# Patient Record
Sex: Male | Born: 1945 | Hispanic: Yes | Smoking: Former smoker
Health system: Southern US, Community
[De-identification: ages and names within clinical notes are randomized; demographics above are authoritative.]

## PROBLEM LIST (undated history)

## (undated) DIAGNOSIS — C801 Malignant (primary) neoplasm, unspecified: Secondary | ICD-10-CM

## (undated) DIAGNOSIS — E119 Type 2 diabetes mellitus without complications: Secondary | ICD-10-CM

---

## 2019-08-15 ENCOUNTER — Encounter: Payer: Self-pay | Admitting: Oncology

## 2019-08-15 ENCOUNTER — Other Ambulatory Visit: Payer: Self-pay | Admitting: Oncology

## 2019-08-25 ENCOUNTER — Emergency Department (HOSPITAL_COMMUNITY)
Admission: EM | Admit: 2019-08-25 | Discharge: 2019-08-25 | Disposition: A | Discharge status: Home / Self Care | Payer: Self-pay | Attending: Emergency Medicine | Admitting: Emergency Medicine

## 2019-08-25 ENCOUNTER — Other Ambulatory Visit: Payer: Self-pay | Admitting: Oncology

## 2019-08-25 ENCOUNTER — Encounter (HOSPITAL_COMMUNITY): Payer: Self-pay | Admitting: Emergency Medicine

## 2019-08-25 ENCOUNTER — Other Ambulatory Visit: Payer: Self-pay

## 2019-08-25 ENCOUNTER — Emergency Department (HOSPITAL_COMMUNITY): Payer: Self-pay

## 2019-08-25 DIAGNOSIS — C169 Malignant neoplasm of stomach, unspecified: Secondary | ICD-10-CM | POA: Insufficient documentation

## 2019-08-25 DIAGNOSIS — Z87891 Personal history of nicotine dependence: Secondary | ICD-10-CM | POA: Insufficient documentation

## 2019-08-25 DIAGNOSIS — Z794 Long term (current) use of insulin: Secondary | ICD-10-CM | POA: Insufficient documentation

## 2019-08-25 DIAGNOSIS — Z8507 Personal history of malignant neoplasm of pancreas: Secondary | ICD-10-CM | POA: Insufficient documentation

## 2019-08-25 DIAGNOSIS — E119 Type 2 diabetes mellitus without complications: Secondary | ICD-10-CM | POA: Insufficient documentation

## 2019-08-25 HISTORY — DX: Malignant (primary) neoplasm, unspecified: C80.1

## 2019-08-25 HISTORY — DX: Type 2 diabetes mellitus without complications: E11.9

## 2019-08-25 LAB — CBC WITH DIFFERENTIAL/PLATELET
Abs Immature Granulocytes: 0.05 10*3/uL (ref 0.00–0.07)
Basophils Absolute: 0 10*3/uL (ref 0.0–0.1)
Basophils Relative: 1 %
Eosinophils Absolute: 0.2 10*3/uL (ref 0.0–0.5)
Eosinophils Relative: 2 %
HCT: 38.8 % — ABNORMAL LOW (ref 39.0–52.0)
Hemoglobin: 12.5 g/dL — ABNORMAL LOW (ref 13.0–17.0)
Immature Granulocytes: 1 %
Lymphocytes Relative: 23 %
Lymphs Abs: 2 10*3/uL (ref 0.7–4.0)
MCH: 27.7 pg (ref 26.0–34.0)
MCHC: 32.2 g/dL (ref 30.0–36.0)
MCV: 85.8 fL (ref 80.0–100.0)
Monocytes Absolute: 0.8 10*3/uL (ref 0.1–1.0)
Monocytes Relative: 9 %
Neutro Abs: 5.6 10*3/uL (ref 1.7–7.7)
Neutrophils Relative %: 64 %
Platelets: 653 10*3/uL — ABNORMAL HIGH (ref 150–400)
RBC: 4.52 MIL/uL (ref 4.22–5.81)
RDW: 13.5 % (ref 11.5–15.5)
WBC: 8.7 10*3/uL (ref 4.0–10.5)
nRBC: 0 % (ref 0.0–0.2)

## 2019-08-25 LAB — COMPREHENSIVE METABOLIC PANEL
ALT: 15 U/L (ref 0–44)
AST: 35 U/L (ref 15–41)
Albumin: 3.1 g/dL — ABNORMAL LOW (ref 3.5–5.0)
Alkaline Phosphatase: 127 U/L — ABNORMAL HIGH (ref 38–126)
Anion gap: 8 (ref 5–15)
BUN: 12 mg/dL (ref 8–23)
CO2: 27 mmol/L (ref 22–32)
Calcium: 8.9 mg/dL (ref 8.9–10.3)
Chloride: 98 mmol/L (ref 98–111)
Creatinine, Ser: 0.7 mg/dL (ref 0.61–1.24)
GFR calc Af Amer: >60 mL/min (ref >60)
GFR calc non Af Amer: >60 mL/min (ref >60)
Glucose, Bld: 132 mg/dL — ABNORMAL HIGH (ref 70–99)
Potassium: 5.2 mmol/L — ABNORMAL HIGH (ref 3.5–5.1)
Sodium: 133 mmol/L — ABNORMAL LOW (ref 135–145)
Total Bilirubin: 0.7 mg/dL (ref 0.3–1.2)
Total Protein: 7.1 g/dL (ref 6.5–8.1)

## 2019-08-25 LAB — LIPASE, BLOOD: Lipase: 38 U/L (ref 11–51)

## 2019-08-25 MED ORDER — SODIUM CHLORIDE (PF) 0.9 % IJ SOLN
INTRAMUSCULAR | Status: AC
  Filled 2019-08-25: qty 50

## 2019-08-25 MED ORDER — SODIUM CHLORIDE 0.9 % IV BOLUS
1000.0000 mL | Freq: Once | INTRAVENOUS | Status: AC
  Administered 2019-08-25: 12:00:00 1000 mL via INTRAVENOUS

## 2019-08-25 MED ORDER — OXYCODONE HCL 5 MG PO TABS: 5.0000 mg | ORAL_TABLET | Freq: Four times a day (QID) | ORAL | 0 refills | Status: DC | PRN

## 2019-08-25 MED ORDER — MORPHINE SULFATE (PF) 4 MG/ML IV SOLN
4.0000 mg | Freq: Once | INTRAVENOUS | Status: AC
  Administered 2019-08-25: 4 mg via INTRAVENOUS
  Filled 2019-08-25: qty 1

## 2019-08-25 MED ORDER — ONDANSETRON HCL 4 MG PO TABS: 4.0000 mg | ORAL_TABLET | Freq: Four times a day (QID) | ORAL | 0 refills | Status: AC

## 2019-08-25 MED ORDER — IOHEXOL 300 MG/ML  SOLN
100.0000 mL | Freq: Once | INTRAMUSCULAR | Status: AC | PRN
  Administered 2019-08-25: 100 mL via INTRAVENOUS

## 2019-08-25 NOTE — Discharge Instructions (Addendum)
Oncology will call you for an appointment this week.  However please give me a phone call if you have not heard back from them.  Numbers attached below.  Please use Tylenol for pain.  Use Roxicodone for breakthrough pain.  Use Zofran for nausea.  These return to the ED for symptoms worsen.

## 2019-08-25 NOTE — ED Triage Notes (Signed)
Pt speaks Spanish used [place]. 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 Didn't receive chemo bc the doctors there said they couldn't.  Came to [place] this past weekend where family is and not getting any treatment in [place].  Pt c/o abd pains with palpation and movement.

## 2019-08-25 NOTE — ED Provider Notes (Signed)
[facility] DEPT Provider Note   CSN: [id number] Arrival date & time: 08/25/19  [id number]     History Chief Complaint  Patient presents with  . Abdominal Pain    Alexander Lawson is a 74 y.o. male.  The history is provided by the patient.  Abdominal Pain Pain location:  Epigastric Pain quality: aching   Pain radiates to:  Does not radiate Pain severity:  Mild Onset quality:  Gradual Timing:  Intermittent Progression:  Waxing and waning Chronicity:  New Context comment:  Diagnosed with gastric cancer per paperwork but patient says pancreatic cancer. Diagnosed last week.  Relieved by:  Nothing Worsened by:  Nothing Associated symptoms: no chest pain, no chills, no constipation, no cough, no diarrhea, no dysuria, no fever, no hematuria, no nausea, no shortness of breath, no sore throat and no vomiting        Past Medical History:  Diagnosis Date  . Cancer ([place])    pancreas  . Diabetes mellitus without complication ([place])     There are no problems to display for this patient.   History reviewed. No pertinent surgical history.     No family history on file.  Social History   Tobacco Use  . Smoking status: Former Research scientist (life sciences)  . Smokeless tobacco: Never Used  Substance Use Topics  . Alcohol use: Not Currently  . Drug use: Not on file    Home Medications Prior to Admission medications   Medication Sig Start Date End Date Taking? Authorizing Provider  insulin NPH Human (NOVOLIN N) 100 UNIT/ML injection Inject 5-10 Units into the skin daily as needed (5 units if BS over 150/10 units if BS over 200).   Yes [provider]  oxymetazoline (AFRIN) 0.05 % nasal spray Place 1 spray into both nostrils 2 (two) times daily as needed for congestion.   Yes [provider]  ondansetron (ZOFRAN) 4 MG tablet Take 1 tablet (4 mg total) by mouth every 6 (six) hours for 20 doses. 08/25/19 08/30/19  Elea Holtzclaw, DO  oxyCODONE (ROXICODONE) 5  MG immediate release tablet Take 1 tablet (5 mg total) by mouth every 6 (six) hours as needed for up to 20 doses for severe pain. 08/25/19   Lennice Sites, DO    Allergies    Patient has no known allergies.  Review of Systems   Review of Systems  Constitutional: Negative for chills and fever.  HENT: Negative for ear pain and sore throat.   Eyes: Negative for pain and visual disturbance.  Respiratory: Negative for cough and shortness of breath.   Cardiovascular: Negative for chest pain and palpitations.  Gastrointestinal: Positive for abdominal pain. Negative for abdominal distention, anal bleeding, blood in stool, constipation, diarrhea, nausea, rectal pain and vomiting.  Genitourinary: Negative for dysuria and hematuria.  Musculoskeletal: Negative for arthralgias and back pain.  Skin: Negative for color change and rash.  Neurological: Negative for seizures and syncope.  All other systems reviewed and are negative.   Physical Exam Updated Vital Signs  ED Triage Vitals [08/25/19 0928]  Enc Vitals Group     BP 114/73     Pulse Rate 97     Resp 14     Temp 98.1 F (36.7 C)     Temp Source Oral     SpO2 98 %     Weight 140 lb (63.5 kg)     Height      Head Circumference      Peak Flow  Pain Score      Pain Loc      Pain Edu?      Excl. in [place]?     Physical Exam Vitals and nursing note reviewed.  Constitutional:      General: He is not in acute distress.    Appearance: He is well-developed. He is not ill-appearing.  HENT:     Head: Normocephalic and atraumatic.  Eyes:     Extraocular Movements: Extraocular movements intact.     Conjunctiva/sclera: Conjunctivae normal.     Pupils: Pupils are equal, round, and reactive to light.  Cardiovascular:     Rate and Rhythm: Normal rate and regular rhythm.     Heart sounds: Normal heart sounds. No murmur.  Pulmonary:     Effort: Pulmonary effort is normal. No respiratory distress.     Breath sounds: Normal breath sounds.   Abdominal:     Palpations: Abdomen is soft.     Tenderness: There is abdominal tenderness in the epigastric area.  Musculoskeletal:     Cervical back: Neck supple.  Skin:    General: Skin is warm and dry.     Capillary Refill: Capillary refill takes less than 2 seconds.  Neurological:     General: No focal deficit present.     Mental Status: He is alert.     ED Results / Procedures / Treatments   Labs (all labs ordered are listed, but only abnormal results are displayed) Labs Reviewed  CBC WITH DIFFERENTIAL/PLATELET - Abnormal; Notable for the following components:      Result Value   Hemoglobin 12.5 (*)    HCT 38.8 (*)    Platelets 653 (*)    All other components within normal limits  COMPREHENSIVE METABOLIC PANEL - Abnormal; Notable for the following components:   Sodium 133 (*)    Potassium 5.2 (*)    Glucose, Bld 132 (*)    Albumin 3.1 (*)    Alkaline Phosphatase 127 (*)    All other components within normal limits  LIPASE, BLOOD    EKG EKG Interpretation  Date/Time:  Monday August 25 2019 11:36:03 EDT Ventricular Rate:  79 PR Interval:    QRS Duration: 97 QT Interval:  401 QTC Calculation: 460 R Axis:   -24 Text Interpretation: Sinus rhythm Borderline left axis deviation Confirmed by Lennice Sites [phone]) on 08/25/2019 11:39:38 AM   Radiology CT ABDOMEN PELVIS W CONTRAST  Result Date: 08/25/2019 CLINICAL DATA:  Abdominal distension and pain. Recently diagnosed with pancreatic cancer in [place]. No reported therapy. History of diabetes. EXAM: CT ABDOMEN AND PELVIS WITH CONTRAST TECHNIQUE: Multidetector CT imaging of the abdomen and pelvis was performed using the standard protocol following bolus administration of intravenous contrast. CONTRAST:  126mL OMNIPAQUE IOHEXOL 300 MG/ML  SOLN COMPARISON:  None. FINDINGS: Lower chest: Mild linear atelectasis or scarring at both lung bases. There is a calcified granuloma in the lingula. No significant pleural or  pericardial effusion. Hepatobiliary: There are multiple metastases throughout all segments of the liver which demonstrate early peripheral enhancement. Representative lesions on series 7 include a 3.2 cm lesion in the anterior dome of the right lobe on image 14, a 3.4 cm lesion in the far lateral segment of the left lobe on image 19, a 4.3 cm lesion in the left lobe on image 21 and a 2.1 cm lesion more inferiorly in the right lobe on image 40. no evidence of gallstones, gallbladder wall thickening or biliary dilatation. Pancreas: There is a very large,  centrally necrotic mass in the left upper quadrant of the abdomen. This measures 12.2 x 9.0 x 8.6 cm and has thick irregular enhancing walls, central low-density and multiple central air bubbles. This mass is located between the liver, stomach and pancreas, and appears inseparable from the lesser curvature of the stomach. This mass also abuts and inferiorly displaces the pancreatic body and tail, but is not favored to represent a primary pancreatic process. There is no pancreatic ductal dilatation. Spleen: Normal in size without focal abnormality. Adrenals/Urinary Tract: Both adrenal glands appear normal. The kidneys appear normal without evidence of urinary tract calculus, suspicious lesion or hydronephrosis. No bladder abnormalities are seen. Stomach/Bowel: As above, large centrally necrotic left upper quadrant mass along the lesser curvature of the stomach, favored to reflect a primary gastric malignancy (likely gastrointestinal stromal tumor). The air within this lesion may be secondary to central necrosis and/or communication with the gastric lumen. The small bowel, appendix and colon appear normal. There is moderate stool throughout the colon. No evidence of bowel obstruction or perforation. Vascular/Lymphatic: There are no enlarged abdominal or pelvic lymph nodes. There are multiple small lymph nodes in the gastrohepatic ligament and retroperitoneum which are  not pathologically enlarged. Mild aortic and branch vessel atherosclerosis. No vascular encasement. A metastasis within the caudate lobe of the liver exerts mild mass effect on the IVC which remains patent. The portal, superior mesenteric, splenic and hepatic veins are patent. Reproductive: Mild enlargement of the prostate gland. Other: Intact anterior abdominal wall. No ascites or peritoneal nodularity. Musculoskeletal: No acute or significant osseous findings. Mild lumbar spondylosis. IMPRESSION: 1. 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 This mass is located between the liver, stomach and pancreas but is not favored to arise from the pancreas. Gas within the lesion may be secondary necrosis or communication with the gastric lumen. 2. Multiple hepatic metastases. Numerous prominent lymph nodes in the upper abdomen, not pathologically enlarged. 3. No evidence of bowel obstruction or perforation. 4. Aortic Atherosclerosis (ICD10-I70.0). Electronically Signed   By: Richardean Sale M.D.   On: 08/25/2019 13:29    Procedures Procedures (including critical care time)  Medications Ordered in ED Medications  sodium chloride (PF) 0.9 % injection (has no administration in time range)  sodium chloride 0.9 % bolus 1,000 mL (0 mLs Intravenous Stopped 08/25/19 1406)  morphine 4 MG/ML injection 4 mg (4 mg Intravenous Given 08/25/19 1150)  iohexol (OMNIPAQUE) 300 MG/ML solution 100 mL (100 mLs Intravenous Contrast Given 08/25/19 1248)    ED Course  I have reviewed the triage vital signs and the nursing notes.  Pertinent labs & imaging results that were available during my care of the patient were reviewed by me and considered in my medical decision making (see chart for details).    MDM Rules/Calculators/A&P                      Alexander Lawson is a 74 year old male with history of diabetes who presents to  the ED with abdominal pain.  Patient with normal vitals.  No fever.  Now relocating from [place].  Has family here in [place].  He was diagnosed last week with according to paperwork that he has with him gastric cancer.  He does not have a treatment plan.  He sounds like he might have been seen by an oncologist who thought about radiation treatment but it sounds like patient has  moved here and prefers treatment locally.  Is able to tolerate liquids but continues to have difficulty eating solids.  Overall he appears well.  Has some mild abdominal tenderness on exam.  We will get basic labs, CT of abdomen and pelvis.  We will look for any electrolyte abnormalities or dehydration.  Will touch base with oncology.  Patient with confirmed intra-abdominal mass.  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  There appears to be hepatic metastasis.  Otherwise lab work is overall unremarkable.  Talked with Dr. Jana Hakim with oncology who will arrange for close follow-up this week.  Patient given prescription for oxycodone and Zofran.  Understands return precautions and discharged in the ED in good condition.  This chart was dictated using voice recognition software.  Despite best efforts to proofread,  errors can occur which can change the documentation meaning.    Final Clinical Impression(s) / ED Diagnoses Final diagnoses:  Malignant neoplasm of stomach, unspecified location [facility])    Rx / DC Orders ED Discharge Orders         Ordered    oxyCODONE (ROXICODONE) 5 MG immediate release tablet  Every 6 hours PRN     08/25/19 1359    ondansetron (ZOFRAN) 4 MG tablet  Every 6 hours     08/25/19 [place], [place], DO 08/25/19 1443

## 2019-08-25 NOTE — ED Notes (Signed)
Pt transported to CT ?

## 2019-08-29 ENCOUNTER — Encounter: Payer: Self-pay | Admitting: Nurse Practitioner

## 2019-08-29 ENCOUNTER — Inpatient Hospital Stay: Payer: Self-pay | Attending: Nurse Practitioner | Admitting: Nurse Practitioner

## 2019-08-29 ENCOUNTER — Other Ambulatory Visit: Payer: Self-pay

## 2019-08-29 ENCOUNTER — Encounter: Payer: Self-pay | Admitting: General Practice

## 2019-08-29 VITALS — BP 97/69 | HR 106 | Temp 99.2°F | Resp 20 | Ht 64.0 in | Wt 122.4 lb

## 2019-08-29 DIAGNOSIS — K769 Liver disease, unspecified: Secondary | ICD-10-CM | POA: Insufficient documentation

## 2019-08-29 DIAGNOSIS — E119 Type 2 diabetes mellitus without complications: Secondary | ICD-10-CM | POA: Insufficient documentation

## 2019-08-29 DIAGNOSIS — K1231 Oral mucositis (ulcerative) due to antineoplastic therapy: Secondary | ICD-10-CM | POA: Insufficient documentation

## 2019-08-29 DIAGNOSIS — G893 Neoplasm related pain (acute) (chronic): Secondary | ICD-10-CM | POA: Insufficient documentation

## 2019-08-29 DIAGNOSIS — C169 Malignant neoplasm of stomach, unspecified: Secondary | ICD-10-CM | POA: Insufficient documentation

## 2019-08-29 DIAGNOSIS — R1310 Dysphagia, unspecified: Secondary | ICD-10-CM | POA: Insufficient documentation

## 2019-08-29 DIAGNOSIS — C160 Malignant neoplasm of cardia: Secondary | ICD-10-CM | POA: Insufficient documentation

## 2019-08-29 DIAGNOSIS — R531 Weakness: Secondary | ICD-10-CM | POA: Insufficient documentation

## 2019-08-29 DIAGNOSIS — R634 Abnormal weight loss: Secondary | ICD-10-CM | POA: Insufficient documentation

## 2019-08-29 DIAGNOSIS — Z7189 Other specified counseling: Secondary | ICD-10-CM | POA: Insufficient documentation

## 2019-08-29 DIAGNOSIS — R160 Hepatomegaly, not elsewhere classified: Secondary | ICD-10-CM | POA: Insufficient documentation

## 2019-08-29 DIAGNOSIS — R1902 Left upper quadrant abdominal swelling, mass and lump: Secondary | ICD-10-CM | POA: Insufficient documentation

## 2019-08-29 DIAGNOSIS — Z5111 Encounter for antineoplastic chemotherapy: Secondary | ICD-10-CM | POA: Insufficient documentation

## 2019-08-29 DIAGNOSIS — R630 Anorexia: Secondary | ICD-10-CM | POA: Insufficient documentation

## 2019-08-29 MED ORDER — HYDROCODONE-ACETAMINOPHEN 7.5-325 MG/15ML PO SOLN: 10.0000 mL | Freq: Four times a day (QID) | ORAL | 0 refills | Status: DC | PRN

## 2019-08-29 MED FILL — HYDROCOD-APAP 7.5-325/15ML: 7.5-325 | 3 days supply | Qty: 120 | Fill #0

## 2019-08-29 NOTE — Progress Notes (Signed)
Met with patient and his daughter in [employer] at initial medical oncology appointment with Ned Card NP and Dr. Benay Spice.  Almyra Free in house interpreter present for visit. They were given my direct phone number and I explained my role as GI nurse navigator.  I had the patient sign of release of information and faxed it to [facility] requesting all of his records from 4/1 through 4/7 be faxed to [place] urgently.  The patient was given some samples of Ensure and coupons to obtain.

## 2019-08-29 NOTE — Progress Notes (Addendum)
New Hematology/Oncology Consult   Requesting MD: Dr. Ronnald Nian  N/A  Reason for Consult: Abdominal mass, liver lesions  HPI: [name] is a 74 year old man who recently located to this area from [place].  He does not speak [place].  He is accompanied by his daughter and a daughter-in-law.  An interpreter is also present for today's visit.  He reports going to [place] a few weeks ago with fever, sweats and abdominal pain.  His daughter describes what sounds like an upper endoscopy with a mass identified.  She thinks a biopsy was performed.  They were told he had stomach cancer.  The records they brought to today's visit include discharge instructions with a discharge diagnosis listed as gastric adenocarcinoma.  There is no biopsy report.  Date of discharge is listed as 08/20/2019.  He states he was unable receive treatment in [place] due to a lack of insurance.   He presented to the emergency department at [facility] on 08/25/2019 with abdominal pain.  CT abdomen/pelvis showed a large centrally necrotic mass in the left upper quadrant of the abdomen located between the liver, stomach and pancreas.  Mass not favored to arise from the pancreas.  There was gas within the lesion possibly secondary to necrosis or communication with the gastric lumen.  Multiple liver lesions were noted as well as numerous prominent lymph nodes in the upper abdomen though not pathologically enlarged.     Past Medical History:  Diagnosis Date  . Cancer ([place])    pancreas  . Diabetes mellitus without complication ([place])   :  History reviewed. No pertinent surgical history.:   Current Outpatient Medications:  .  insulin NPH Human (NOVOLIN N) 100 UNIT/ML injection, Inject 5-10 Units into the skin daily as needed (5 units if BS over 150/10 units if BS over 200)., Disp: , Rfl:  .  ondansetron (ZOFRAN) 4 MG tablet, Take 1 tablet (4 mg total) by mouth every 6 (six) hours for 20 doses., Disp: 20 tablet,  Rfl: 0 .  oxyCODONE (ROXICODONE) 5 MG immediate release tablet, Take 1 tablet (5 mg total) by mouth every 6 (six) hours as needed for up to 20 doses for severe pain., Disp: 20 tablet, Rfl: 0 .  oxymetazoline (AFRIN) 0.05 % nasal spray, Place 1 spray into both nostrils 2 (two) times daily as needed for congestion., Disp: , Rfl: :   No Known Allergies:  FH: No family history of cancer.  SOCIAL HISTORY: He is a widow.  He is currently living in [place] with his daughter.  He has 10 children.  He previously worked driving a truck.  Remote history of tobacco and EtOH use ages [age].  None since then.  Review of Systems: He reports progressive weakness.  Loss of appetite.  Estimates weight loss of 20 pounds.  A year ago he thinks he weighed between 160 and 174 pounds.  No recent fever.  He is having upper abdominal pain.  He has had pain with swallowing as well as difficulty swallowing solids for several weeks.  He is tolerating liquids.  He was prescribed pain medication but does not tolerate pills.  He would like a liquid pain medication.  He notes an alteration in taste.  No unusual headaches.  He notes visual disturbance due to diabetes.  No cough.  He has occasional shortness of breath with deep breathing.  Bowels moving normally.  Mild numbness in the feet.  No numbness in the hands.  Physical Exam:  Blood  pressure 97/69, pulse (!) 106, temperature 99.2 F (37.3 C), temperature source Temporal, resp. rate 20, weight 122 lb 6.4 oz (55.5 kg), SpO2 100 %.  HEENT: Neck without mass.  Sclera anicteric. Lungs: Lungs clear bilaterally. Cardiac: Regular rate and rhythm. Abdomen: Masslike fullness left upper abdomen.  No hepatomegaly.  Vascular: Leg edema. Lymph nodes: No palpable cervical, supraclavicular, inguinal lymph nodes.  Shotty bilateral axillary nodes. Skin: Warm and dry.  LABS:  No results for input(s): WBC, HGB, HCT, PLT in the last 72 hours.  No results for input(s): NA, K, CL,  CO2, GLUCOSE, BUN, CREATININE, CALCIUM in the last 72 hours.    RADIOLOGY:  CT ABDOMEN PELVIS W CONTRAST  Result Date: 08/25/2019 CLINICAL DATA:  Abdominal distension and pain. Recently diagnosed with pancreatic cancer in [place]. No reported therapy. History of diabetes. EXAM: CT ABDOMEN AND PELVIS WITH CONTRAST TECHNIQUE: Multidetector CT imaging of the abdomen and pelvis was performed using the standard protocol following bolus administration of intravenous contrast. CONTRAST:  185m OMNIPAQUE IOHEXOL 300 MG/ML  SOLN COMPARISON:  None. FINDINGS: Lower chest: Mild linear atelectasis or scarring at both lung bases. There is a calcified granuloma in the lingula. No significant pleural or pericardial effusion. Hepatobiliary: There are multiple metastases throughout all segments of the liver which demonstrate early peripheral enhancement. Representative lesions on series 7 include a 3.2 cm lesion in the anterior dome of the right lobe on image 14, a 3.4 cm lesion in the far lateral segment of the left lobe on image 19, a 4.3 cm lesion in the left lobe on image 21 and a 2.1 cm lesion more inferiorly in the right lobe on image 40. no evidence of gallstones, gallbladder wall thickening or biliary dilatation. Pancreas: There is a very large, centrally necrotic mass in the left upper quadrant of the abdomen. This measures 12.2 x 9.0 x 8.6 cm and has thick irregular enhancing walls, central low-density and multiple central air bubbles. This mass is located between the liver, stomach and pancreas, and appears inseparable from the lesser curvature of the stomach. This mass also abuts and inferiorly displaces the pancreatic body and tail, but is not favored to represent a primary pancreatic process. There is no pancreatic ductal dilatation. Spleen: Normal in size without focal abnormality. Adrenals/Urinary Tract: Both adrenal glands appear normal. The kidneys appear normal without evidence of urinary tract calculus,  suspicious lesion or hydronephrosis. No bladder abnormalities are seen. Stomach/Bowel: As above, large centrally necrotic left upper quadrant mass along the lesser curvature of the stomach, favored to reflect a primary gastric malignancy (likely gastrointestinal stromal tumor). The air within this lesion may be secondary to central necrosis and/or communication with the gastric lumen. The small bowel, appendix and colon appear normal. There is moderate stool throughout the colon. No evidence of bowel obstruction or perforation. Vascular/Lymphatic: There are no enlarged abdominal or pelvic lymph nodes. There are multiple small lymph nodes in the gastrohepatic ligament and retroperitoneum which are not pathologically enlarged. Mild aortic and branch vessel atherosclerosis. No vascular encasement. A metastasis within the caudate lobe of the liver exerts mild mass effect on the IVC which remains patent. The portal, superior mesenteric, splenic and hepatic veins are patent. Reproductive: Mild enlargement of the prostate gland. Other: Intact anterior abdominal wall. No ascites or peritoneal nodularity. Musculoskeletal: No acute or significant osseous findings. Mild lumbar spondylosis. IMPRESSION: 1. 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 This  mass is located between the liver, stomach and pancreas but is not favored to arise from the pancreas. Gas within the lesion may be secondary necrosis or communication with the gastric lumen. 2. Multiple hepatic metastases. Numerous prominent lymph nodes in the upper abdomen, not pathologically enlarged. 3. No evidence of bowel obstruction or perforation. 4. Aortic Atherosclerosis (ICD10-I70.0). Electronically Signed   By: Richardean Sale M.D.   On: 08/25/2019 13:29    Assessment and Plan:   1. 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  Multiple liver lesions throughout all segments.  Metastasis within the caudate lobe of the liver exerts mild mass-effect on the IVC which remains patent.  Multiple small lymph nodes in the gastrohepatic ligament and retroperitoneum, not pathologically enlarged. 2. Abdominal pain secondary to #1  3. Dysphagia/odynophagia secondary to #1  4. Anorexia/weight loss secondary to #1  5. Diabetes  Mr. Bushong is a 74 year old man recently relocated to [facility] from [place] where he was found to have a large abdominal mass and liver lesions.  We are trying to obtain the records from [facility] [place].  We are referring him for urgent biopsy of a liver lesion and also placement of a PICC line in anticipation of chemotherapy.  His performance status is poor.  He has lost a significant amount of weight.  He is symptomatic with dysphagia/odynophagia and abdominal pain.  A prescription was sent to his pharmacy for hydrocodone liquid.  We made a referral to the [place] dietitian.  He will return for a follow-up appointment on 09/02/2019 to establish a treatment plan.   Addendum -after leaving the office we received the pathology report from [place].  Biopsy of a gastric cardia mass showed poorly differentiated adenocarcinoma, HER-2 negative, PD1 combined positive score 3.  We canceled the liver biopsy and PICC placement.  He is scheduled for placement of a Port-A-Cath 09/03/2019.  Patient seen with Dr. Benay Spice.  Ned Card, NP 08/29/2019, 11:41 AM   This was a  shared visit with Ned Card.  Mr. Meikle was interviewed and examined.  He presents with dysphagia, weight loss, and abdominal pain.  He underwent an upper endoscopy in [place] with biopsy of the gastric cardia mass confirming adenocarcinoma, HER-2 negative.  We are attempting to obtain full records from the hospital admission to [facility].  Mr. Wintle is symptomatic with pain, odynophagia, dysphagia, and weight loss.  The plan is to begin FOLFOX chemotherapy next week.  Julieanne Manson, MD

## 2019-08-29 NOTE — Progress Notes (Signed)
[facility] CSW Progress Notes  First $50 [employer] provided to patient.  Edwyna Shell, LCSW Clinical Social Worker Phone:  [phone]

## 2019-08-29 NOTE — Progress Notes (Signed)
Received referral from nurse on behalf of provider regarding financial assistance.  Once treatment plan has been established, I will reach out to patient regarding all available resources.  Discovered rx for oral meds placed today.  Sent message to social work to provide visa cards through [employer] to pay for meds. Webb Silversmith responded back and was able to provide. Made Julie(interpreter) aware and she was able to get for patient. Advised her to inform patient I would be meeting with him in person once his plan has been established/chemo ed. They verbalized understanding.

## 2019-08-29 NOTE — Progress Notes (Signed)
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

## 2019-09-01 ENCOUNTER — Other Ambulatory Visit (HOSPITAL_COMMUNITY): Payer: Self-pay

## 2019-09-01 ENCOUNTER — Ambulatory Visit (HOSPITAL_COMMUNITY): Payer: Self-pay

## 2019-09-01 ENCOUNTER — Other Ambulatory Visit: Payer: Self-pay | Admitting: Oncology

## 2019-09-01 DIAGNOSIS — C160 Malignant neoplasm of cardia: Secondary | ICD-10-CM

## 2019-09-01 DIAGNOSIS — R1902 Left upper quadrant abdominal swelling, mass and lump: Secondary | ICD-10-CM

## 2019-09-01 NOTE — Progress Notes (Signed)
ON PATHWAY REGIMEN - Gastroesophageal  No Change  Continue With Treatment as Ordered.     A cycle is every 14 days:     Oxaliplatin      Leucovorin      Fluorouracil      Fluorouracil   **Always confirm dose/schedule in your pharmacy ordering system**  Patient Characteristics: Distant Metastases (cM1/pM1) / Locally Recurrent Disease, Adenocarcinoma - Esophageal, GE Junction, and Gastric, First Line, HER2 Negative/Unknown, PD?L1 Expression  CPS < 5/Negative/Unknown Histology: Adenocarcinoma Disease Classification: Gastric Therapeutic Status: Distant Metastases (No Additional Staging) Line of Therapy: First Line HER2 Status: Negative PD-L1 Expression Status: PD-L1 Expression CPS < 5 Intent of Therapy: Non-Curative / Palliative Intent, Discussed with Patient

## 2019-09-02 ENCOUNTER — Inpatient Hospital Stay: Payer: Self-pay

## 2019-09-02 ENCOUNTER — Other Ambulatory Visit: Payer: Self-pay

## 2019-09-02 ENCOUNTER — Inpatient Hospital Stay (HOSPITAL_BASED_OUTPATIENT_CLINIC_OR_DEPARTMENT_OTHER): Payer: Self-pay | Admitting: Oncology

## 2019-09-02 ENCOUNTER — Other Ambulatory Visit: Payer: Self-pay | Admitting: Radiology

## 2019-09-02 ENCOUNTER — Encounter: Payer: Self-pay | Admitting: Oncology

## 2019-09-02 VITALS — BP 98/71 | HR 111 | Temp 98.9°F | Resp 16 | Ht 68.5 in | Wt 121.0 lb

## 2019-09-02 DIAGNOSIS — C160 Malignant neoplasm of cardia: Secondary | ICD-10-CM

## 2019-09-02 MED ORDER — PROCHLORPERAZINE MALEATE 10 MG PO TABS: 10.0000 mg | ORAL_TABLET | Freq: Four times a day (QID) | ORAL | 1 refills | Status: DC | PRN

## 2019-09-02 MED ORDER — LIDOCAINE-PRILOCAINE 2.5-2.5 % EX CREA: 1.0000 "application " | TOPICAL_CREAM | CUTANEOUS | 0 refills | Status: AC

## 2019-09-02 MED FILL — LIDOCAINE-PRILOCAINE CREAM: 2.5-2.5 | 5 days supply | Qty: 30 | Fill #0

## 2019-09-02 MED FILL — PROCHLORPERAZINE 10 MG TAB: 10 | 11 days supply | Qty: 45 | Fill #0

## 2019-09-02 NOTE — Progress Notes (Signed)
  [facility] OFFICE PROGRESS NOTE   Diagnosis: Gastric cancer  INTERVAL HISTORY:   Alexander Lawson returns as scheduled.  He is here today with his daughter and a Spanish interpreter.  He continues to have dysphagia, upper abdominal discomfort, and he reports "a hot feeling "when he has a bowel movement.  He is tolerating liquids.  He attended a chemotherapy class today.  Objective:  Vital signs in last 24 hours:  Blood pressure 98/71, pulse (!) 111, temperature 98.9 F (37.2 C), temperature source Temporal, resp. rate 16, height 5' 8.5" (1.74 m), weight 121 lb (54.9 kg), SpO2 100 %.   Physical examination-not performed today  Lab Results:  Lab Results  Component Value Date   WBC 8.7 08/25/2019   HGB 12.5 (L) 08/25/2019   HCT 38.8 (L) 08/25/2019   MCV 85.8 08/25/2019   PLT 653 (H) 08/25/2019   NEUTROABS 5.6 08/25/2019    CMP  Lab Results  Component Value Date   NA 133 (L) 08/25/2019   K 5.2 (H) 08/25/2019   CL 98 08/25/2019   CO2 27 08/25/2019   GLUCOSE 132 (H) 08/25/2019   BUN 12 08/25/2019   CREATININE 0.70 08/25/2019   CALCIUM 8.9 08/25/2019   PROT 7.1 08/25/2019   ALBUMIN 3.1 (L) 08/25/2019   AST 35 08/25/2019   ALT 15 08/25/2019   ALKPHOS 127 (H) 08/25/2019   BILITOT 0.7 08/25/2019   GFRNONAA >60 08/25/2019   GFRAA >60 08/25/2019     Medications: I have reviewed the patient's current medications.   Assessment/Plan:  1. Metastatic gastric cancer  Biopsy gastric cardiac mass 08/15/2019-adenocarcinoma, poorly differentiated; HER-2 negative, PD1 combined positive score 3  CT abdomen/pelvis August 25, 2019-large centrally necrotic mass left upper quadrant of the abdomen measuring 12.2 x 9.0 x 8.6 cm, thick irregular enhancing walls, central low density and multiple central air bubbles; the mass is located between the liver, stomach and pancreas and appears inseparable from the lesser curvature of the stomach.  Multiple liver lesions throughout  all segments.  Metastasis within the caudate lobe of the liver exerts mild mass-effect on the IVC which remains patent.  Multiple small lymph nodes in the gastrohepatic ligament and retroperitoneum, not pathologically enlarged. 2. Abdominal pain secondary to #1  3. Dysphagia/odynophagia secondary to #1  4. Anorexia/weight loss secondary to #1  5. Diabetes  Disposition: Alexander Lawson appears unchanged.  He attended a chemotherapy teaching class today.  He has been diagnosed with metastatic gastric cancer.  We are waiting on records from the hospital in [place] to include the upper endoscopy report.  Alexander Lawson will undergo Port-A-Cath placement tomorrow.  The plan is to begin systemic therapy on 09/04/2019.  I recommend treatment with FOLFOX/nivolumab.  We reviewed potential toxicities associated with this regimen with the aid of a Spanish interpreter.  He understands the potential for nausea/vomiting, mucositis, diarrhea, alopecia, and hematologic toxicity.  We discussed the allergic reaction and various types of neuropathy associated with oxaliplatin.  We discussed the rash, diarrhea, and autoimmune toxicities of nivolumab.  He agrees to proceed.  Nivolumab will be held with the first cycle of treatment while he waits on enrollment in a drug assistance program.  Alexander Lawson will return for an office visit next week.  He will be scheduled for an office visit and cycle 2 FOLFOX with nivolumab on 09/18/2019.  Alexander Coder, MD  09/02/2019  2:56 PM

## 2019-09-02 NOTE — Progress Notes (Signed)
Met with patient and family members along with interpreter to introduce myself as Arboriculturist and to offer available resources.  Discussed one-time $1000 Radio broadcast assistant to assist with personal expenses while going through treatment.  Gave him my card if interested in appllying and for any additional financial questions or concerns.   Went back to class to provide a folder with detailed information regarding expenses that can be covered by grant and gave a financial assistance application to apply for any more than the automatic 56% discount for being uninsured.

## 2019-09-03 ENCOUNTER — Other Ambulatory Visit: Payer: Self-pay

## 2019-09-03 ENCOUNTER — Encounter (HOSPITAL_COMMUNITY): Payer: Self-pay

## 2019-09-03 ENCOUNTER — Other Ambulatory Visit: Payer: Self-pay | Admitting: Nurse Practitioner

## 2019-09-03 ENCOUNTER — Ambulatory Visit (HOSPITAL_COMMUNITY)
Admission: RE | Admit: 2019-09-03 | Discharge: 2019-09-03 | Disposition: A | Discharge status: Home / Self Care | Payer: Self-pay | Source: Ambulatory Visit | Attending: Oncology | Admitting: Oncology

## 2019-09-03 ENCOUNTER — Ambulatory Visit (HOSPITAL_COMMUNITY)
Admission: RE | Admit: 2019-09-03 | Discharge: 2019-09-03 | Disposition: A | Discharge status: Home / Self Care | Payer: Self-pay | Source: Ambulatory Visit | Attending: Nurse Practitioner | Admitting: Nurse Practitioner

## 2019-09-03 DIAGNOSIS — R160 Hepatomegaly, not elsewhere classified: Secondary | ICD-10-CM | POA: Insufficient documentation

## 2019-09-03 DIAGNOSIS — Z87891 Personal history of nicotine dependence: Secondary | ICD-10-CM | POA: Insufficient documentation

## 2019-09-03 DIAGNOSIS — C259 Malignant neoplasm of pancreas, unspecified: Secondary | ICD-10-CM | POA: Insufficient documentation

## 2019-09-03 DIAGNOSIS — R1902 Left upper quadrant abdominal swelling, mass and lump: Secondary | ICD-10-CM

## 2019-09-03 DIAGNOSIS — Z794 Long term (current) use of insulin: Secondary | ICD-10-CM | POA: Insufficient documentation

## 2019-09-03 DIAGNOSIS — E119 Type 2 diabetes mellitus without complications: Secondary | ICD-10-CM | POA: Insufficient documentation

## 2019-09-03 HISTORY — PX: IR IMAGING GUIDED PORT INSERTION: IMG5740

## 2019-09-03 LAB — BASIC METABOLIC PANEL
Anion gap: 12 (ref 5–15)
BUN: 18 mg/dL (ref 8–23)
CO2: 24 mmol/L (ref 22–32)
Calcium: 8.9 mg/dL (ref 8.9–10.3)
Chloride: 94 mmol/L — ABNORMAL LOW (ref 98–111)
Creatinine, Ser: 0.75 mg/dL (ref 0.61–1.24)
GFR calc Af Amer: >60 mL/min (ref >60)
GFR calc non Af Amer: >60 mL/min (ref >60)
Glucose, Bld: 117 mg/dL — ABNORMAL HIGH (ref 70–99)
Potassium: 4.4 mmol/L (ref 3.5–5.1)
Sodium: 130 mmol/L — ABNORMAL LOW (ref 135–145)

## 2019-09-03 LAB — CBC WITH DIFFERENTIAL/PLATELET
Abs Immature Granulocytes: 0.06 10*3/uL (ref 0.00–0.07)
Basophils Absolute: 0.1 10*3/uL (ref 0.0–0.1)
Basophils Relative: 1 %
Eosinophils Absolute: 0.2 10*3/uL (ref 0.0–0.5)
Eosinophils Relative: 1 %
HCT: 40.9 % (ref 39.0–52.0)
Hemoglobin: 13.3 g/dL (ref 13.0–17.0)
Immature Granulocytes: 0 %
Lymphocytes Relative: 12 %
Lymphs Abs: 1.7 10*3/uL (ref 0.7–4.0)
MCH: 27.7 pg (ref 26.0–34.0)
MCHC: 32.5 g/dL (ref 30.0–36.0)
MCV: 85 fL (ref 80.0–100.0)
Monocytes Absolute: 1.1 10*3/uL — ABNORMAL HIGH (ref 0.1–1.0)
Monocytes Relative: 8 %
Neutro Abs: 10.9 10*3/uL — ABNORMAL HIGH (ref 1.7–7.7)
Neutrophils Relative %: 78 %
Platelets: 423 10*3/uL — ABNORMAL HIGH (ref 150–400)
RBC: 4.81 MIL/uL (ref 4.22–5.81)
RDW: 13.6 % (ref 11.5–15.5)
WBC: 14 10*3/uL — ABNORMAL HIGH (ref 4.0–10.5)
nRBC: 0 % (ref 0.0–0.2)

## 2019-09-03 LAB — GLUCOSE, CAPILLARY: Glucose-Capillary: 107 mg/dL — ABNORMAL HIGH (ref 70–99)

## 2019-09-03 LAB — PROTIME-INR
INR: 1.1 (ref 0.8–1.2)
Prothrombin Time: 14 seconds (ref 11.4–15.2)

## 2019-09-03 MED ORDER — HEPARIN SOD (PORK) LOCK FLUSH 100 UNIT/ML IV SOLN
INTRAVENOUS | Status: AC | PRN
  Administered 2019-09-03: 500 [IU] via INTRAVENOUS

## 2019-09-03 MED ORDER — HEPARIN SOD (PORK) LOCK FLUSH 100 UNIT/ML IV SOLN
INTRAVENOUS | Status: AC
  Filled 2019-09-03: qty 5

## 2019-09-03 MED ORDER — CEFAZOLIN SODIUM-DEXTROSE 2-4 GM/100ML-% IV SOLN
INTRAVENOUS | Status: AC
  Administered 2019-09-03: 11:00:00 2 g via INTRAVENOUS
  Filled 2019-09-03: qty 100

## 2019-09-03 MED ORDER — FENTANYL CITRATE (PF) 100 MCG/2ML IJ SOLN
INTRAMUSCULAR | Status: AC | PRN
  Administered 2019-09-03: 50 ug via INTRAVENOUS

## 2019-09-03 MED ORDER — SODIUM CHLORIDE 0.9 % IV SOLN: INTRAVENOUS | Status: DC

## 2019-09-03 MED ORDER — CEFAZOLIN SODIUM-DEXTROSE 2-4 GM/100ML-% IV SOLN: 2.0000 g | INTRAVENOUS | Status: AC

## 2019-09-03 MED ORDER — FENTANYL CITRATE (PF) 100 MCG/2ML IJ SOLN
INTRAMUSCULAR | Status: AC
  Filled 2019-09-03: qty 2

## 2019-09-03 MED ORDER — LIDOCAINE-EPINEPHRINE (PF) 1 %-1:200000 IJ SOLN
INTRAMUSCULAR | Status: AC | PRN
  Administered 2019-09-03: 10 mL

## 2019-09-03 MED ORDER — MIDAZOLAM HCL 2 MG/2ML IJ SOLN
INTRAMUSCULAR | Status: AC | PRN
  Administered 2019-09-03: 1 mg via INTRAVENOUS

## 2019-09-03 MED ORDER — MIDAZOLAM HCL 2 MG/2ML IJ SOLN
INTRAMUSCULAR | Status: AC
  Filled 2019-09-03: qty 4

## 2019-09-03 MED ORDER — LIDOCAINE-EPINEPHRINE 1 %-1:100000 IJ SOLN
INTRAMUSCULAR | Status: AC
  Filled 2019-09-03: qty 1

## 2019-09-03 NOTE — Consult Note (Signed)
Chief Complaint: Patient was seen in consultation today for Port-A-Cath placement  Referring Physician(s): Moyock B  Supervising Physician: Jacqulynn Cadet  Patient Status: [facility] - Out-pt  History of Present Illness: Alexander Lawson is a 74 y.o. male with history of newly diagnosed gastroesophageal carcinoma with liver lesions who presents today for Port-A-Cath placement for chemotherapy.  Past Medical History:  Diagnosis Date  . Cancer ([place])    pancreas  . Diabetes mellitus without complication ([place])     No past surgical history on file.  Allergies: Patient has no known allergies.  Medications: Prior to Admission medications   Medication Sig Start Date End Date Taking? Authorizing Provider  Acetaminophen (TYLENOL) 325 MG CAPS Take 650 mg by mouth as needed.    [provider]  HYDROcodone-acetaminophen (HYCET) 7.5-325 mg/15 ml solution Take 10 mLs by mouth every 6 (six) hours as needed for moderate pain. Patient not taking: Reported on 09/02/2019 08/29/19 08/28/20  Owens Shark, NP  insulin NPH Human (NOVOLIN N) 100 UNIT/ML injection Inject 5-10 Units into the skin daily as needed (5 units if BS over 150/10 units if BS over 200).    [provider]  lidocaine-prilocaine (EMLA) cream Apply 1 application topically as directed. Apply to port site 1 hour prior to stick and cover with plastic wrap. Start with 2nd chemo treatment 09/02/19   Ladell Pier, MD  oxyCODONE (ROXICODONE) 5 MG immediate release tablet Take 1 tablet (5 mg total) by mouth every 6 (six) hours as needed for up to 20 doses for severe pain. Patient not taking: Reported on 09/02/2019 08/25/19   Lennice Sites, DO  oxymetazoline (AFRIN) 0.05 % nasal spray Place 1 spray into both nostrils 2 (two) times daily as needed for congestion.    [provider]  prochlorperazine (COMPAZINE) 10 MG tablet Take 1 tablet (10 mg total) by mouth every 6 (six) hours as needed for nausea.  09/02/19   Ladell Pier, MD     No family history on file.  Social History   Socioeconomic History  . Marital status: Widowed    Spouse name: Not on file  . Number of children: Not on file  . Years of education: Not on file  . Highest education level: Not on file  Occupational History  . Not on file  Tobacco Use  . Smoking status: Former Research scientist (life sciences)  . Smokeless tobacco: Never Used  Substance and Sexual Activity  . Alcohol use: Not Currently  . Drug use: Not on file  . Sexual activity: Not on file  Other Topics Concern  . Not on file  Social History Narrative  . Not on file   Social Determinants of Health   Financial Resource Strain:   . Difficulty of Paying Living Expenses:   Food Insecurity:   . Worried About Charity fundraiser in the Last Year:   . Arboriculturist in the Last Year:   Transportation Needs:   . Film/video editor (Medical):   Marland Kitchen Lack of Transportation (Non-Medical):   Physical Activity:   . Days of Exercise per Week:   . Minutes of Exercise per Session:   Stress:   . Feeling of Stress :   Social Connections:   . Frequency of Communication with Friends and Family:   . Frequency of Social Gatherings with Friends and Family:   . Attends Religious Services:   . Active Member of Clubs or Organizations:   . Attends Archivist Meetings:   .  Marital Status:       Review of Systems: currently denies fever, headache, chest pain, dyspnea, cough, or bleeding.  He does have some intermittent abdominal/back pain, dysphagia,  occasional nausea and vomiting and weight loss.  Vital Signs: BP 120/76   Pulse 93   Temp 98.5 F (36.9 C) (Oral)   Resp 18   SpO2 100%   Physical Exam awake, alert.  Chest clear to auscultation bilaterally.  Heart with regular rate and rhythm.  Abdomen soft, positive bowel sounds, some mild epigastric tenderness to palpation.  No lower extremity edema.  Imaging: CT ABDOMEN PELVIS W CONTRAST  Result Date:  08/25/2019 CLINICAL DATA:  Abdominal distension and pain. Recently diagnosed with pancreatic cancer in [place]. No reported therapy. History of diabetes. EXAM: CT ABDOMEN AND PELVIS WITH CONTRAST TECHNIQUE: Multidetector CT imaging of the abdomen and pelvis was performed using the standard protocol following bolus administration of intravenous contrast. CONTRAST:  156mL OMNIPAQUE IOHEXOL 300 MG/ML  SOLN COMPARISON:  None. FINDINGS: Lower chest: Mild linear atelectasis or scarring at both lung bases. There is a calcified granuloma in the lingula. No significant pleural or pericardial effusion. Hepatobiliary: There are multiple metastases throughout all segments of the liver which demonstrate early peripheral enhancement. Representative lesions on series 7 include a 3.2 cm lesion in the anterior dome of the right lobe on image 14, a 3.4 cm lesion in the far lateral segment of the left lobe on image 19, a 4.3 cm lesion in the left lobe on image 21 and a 2.1 cm lesion more inferiorly in the right lobe on image 40. no evidence of gallstones, gallbladder wall thickening or biliary dilatation. Pancreas: There is a very large, centrally necrotic mass in the left upper quadrant of the abdomen. This measures 12.2 x 9.0 x 8.6 cm and has thick irregular enhancing walls, central low-density and multiple central air bubbles. This mass is located between the liver, stomach and pancreas, and appears inseparable from the lesser curvature of the stomach. This mass also abuts and inferiorly displaces the pancreatic body and tail, but is not favored to represent a primary pancreatic process. There is no pancreatic ductal dilatation. Spleen: Normal in size without focal abnormality. Adrenals/Urinary Tract: Both adrenal glands appear normal. The kidneys appear normal without evidence of urinary tract calculus, suspicious lesion or hydronephrosis. No bladder abnormalities are seen. Stomach/Bowel: As above, large centrally necrotic left  upper quadrant mass along the lesser curvature of the stomach, favored to reflect a primary gastric malignancy (likely gastrointestinal stromal tumor). The air within this lesion may be secondary to central necrosis and/or communication with the gastric lumen. The small bowel, appendix and colon appear normal. There is moderate stool throughout the colon. No evidence of bowel obstruction or perforation. Vascular/Lymphatic: There are no enlarged abdominal or pelvic lymph nodes. There are multiple small lymph nodes in the gastrohepatic ligament and retroperitoneum which are not pathologically enlarged. Mild aortic and branch vessel atherosclerosis. No vascular encasement. A metastasis within the caudate lobe of the liver exerts mild mass effect on the IVC which remains patent. The portal, superior mesenteric, splenic and hepatic veins are patent. Reproductive: Mild enlargement of the prostate gland. Other: Intact anterior abdominal wall. No ascites or peritoneal nodularity. Musculoskeletal: No acute or significant osseous findings. Mild lumbar spondylosis. IMPRESSION: 1. 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 This mass is located between the liver, stomach and pancreas but is not  favored to arise from the pancreas. Gas within the lesion may be secondary necrosis or communication with the gastric lumen. 2. Multiple hepatic metastases. Numerous prominent lymph nodes in the upper abdomen, not pathologically enlarged. 3. No evidence of bowel obstruction or perforation. 4. Aortic Atherosclerosis (ICD10-I70.0). Electronically Signed   By: Richardean Sale M.D.   On: 08/25/2019 13:29    Labs:  CBC: Recent Labs    08/25/19 1115 09/03/19 0940  WBC 8.7 14.0*  HGB 12.5* 13.3  HCT 38.8* 40.9  PLT 653* 423*    COAGS: No results for input(s): INR, APTT in the last 8760 hours.  BMP: Recent Labs     08/25/19 1115  NA 133*  K 5.2*  CL 98  CO2 27  GLUCOSE 132*  BUN 12  CALCIUM 8.9  CREATININE 0.70  GFRNONAA >60  GFRAA >60    LIVER FUNCTION TESTS: Recent Labs    08/25/19 1115  BILITOT 0.7  AST 35  ALT 15  ALKPHOS 127*  PROT 7.1  ALBUMIN 3.1*    TUMOR MARKERS: No results for input(s): AFPTM, CEA, CA199, CHROMGRNA in the last 8760 hours.  Assessment and Plan: 74 y.o. male with history of newly diagnosed gastroesophageal carcinoma with liver lesions who presents today for Port-A-Cath placement for chemotherapy.Risks and benefits of image guided port-a-catheter placement was discussed with the patient via interpreter including, but not limited to bleeding, infection, pneumothorax, or fibrin sheath development and need for additional procedures.  All of the patient's questions were answered, patient is agreeable to proceed. Consent signed and in chart.  Oncology aware of patient's current WBC of 14; okay to proceed with port placement.   Thank you for this interesting consult.  I greatly enjoyed meeting Alexander Lawson and look forward to participating in their care.  A copy of this report was sent to the requesting provider on this date.  Electronically Signed: D. Rowe Robert, PA-C 09/03/2019, 9:47 AM   I spent a total of  25 minutes   in face to face in clinical consultation, greater than 50% of which was counseling/coordinating care for Port-A-Cath placement

## 2019-09-03 NOTE — Progress Notes (Signed)
Pharmacist Chemotherapy Monitoring - Initial Assessment    Anticipated start date: 09/04/19   Regimen:  . Are orders appropriate based on the patient's diagnosis, regimen, and cycle? Yes . Does the plan date match the patient's scheduled date? Yes . Is the sequencing of drugs appropriate? Yes . Are the premedications appropriate for the patient's regimen? Yes . Prior Authorization for treatment is: Uninsured - patient assistance for nivolumab to be initiated at first treatment  o If applicable, is the correct biosimilar selected based on the patient's insurance? not applicable  Organ Function and Labs: Marland Kitchen Are dose adjustments needed based on the patient's renal function, hepatic function, or hematologic function? No . Are appropriate labs ordered prior to the start of patient's treatment? Yes . Other organ system assessment, if indicated: N/A . The following baseline labs, if indicated, have been ordered: nivolumab: baseline TSH +/- T4  Dose Assessment: . Are the drug doses appropriate? Yes . Are the following correct: o Drug concentrations Yes o IV fluid compatible with drug Yes o Administration routes Yes o Timing of therapy Yes . If applicable, does the patient have documented access for treatment and/or plans for port-a-cath placement? yes . If applicable, have lifetime cumulative doses been properly documented and assessed? not applicable Lifetime Dose Tracking  No doses have been documented on this patient for the following tracked chemicals: Doxorubicin, Epirubicin, Idarubicin, Daunorubicin, Mitoxantrone, Bleomycin, Oxaliplatin, Carboplatin, Liposomal Doxorubicin  o   Toxicity Monitoring/Prevention: . The patient has the following take home antiemetics prescribed: Prochlorperazine . The patient has the following take home medications prescribed: N/A . Medication allergies and previous infusion related reactions, if applicable, have been reviewed and addressed. Yes . The  patient's current medication list has been assessed for drug-drug interactions with their chemotherapy regimen. no significant drug-drug interactions were identified on review.  Order Review: . Are the treatment plan orders signed? Yes . Is the patient scheduled to see a provider prior to their treatment? Yes  I verify that I have reviewed each item in the above checklist and answered each question accordingly.  [place]wood Levo [facility] 09/03/2019 9:03 AM

## 2019-09-03 NOTE — Discharge Instructions (Signed)
Please call Interventional Radiology clinic [phone] with any questions or concerns about your port.  You may remove your dressing and shower tomorrow.  DO NOT use EMLA cream for 2 weeks after port placement as this cream will remove surgical glue on your incision.  Moderate Conscious Sedation, Adult, Care After These instructions provide you with information about caring for yourself after your procedure. Your health care provider may also give you more specific instructions. Your treatment has been planned according to current medical practices, but problems sometimes occur. Call your health care provider if you have any problems or questions after your procedure. What can I expect after the procedure? After your procedure, it is common:  To feel sleepy for several hours.  To feel clumsy and have poor balance for several hours.  To have poor judgment for several hours.  To vomit if you eat too soon. Follow these instructions at home: For at least 24 hours after the procedure:   Do not: ? Participate in activities where you could fall or become injured. ? Drive. ? Use heavy machinery. ? Drink alcohol. ? Take sleeping pills or medicines that cause drowsiness. ? Make important decisions or sign legal documents. ? Take care of children on your own.  Rest. Eating and drinking  Follow the diet recommended by your health care provider.  If you vomit: ? Drink water, juice, or soup when you can drink without vomiting. ? Make sure you have little or no nausea before eating solid foods. General instructions  Have a responsible adult stay with you until you are awake and alert.  Take over-the-counter and prescription medicines only as told by your health care provider.  If you smoke, do not smoke without supervision.  Keep all follow-up visits as told by your health care provider. This is important. Contact a health care provider if:  You keep feeling nauseous or you keep  vomiting.  You feel light-headed.  You develop a rash.  You have a fever. Get help right away if:  You have trouble breathing. This information is not intended to replace advice given to you by your health care provider. Make sure you discuss any questions you have with your health care provider. Document Revised: 04/13/2017 Document Reviewed: 08/21/2015 Elsevier Patient Education  [place] Insertion, Care After This sheet gives you information about how to care for yourself after your procedure. Your health care provider may also give you more specific instructions. If you have problems or questions, contact your health care provider. What can I expect after the procedure? After the procedure, it is common to have:  Discomfort at the port insertion site.  Bruising on the skin over the port. This should improve over 3-4 days. Follow these instructions at home: [facility] care  After your port is placed, you will get a manufacturer's information card. The card has information about your port. Keep this card with you at all times.  Take care of the port as told by your health care provider. Ask your health care provider if you or a family member can get training for taking care of the port at home. A home health care nurse may also take care of the port.  Make sure to remember what type of port you have. Incision care      Follow instructions from your health care provider about how to take care of your port insertion site. Make sure you: ? Wash your hands with soap and  water before and after you change your bandage (dressing). If soap and water are not available, use hand sanitizer. ? Change your dressing as told by your health care provider. ? Leave stitches (sutures), skin glue, or adhesive strips in place. These skin closures may need to stay in place for 2 weeks or longer. If adhesive strip edges start to loosen and curl up, you may trim the loose  edges. Do not remove adhesive strips completely unless your health care provider tells you to do that.  Check your port insertion site every day for signs of infection. Check for: ? Redness, swelling, or pain. ? Fluid or blood. ? Warmth. ? Pus or a bad smell. Activity  Return to your normal activities as told by your health care provider. Ask your health care provider what activities are safe for you.  Do not lift anything that is heavier than 10 lb (4.5 kg), or the limit that you are told, until your health care provider says that it is safe. General instructions  Take over-the-counter and prescription medicines only as told by your health care provider.  Do not take baths, swim, or use a hot tub until your health care provider approves. Ask your health care provider if you may take showers. You may only be allowed to take sponge baths.  Do not drive for 24 hours if you were given a sedative during your procedure.  Wear a medical alert bracelet in case of an emergency. This will tell any health care providers that you have a port.  Keep all follow-up visits as told by your health care provider. This is important. Contact a health care provider if:  You cannot flush your port with saline as directed, or you cannot draw blood from the port.  You have a fever or chills.  You have redness, swelling, or pain around your port insertion site.  You have fluid or blood coming from your port insertion site.  Your port insertion site feels warm to the touch.  You have pus or a bad smell coming from the port insertion site. Get help right away if:  You have chest pain or shortness of breath.  You have bleeding from your port that you cannot control. Summary  Take care of the port as told by your health care provider. Keep the manufacturer's information card with you at all times.  Change your dressing as told by your health care provider.  Contact a health care provider if you  have a fever or chills or if you have redness, swelling, or pain around your port insertion site.  Keep all follow-up visits as told by your health care provider. This information is not intended to replace advice given to you by your health care provider. Make sure you discuss any questions you have with your health care provider. Document Revised: 11/27/2017 Document Reviewed: 11/27/2017 Elsevier Patient Education  [place].

## 2019-09-03 NOTE — Progress Notes (Signed)
Faxed request to pathologist at [facility] requesting slides and blocks for this patient be sent to [facility]ology for review.  Received confirmation fax went through.

## 2019-09-04 ENCOUNTER — Inpatient Hospital Stay: Payer: Self-pay

## 2019-09-04 ENCOUNTER — Inpatient Hospital Stay: Payer: Self-pay | Admitting: Nutrition

## 2019-09-04 ENCOUNTER — Other Ambulatory Visit: Payer: Self-pay

## 2019-09-04 VITALS — BP 102/73 | HR 94 | Temp 98.7°F | Resp 18 | Ht 68.5 in | Wt 121.0 lb

## 2019-09-04 DIAGNOSIS — C160 Malignant neoplasm of cardia: Secondary | ICD-10-CM

## 2019-09-04 DIAGNOSIS — Z95828 Presence of other vascular implants and grafts: Secondary | ICD-10-CM

## 2019-09-04 DIAGNOSIS — R160 Hepatomegaly, not elsewhere classified: Secondary | ICD-10-CM

## 2019-09-04 LAB — TSH: TSH: 8.534 u[IU]/mL — ABNORMAL HIGH (ref 0.320–4.118)

## 2019-09-04 LAB — CEA (IN HOUSE-CHCC): CEA (CHCC-In House): 7.8 ng/mL — ABNORMAL HIGH (ref 0.00–5.00)

## 2019-09-04 LAB — CMP (CANCER CENTER ONLY)
ALT: 17 U/L (ref 0–44)
AST: 49 U/L — ABNORMAL HIGH (ref 15–41)
Albumin: 2.7 g/dL — ABNORMAL LOW (ref 3.5–5.0)
Alkaline Phosphatase: 175 U/L — ABNORMAL HIGH (ref 38–126)
Anion gap: 9 (ref 5–15)
BUN: 14 mg/dL (ref 8–23)
CO2: 24 mmol/L (ref 22–32)
Calcium: 8.9 mg/dL (ref 8.9–10.3)
Chloride: 97 mmol/L — ABNORMAL LOW (ref 98–111)
Creatinine: 0.68 mg/dL (ref 0.61–1.24)
GFR, Est AFR Am: >60 mL/min (ref >60)
GFR, Estimated: >60 mL/min (ref >60)
Glucose, Bld: 167 mg/dL — ABNORMAL HIGH (ref 70–99)
Potassium: 4.5 mmol/L (ref 3.5–5.1)
Sodium: 130 mmol/L — ABNORMAL LOW (ref 135–145)
Total Bilirubin: 0.4 mg/dL (ref 0.3–1.2)
Total Protein: 6.8 g/dL (ref 6.5–8.1)

## 2019-09-04 MED ORDER — FLUOROURACIL CHEMO INJECTION 2.5 GM/50ML
400.0000 mg/m2 | Freq: Once | INTRAVENOUS | Status: AC
  Administered 2019-09-04: 13:00:00 650 mg via INTRAVENOUS
  Filled 2019-09-04: qty 13

## 2019-09-04 MED ORDER — PALONOSETRON HCL INJECTION 0.25 MG/5ML
0.2500 mg | Freq: Once | INTRAVENOUS | Status: AC
  Administered 2019-09-04: 0.25 mg via INTRAVENOUS

## 2019-09-04 MED ORDER — OXALIPLATIN CHEMO INJECTION 100 MG/20ML
85.0000 mg/m2 | Freq: Once | INTRAVENOUS | Status: AC
  Administered 2019-09-04: 140 mg via INTRAVENOUS
  Filled 2019-09-04: qty 20

## 2019-09-04 MED ORDER — LEUCOVORIN CALCIUM INJECTION 350 MG
400.0000 mg/m2 | Freq: Once | INTRAVENOUS | Status: AC
  Administered 2019-09-04: 11:00:00 652 mg via INTRAVENOUS
  Filled 2019-09-04: qty 32.6

## 2019-09-04 MED ORDER — SODIUM CHLORIDE 0.9% FLUSH
10.0000 mL | INTRAVENOUS | Status: DC | PRN
  Administered 2019-09-04: 10 mL via INTRAVENOUS
  Filled 2019-09-04: qty 10

## 2019-09-04 MED ORDER — SODIUM CHLORIDE 0.9 % IV SOLN
10.0000 mg | Freq: Once | INTRAVENOUS | Status: AC
  Administered 2019-09-04: 10 mg via INTRAVENOUS
  Filled 2019-09-04: qty 10

## 2019-09-04 MED ORDER — PALONOSETRON HCL INJECTION 0.25 MG/5ML
INTRAVENOUS | Status: AC
  Filled 2019-09-04: qty 5

## 2019-09-04 MED ORDER — DEXTROSE 5 % IV SOLN
Freq: Once | INTRAVENOUS | Status: AC
  Filled 2019-09-04: qty 250

## 2019-09-04 MED ORDER — SODIUM CHLORIDE 0.9 % IV SOLN
2400.0000 mg/m2 | INTRAVENOUS | Status: DC
  Administered 2019-09-04: 13:00:00 3900 mg via INTRAVENOUS
  Filled 2019-09-04: qty 78

## 2019-09-04 NOTE — Patient Instructions (Signed)

## 2019-09-04 NOTE — Progress Notes (Signed)
74 year old male diagnosed with gastric cancer receiving FOLFOX/nivolumab and is a patient of Dr. Benay Spice.  Past medical history includes diabetes and pancreas cancer.  Medications include insulin, Zofran, and Compazine.  Labs include sodium 130 and glucose 117 on April 21.  Height: 68.5 inches. Weight: 121 pounds. Usual body weight: 160 pounds per patient. BMI: 18.13.  I met with patient and Spanish interpreter. Patient reports dysphagia especially with solid foods.  He denies difficulty with liquids.  He also can tolerate soft mashed foods. He admits to approximate 40 pound weight loss. Reports taste alterations. Reports smell of food causes him to not want to eat meals. He lives with daughter who cooks for him. Reportedly likes milk and has been drinking Ensure Enlive.  Nutrition diagnosis: Unintended weight loss related to gastric cancer as evidenced by 24% weight loss from usual body weight.  This is significant.  Intervention: Provided education on the importance of small, frequent meals and snacks with high-calorie, high-protein foods. Educated patient on strategies for eating with dysphagia. Encouraged strategies for improving taste. Recommended patient take nausea medication as prescribed. Recommended Ensure Enlive 2-3 bottles daily and provided samples plus coupons. Questions were answered.  Teach back method used.  Contact information provided.  Monitoring, evaluation, goals: Patient will tolerate increased calories and protein to minimize further weight loss.  Next visit: To be scheduled as needed.  **Disclaimer: This note was dictated with voice recognition software. Similar sounding words can inadvertently be transcribed and this note may contain transcription errors which may not have been corrected upon publication of note.**

## 2019-09-04 NOTE — Patient Instructions (Signed)
[facility] Discharge Instructions for Patients Receiving Chemotherapy  Today you received the following chemotherapy agents Oxaliplatin (ELOXATIN), Leucovorin & Flourouracil (ADRUCIL).  To help prevent nausea and vomiting after your treatment, we encourage you to take your nausea medication as prescribed.   If you develop nausea and vomiting that is not controlled by your nausea medication, call the clinic.   BELOW ARE SYMPTOMS THAT SHOULD BE REPORTED IMMEDIATELY:  *FEVER GREATER THAN 100.5 F  *CHILLS WITH OR WITHOUT FEVER  NAUSEA AND VOMITING THAT IS NOT CONTROLLED WITH YOUR NAUSEA MEDICATION  *UNUSUAL SHORTNESS OF BREATH  *UNUSUAL BRUISING OR BLEEDING  TENDERNESS IN MOUTH AND THROAT WITH OR WITHOUT PRESENCE OF ULCERS  *URINARY PROBLEMS  *BOWEL PROBLEMS  UNUSUAL RASH Items with * indicate a potential emergency and should be followed up as soon as possible.  Feel free to call the clinic should you have any questions or concerns. The clinic phone number is [phone].  Please show the [place] at check-in to the Emergency Department and triage nurse.   Oxaliplatin Injection Qu es este medicamento? El OXALIPLATINO es un agente quimioteraputico. Este medicamento acta sobre las clulas que se dividen rpidamente, como las clulas cancerosas, y finalmente provoca la muerte de estas clulas. Se utiliza en el tratamiento del cncer de colon y recto y 59 tipos de cncer. Este medicamento puede ser utilizado para otros usos; si tiene alguna pregunta consulte con su proveedor de atencin mdica o con su farmacutico. MARCAS COMUNES: Eloxatin Qu le debo informar a mi profesional de la salud antes de tomar este medicamento? Necesita saber si usted presenta alguno de los siguientes problemas o situaciones:  enfermedad renal  una reaccin alrgica o inusual al oxaliplatino, a otros agentes quimioteraputicos, a otros medicamentos, alimentos,  colorantes o conservantes  si est embarazada o buscando quedar embarazada  si est amamantando a un beb Cmo debo utilizar este medicamento? Este medicamento se administra mediante infusin por va intravenosa. Lo administra un profesional de la salud calificado en un hospital o en un entorno clnico. Hable con su pediatra para informarse acerca del uso de este medicamento en nios. Puede requerir atencin especial. Sobredosis: Pngase en contacto inmediatamente con un centro toxicolgico o una sala de urgencia si usted cree que haya tomado demasiado medicamento. ATENCIN: [employer] es solo para usted. No comparta este medicamento con nadie. Qu sucede si me olvido de una dosis? Es importante no olvidar ninguna dosis. Informe a su mdico o a su profesional de la salud si no puede asistir a Photographer. Qu puede interactuar con este medicamento?  medicamentos para incrementar los conteos sanguneos, tales como filgrastim, pegfilgrastim, sargramostim  probenecid  ciertos antibiticos, tales como amicacina, gentamicina, neomicina, polimixina B, estreptomicina, tobramicina  zalcitabina Consulte a su mdico o a su profesional de la salud antes de tomar cualquiera de los siguientes medicamentos:  acetaminofeno  aspirina  ibuprofeno  quetoprofeno  naproxeno Puede ser que esta lista no menciona todas las posibles interacciones. Informe a su profesional de KB Home	[place] de [employer] productos a base de hierbas, medicamentos de [place] o suplementos nutritivos que est tomando. Si usted fuma, consume bebidas alcohlicas o si utiliza drogas ilegales, indqueselo tambin a su profesional de KB Home	[place]. Algunas sustancias pueden interactuar con su medicamento. A qu debo estar atento al usar [employer]? Se supervisar su condicin atentamente mientras reciba este medicamento. Tendr que hacerse anlisis de sangre peridicos mientras reciba este medicamento. Este medicamento puede  aumentar su sensibilidad  al fro. No tomar bebidas fras o usar hielo. Cubra la piel expuesta antes de estar en contacto con temperaturas fras u objetos fros. Mientras se encuentra afuera cuando haga fro use ropa [place] su boca y su nariz para calentar el aire que entra en sus pulmones. Informe a su mdico si experimenta sensibilidad al fro. Este medicamento puede hacerle sentir un Nurse, mental health. Esto es normal ya que la quimioterapia afecta tanto a las clulas sanas como a las clulas cancerosas. Si presenta alguno de los [employer], infrmelos. Sin embargo, contine con el tratamiento aun si se siente enfermo, a menos que su mdico le indique que lo suspenda. En algunos casos, podr recibir [employer] para ayudarle con los efectos secundarios. Siga las instrucciones para usarlos. Consulte a su mdico o a su profesional de la salud por asesoramiento si tiene fiebre, escalofros, dolor de garganta o cualquier otro sntoma de resfro o gripe. No se trate usted mismo. Este medicamento puede reducir la capacidad del cuerpo para combatir infecciones. Trate de no acercarse a personas que estn enfermas. [employer] puede aumentar el riesgo de magulladuras o sangrado. Consulte a su mdico o a su profesional de la salud si observa sangrados inusuales. Proceda con cuidado al cepillar sus dientes, usar hilo dental o Risk manager palillos para los dientes, ya que puede contraer una infeccin o Therapist, art con mayor facilidad. Si se somete a algn tratamiento dental, informe a su dentista que est [employer]. Evite tomar productos que contienen aspirina, acetaminofeno, ibuprofeno, naproxeno o quetoprofeno a menos que as lo indique su mdico. Estos productos pueden disimular la fiebre. No se debe quedar embarazada mientras reciba este medicamento. Las mujeres deben informar a su mdico si estn buscando quedar embarazadas o si creen que estn embarazadas. Existe la  posibilidad de efectos secundarios graves a un beb sin nacer. Para ms informacin hable con su profesional de la salud o su farmacutico. No debe Economist a un beb mientras reciba este medicamento. Si tiene diarrea, llame a su mdico o a su profesional de KB Home	[place]. No se trate usted mismo. Qu efectos secundarios puedo tener al [employer] este medicamento? Efectos secundarios que debe informar a su mdico o a Barrister's clerk de la salud tan pronto como sea posible:  Chief of Staff como erupcin cutnea, picazn o urticarias, hinchazn de la cara, labios o lengua  conteos sanguneos bajos - este medicamento puede reducir la cantidad de glbulos blancos, glbulos rojos y plaquetas. Su riesgo de infeccin y [place].  signos de infeccin - fiebre o escalofros, tos, dolor de garganta, Social research officer, government o dificultad para orinar  signos de reduccin de plaquetas o sangrado - magulladuras, puntos rojos en la piel, heces de color oscuro o con aspecto alquitranado, sangrado por la nariz  signos de reduccin de glbulos rojos - cansancio o debilidad inusual, desmayos, sensacin de Lobbyist u opresin en el pecho  tos  diarrea  tensin en la mandbula  llagas en la boca  nuseas, vmito  dolor, hinchazn, enrojecimiento o irritacin en el lugar de la inyeccin  dolor, hormigueo, entumecimiento de manos o pies  problemas de coordinacin, del habla, al caminar  enrojecimiento, formacin de ampollas, descamacin o distensin de la piel, inclusive dentro de la boca  dificultad para orinar o cambios en el volumen de orina Efectos secundarios que, por lo general, no requieren atencin mdica (debe informarlos a su mdico o a su profesional de la salud si persisten o si  son molestos):  cambios en la visin  estreimiento  cada del cabello  prdida del apetito  sabor metlico en la boca o cambios en el sentido del gusto  dolor estomacal Puede  ser que esta lista no menciona todos los posibles efectos secundarios. Comunquese a su mdico por asesoramiento mdico [employer]. Usted puede informar los efectos secundarios a la FDA por telfono al 1-800-FDA-1088. Dnde debo guardar mi medicina? Este medicamento se administra en hospitales o clnicas y no necesitar guardarlo en su domicilio. ATENCIN: Este folleto es un resumen. Puede ser que no cubra toda la posible informacin. Si usted tiene preguntas acerca de esta medicina, consulte con su mdico, su farmacutico o su profesional de Technical sales engineer.  2020 Elsevier/Gold Standard (2014-06-23 00:00:00)   Leucovorin injection Qu es este medicamento? La LEUCOVORINA se [place] para prevenir o tratar [employer] nocivos de ciertos medicamentos. Este medicamento tambin sirve para tratar la anemia provocada por una nivel bajo de cido flico en el cuerpo. Tambin se puede administrar con 5-fluorouracilo (5-FU), para tratar el cncer de colon. Este medicamento puede ser utilizado para otros usos; si tiene alguna pregunta consulte con su proveedor de atencin mdica o con su farmacutico. Qu le debo informar a mi profesional de la salud antes de tomar este medicamento? Necesita saber si usted presenta alguno de los siguientes problemas o situaciones:  anemia debido a bajos niveles de vitamina B-12 en la sangre  una reaccin alrgica o inusual a la leucovorina, cido flico, a otros medicamentos, alimentos, colorantes o conservantes  si est embarazada o buscando quedar embarazada  si est amamantando a un beb Cmo debo utilizar este medicamento? Este medicamento se administra mediante inyeccin por va intramuscular o intravenosa. Lo administra un profesional de Technical sales engineer en un hospital o en un entorno clnico. Hable con su pediatra para informarse acerca del uso de este medicamento en nios. Puede requerir atencin especial. Sobredosis: Pngase en contacto inmediatamente  con un centro toxicolgico o una sala de urgencia si usted cree que haya tomado demasiado medicamento. ATENCIN: [employer] es solo para usted. No comparta este medicamento con nadie. Qu sucede si me olvido de una dosis? No se aplica en este caso. Qu puede interactuar con este medicamento?  capecitabina  fluorouracilo  fenobarbital  fenitona  primidona  trimetoprima- sulfametoxasol Puede ser que esta lista no menciona todas las posibles interacciones. Informe a su profesional de KB Home	[place] de [employer] productos a base de hierbas, medicamentos de [place] o suplementos nutritivos que est tomando. Si usted fuma, consume bebidas alcohlicas o si utiliza drogas ilegales, indqueselo tambin a su profesional de KB Home	[place]. Algunas sustancias pueden interactuar con su medicamento. A qu debo estar atento al usar [employer]? Se supervisar su estado de salud atentamente mientras reciba este medicamento. Este medicamento puede aumentar los efectos secundarios de 5-fluorouracilo, 5-FU. Si tiene diarrea o [employer] boca que no mejoran o que [place], consulte a su mdico o a su profesional de KB Home	[place]. Qu efectos secundarios puedo tener al [employer] este medicamento? Efectos secundarios que debe informar a su mdico o a Barrister's clerk de la salud tan pronto como sea posible:  Chief of Staff como erupcin cutnea, picazn o urticarias, hinchazn de la cara, labios o lengua  problemas respiratorios  fiebre, infeccin  llagas en la boca  sangrado, magulladuras inusuales  cansancio o debilidad inusual Efectos secundarios que, por lo general, no requieren atencin mdica (debe informarlos a su mdico o a Barrister's clerk de  la salud si persisten o si son molestos):  estreimiento o diarrea  prdida del apetito  nuseas, vmito Puede ser que esta lista no menciona todos los posibles efectos secundarios. Comunquese a su mdico por asesoramiento mdico [employer]. Usted puede informar los efectos secundarios a la FDA por telfono al 1-800-FDA-1088. Dnde debo guardar mi medicina? Este medicamento se administra en hospitales o clnicas y no necesitar guardarlo en su domicilio. ATENCIN: Este folleto es un resumen. Puede ser que no cubra toda la posible informacin. Si usted tiene preguntas acerca de esta medicina, consulte con su mdico, su farmacutico o su profesional de Technical sales engineer.  2020 Elsevier/Gold Standard (2014-06-23 00:00:00)  Fluorouracil, 5-FU injection Qu es este medicamento? El [place], 5-FU es un agente quimioteraputico. Este medicamento reduce el crecimiento de las clulas cancerosas. Se utiliza en el tratamiento de muchos tipos de cncer, incluyendo el cncer de mama, de colon y recto, pancretico y de Paramedic. Este medicamento puede ser utilizado para otros usos; si tiene alguna pregunta consulte con su proveedor de atencin mdica o con su farmacutico. MARCAS COMUNES: Adrucil Qu le debo informar a mi profesional de la salud antes de tomar este medicamento? Necesita saber si usted presenta alguno de los siguientes problemas o situaciones:  trastornos sanguneos  deficiencia de la dihidropirimidina deshidrogenasa (DPD)  infeccin (especialmente infecciones virales, como varicela o herpes)  enfermedad renal  enfermedad heptica  desnutrido, malnutricin  radioterapia reciente o continuada  una reaccin alrgica o inusual al fluorouracilo, a otros agentes quimioteraputicos, otros medicamentos, alimentos, colorantes o conservantes  si est embarazada o buscando quedar embarazada  si est amamantando a un beb Cmo debo utilizar este medicamento? Este medicamento se administra mediante inyeccin o infusin por va intravenosa. Lo administra un profesional de la salud calificado en un hospital o en un entorno clnico. Hable con su pediatra para informarse acerca del uso de este medicamento en  nios. Puede requerir atencin especial. Sobredosis: Pngase en contacto inmediatamente con un centro toxicolgico o una sala de urgencia si usted cree que haya tomado demasiado medicamento. ATENCIN: [employer] es solo para usted. No comparta este medicamento con nadie. Qu sucede si me olvido de una dosis? Es importante no olvidar ninguna dosis. Informe a su mdico o a su profesional de la salud si no puede asistir a Photographer. Qu puede interactuar con este medicamento?  alopurinol  cimetidina  dapsona  digoxina  hidroxiurea  leucovorina  levamisol  medicamentos para convulsiones, tales como etotona, fosfenitona, fenitona  medicamentos para incrementar los conteos sanguneos, tales como filgrastim, pegfilgrastim, sargramostim  medicamentos que tratan o previenen cogulos sanguneos, tales como warfarina, enoxaparina y dalteparina  metotrexato  metronidazol  pirimetamina  otros agentes quimioteraputicos, tales como busulfn, cisplatino, estramustina, vinblastina  trimetoprima  trimetrexato  vacunas Consulte a su mdico o a su profesional de la salud antes de tomar cualquiera de los siguientes medicamentos:  acetaminofeno  aspirina  ibuprofeno  quetoprofeno  naproxeno Puede ser que esta lista no menciona todas las posibles interacciones. Informe a su profesional de KB Home	[place] de [employer] productos a base de hierbas, medicamentos de [place] o suplementos nutritivos que est tomando. Si usted fuma, consume bebidas alcohlicas o si utiliza drogas ilegales, indqueselo tambin a su profesional de KB Home	[place]. Algunas sustancias pueden interactuar con su medicamento. A qu debo estar atento al usar Coca[employer]Cola? Visite a su mdico para chequear su evolucin peridicamente. Este medicamento puede hacerle sentir un Nurse, mental health. Esto es normal ya que  la quimioterapia afecta tanto a las clulas sanas como a las clulas cancerosas. Si presenta alguno  de los [employer], infrmelos. Sin embargo, contine con el tratamiento aun si se siente enfermo, a menos que su mdico le indique que lo suspenda. En algunos casos, podr recibir [employer] para ayudarle con los efectos secundarios. Siga las instrucciones para usarlos. Consulte a su mdico o a su profesional de la salud por asesoramiento si tiene fiebre, escalofros, dolor de garganta o cualquier otro sntoma de resfro o gripe. No se trate usted mismo. Este medicamento puede reducir la capacidad del cuerpo para combatir infecciones. Trate de no acercarse a personas que estn enfermas. [employer] puede aumentar el riesgo de magulladuras o sangrado. Consulte a su mdico o a su profesional de la salud si observa sangrados inusuales. Proceda con cuidado al cepillar sus dientes, usar hilo dental o Risk manager palillos para los dientes, ya que puede contraer una infeccin o Therapist, art con mayor facilidad. Si se somete a algn tratamiento dental, informe a su dentista que est [employer]. Evite tomar productos que contienen aspirina, acetaminofeno, ibuprofeno, naproxeno o quetoprofeno a menos que as lo indique su mdico. Estos productos pueden disimular la fiebre. No se debe quedar embarazada mientras recibe este medicamento. Las mujeres deben informar a su mdico si estn buscando quedar embarazadas o si creen que estn embarazadas. Existe la posibilidad de efectos secundarios graves a un beb sin nacer. Para ms informacin hable con su profesional de la salud o su farmacutico. No debe Economist a un beb mientras est usando este medicamento. Los hombres deben informar a su mdico si quieren tener nios. Este medicamento puede reducir el conteo de esperma. No trate la diarrea con productos de [employer]. Comunquese con su mdico si tiene diarrea que dura ms de 2 das o si es severa y [place]. Este medicamento puede aumentar la sensibilidad al sol. Mantngase fuera de Arboriculturist. Si no lo puede evitar, utilice ropa protectora y crema de Photographer. No utilice lmparas solares, camas solares ni cabinas solares. Qu efectos secundarios puedo tener al [employer] este medicamento? Efectos secundarios que debe informar a su mdico o a Barrister's clerk de la salud tan pronto como sea posible:  Chief of Staff como erupcin cutnea, picazn o urticarias, hinchazn de la cara, labios o lengua  conteos sanguneos bajos - este medicamento puede reducir la cantidad de glbulos blancos, glbulos rojos y plaquetas. Su riesgo de infeccin y [place].  signos de infeccin - fiebre o escalofros, tos, dolor de garganta, dolor o dificultad para orinar  signos de reduccin de plaquetas o sangrado - magulladuras, puntos rojos en la piel, heces de color oscuro o con aspecto alquitranado, sangre en la orina  signos de reduccin de glbulos rojos - cansancio o debilidad inusual, desmayos, sensacin de mareo  problemas respiratorios  cambios en la visin  dolor en el pecho  llagas en la boca  nuseas, vmito  dolor, hinchazn, enrojecimiento en el lugar de la inyeccin  hormigueo, dolor, entumecimiento de manos o pies  enrojecimiento, hinchazn o llagas en las manos o pies  dolor estomacal  sangrado inusuales Efectos secundarios que, por lo general, no requieren atencin mdica (debe informarlos a su mdico o a su profesional de la salud si persisten o si son molestos):  cambios en las uas de las manos o pies  diarrea  picazn o sequedad de la piel  cada del cabello  dolor de cabeza  prdida del apetito  sensibilidad de los ojos a la luz  Higher education careers adviser  ojos inusualmente llorosos Puede ser que esta lista no menciona todos los posibles efectos secundarios. Comunquese a su mdico por asesoramiento mdico [employer]. Usted puede informar los efectos secundarios a la FDA por telfono al  1-800-FDA-1088. Dnde debo guardar mi medicina? Este medicamento se administra en hospitales o clnicas y no necesitar guardarlo en su domicilio. ATENCIN: Este folleto es un resumen. Puede ser que no cubra toda la posible informacin. Si usted tiene preguntas acerca de esta medicina, consulte con su mdico, su farmacutico o su profesional de Technical sales engineer.  2020 Elsevier/Gold Standard (2014-06-23 00:00:00)

## 2019-09-05 ENCOUNTER — Telehealth: Payer: Self-pay

## 2019-09-05 NOTE — Telephone Encounter (Signed)
TCT patient to f/u on first chemo treatment on 09/04/19; spoke with grandson [place] who verbalized patient is "doing great and he woke up very happy this morning" advised Darrick Meigs to remind pt. to be in clinic at 1145 on Saturday 09/06/19 for pump d/c stop and to walk into treatment area. Christian verbalized understand. Knows to call clinic with any complain or concerns.

## 2019-09-06 ENCOUNTER — Inpatient Hospital Stay: Payer: Self-pay

## 2019-09-06 ENCOUNTER — Other Ambulatory Visit: Payer: Self-pay

## 2019-09-06 VITALS — BP 98/58 | HR 111 | Temp 99.2°F | Resp 20

## 2019-09-06 DIAGNOSIS — C160 Malignant neoplasm of cardia: Secondary | ICD-10-CM

## 2019-09-06 MED ORDER — SODIUM CHLORIDE 0.9% FLUSH
10.0000 mL | INTRAVENOUS | Status: DC | PRN
  Administered 2019-09-06: 10 mL
  Filled 2019-09-06: qty 10

## 2019-09-06 MED ORDER — HEPARIN SOD (PORK) LOCK FLUSH 100 UNIT/ML IV SOLN
500.0000 [IU] | Freq: Once | INTRAVENOUS | Status: AC | PRN
  Administered 2019-09-06: 500 [IU]
  Filled 2019-09-06: qty 5

## 2019-09-10 ENCOUNTER — Telehealth: Payer: Self-pay | Admitting: *Deleted

## 2019-09-10 ENCOUNTER — Encounter: Payer: Self-pay | Admitting: Pharmacy Technician

## 2019-09-10 MED ORDER — MAGIC MOUTHWASH: 5.0000 mL | Freq: Four times a day (QID) | ORAL | 0 refills | Status: DC | PRN

## 2019-09-10 MED ORDER — MAGIC MOUTHWASH: 5.0000 mL | Freq: Four times a day (QID) | ORAL | 0 refills | Status: AC | PRN

## 2019-09-10 MED FILL — CMPD-MMW-NYST/GERI/DIPHEN: 12 days supply | Qty: 240 | Fill #0

## 2019-09-10 NOTE — Progress Notes (Signed)
Patient has been approved for drug assistance by [employer] for Opdivo. The enrollment period is from 09/10/19-09/09/20 based on self pay. First DOS covered is 09/18/19.

## 2019-09-10 NOTE — Telephone Encounter (Signed)
Spoke with daughter, Maryjean Morn and informed them he has been approved for medication assistance. Patient reporting sores in mouth on tongue. Daughter [phone]) requesting prescription be called in for him to "the low income pharmacy beside the hospital". Called daughter to clarify pharmacy, but she speaks no [place]. The [employer] for assistance and it went straight to voice mail. Will send MMW to [place].

## 2019-09-12 ENCOUNTER — Other Ambulatory Visit: Payer: Self-pay

## 2019-09-12 ENCOUNTER — Inpatient Hospital Stay: Payer: Self-pay

## 2019-09-12 ENCOUNTER — Inpatient Hospital Stay (HOSPITAL_BASED_OUTPATIENT_CLINIC_OR_DEPARTMENT_OTHER): Payer: Self-pay | Admitting: Nurse Practitioner

## 2019-09-12 ENCOUNTER — Encounter: Payer: Self-pay | Admitting: Nurse Practitioner

## 2019-09-12 VITALS — BP 108/72 | HR 108 | Temp 98.7°F | Resp 20 | Ht 68.5 in | Wt 114.6 lb

## 2019-09-12 VITALS — BP 101/66 | HR 84 | Resp 18

## 2019-09-12 DIAGNOSIS — Z95828 Presence of other vascular implants and grafts: Secondary | ICD-10-CM

## 2019-09-12 DIAGNOSIS — C160 Malignant neoplasm of cardia: Secondary | ICD-10-CM

## 2019-09-12 LAB — CBC WITH DIFFERENTIAL (CANCER CENTER ONLY)
Abs Immature Granulocytes: 0.04 10*3/uL (ref 0.00–0.07)
Basophils Absolute: 0 10*3/uL (ref 0.0–0.1)
Basophils Relative: 0 %
Eosinophils Absolute: 0.3 10*3/uL (ref 0.0–0.5)
Eosinophils Relative: 3 %
HCT: 34 % — ABNORMAL LOW (ref 39.0–52.0)
Hemoglobin: 11.5 g/dL — ABNORMAL LOW (ref 13.0–17.0)
Immature Granulocytes: 1 %
Lymphocytes Relative: 19 %
Lymphs Abs: 1.4 10*3/uL (ref 0.7–4.0)
MCH: 27.4 pg (ref 26.0–34.0)
MCHC: 33.8 g/dL (ref 30.0–36.0)
MCV: 81.1 fL (ref 80.0–100.0)
Monocytes Absolute: 0.3 10*3/uL (ref 0.1–1.0)
Monocytes Relative: 4 %
Neutro Abs: 5.4 10*3/uL (ref 1.7–7.7)
Neutrophils Relative %: 73 %
Platelet Count: 346 10*3/uL (ref 150–400)
RBC: 4.19 MIL/uL — ABNORMAL LOW (ref 4.22–5.81)
RDW: 13.5 % (ref 11.5–15.5)
WBC Count: 7.3 10*3/uL (ref 4.0–10.5)
nRBC: 0 % (ref 0.0–0.2)

## 2019-09-12 LAB — CMP (CANCER CENTER ONLY)
ALT: 11 U/L (ref 0–44)
AST: 36 U/L (ref 15–41)
Albumin: 2.8 g/dL — ABNORMAL LOW (ref 3.5–5.0)
Alkaline Phosphatase: 142 U/L — ABNORMAL HIGH (ref 38–126)
Anion gap: 12 (ref 5–15)
BUN: 11 mg/dL (ref 8–23)
CO2: 23 mmol/L (ref 22–32)
Calcium: 8.8 mg/dL — ABNORMAL LOW (ref 8.9–10.3)
Chloride: 96 mmol/L — ABNORMAL LOW (ref 98–111)
Creatinine: 0.66 mg/dL (ref 0.61–1.24)
GFR, Est AFR Am: >60 mL/min (ref >60)
GFR, Estimated: >60 mL/min (ref >60)
Glucose, Bld: 143 mg/dL — ABNORMAL HIGH (ref 70–99)
Potassium: 4.4 mmol/L (ref 3.5–5.1)
Sodium: 131 mmol/L — ABNORMAL LOW (ref 135–145)
Total Bilirubin: 0.7 mg/dL (ref 0.3–1.2)
Total Protein: 6.9 g/dL (ref 6.5–8.1)

## 2019-09-12 MED ORDER — SODIUM CHLORIDE 0.9% FLUSH
10.0000 mL | INTRAVENOUS | Status: DC | PRN
  Administered 2019-09-12: 15:00:00 10 mL via INTRAVENOUS
  Filled 2019-09-12: qty 10

## 2019-09-12 MED ORDER — SODIUM CHLORIDE 0.9% FLUSH
10.0000 mL | Freq: Once | INTRAVENOUS | Status: DC
  Filled 2019-09-12: qty 10

## 2019-09-12 MED ORDER — SODIUM CHLORIDE 0.9 % IV SOLN
INTRAVENOUS | Status: DC
  Filled 2019-09-12 (×2): qty 250

## 2019-09-12 MED ORDER — HEPARIN SOD (PORK) LOCK FLUSH 100 UNIT/ML IV SOLN
500.0000 [IU] | Freq: Once | INTRAVENOUS | Status: AC
  Administered 2019-09-12: 500 [IU]
  Filled 2019-09-12: qty 5

## 2019-09-12 MED ORDER — SODIUM CHLORIDE 0.9% FLUSH
10.0000 mL | Freq: Once | INTRAVENOUS | Status: AC
  Administered 2019-09-12: 18:00:00 10 mL
  Filled 2019-09-12: qty 10

## 2019-09-12 MED ORDER — HEPARIN SOD (PORK) LOCK FLUSH 100 UNIT/ML IV SOLN
500.0000 [IU] | Freq: Once | INTRAVENOUS | Status: AC
  Administered 2019-09-12: 500 [IU] via INTRAVENOUS
  Filled 2019-09-12: qty 5

## 2019-09-12 NOTE — Progress Notes (Signed)
Pharmacist Chemotherapy Monitoring - Follow Up Assessment    I verify that I have reviewed each item in the below checklist:  . Regimen for the patient is scheduled for the appropriate day and plan matches scheduled date. Marland Kitchen Appropriate non-routine labs are ordered dependent on drug ordered. . If applicable, additional medications reviewed and ordered per protocol based on lifetime cumulative doses and/or treatment regimen.   Plan for follow-up and/or issues identified: No . I-vent associated with next due treatment: No . MD and/or nursing notified: No  Alexander Lawson 09/12/2019 9:58 AM

## 2019-09-12 NOTE — Patient Instructions (Signed)
Deshidratacin en los adultos Dehydration, Adult La deshidratacin es una afeccin que se caracteriza por una cantidad insuficiente de agua u otros lquidos en el organismo. Esto sucede cuando una persona pierde ms lquidos de los que consume. Las partes importantes del cuerpo no pueden funcionar correctamente sin una cantidad [place] de lquidos. Cualquier prdida de lquidos del organismo puede causar deshidratacin. La deshidratacin puede ser leve, grave o muy grave. Debe tratarse de inmediato para evitar que sea muy grave. Cules son las causas? Esta afeccin puede ser causada por lo siguiente:  Afecciones que hacen que pierda agua u otros lquidos, por ejemplo: ? Materia fecal lquida (diarrea). ? Vmitos. ? Sudoracin abundante. ? Hacer mucho pis (orinar).  No beber suficientes lquidos, especialmente cuando: ? Usted est enfermo. ? Hace cosas que requieren Automatic Data.  Otras enfermedades y afecciones, como fiebre o infeccin.  Determinados medicamentos, como aquellos que eliminan el exceso de lquido del organismo (diurticos).  Falta de agua potable segura.  No poder obtener suficiente agua y alimentos. Qu incrementa el riesgo? Los siguientes factores pueden hacer que sea ms propenso a Armed forces training and education officer afeccin:  Tener una enfermedad prolongada (crnica) que no se ha tratado de Estate agent, como: ? Diabetes. ? Enfermedad cardaca. ? Enfermedad renal.  Ser mayor de 65aos de edad.  Tener una discapacidad.  Vivir en un lugar alto con respecto al suelo o al nivel del mar (de gran altitud). El aire menos denso y ms seco causa ms prdida de lquidos.  Hacer ejercicios que sobrecargan el cuerpo [employer]. Cules son los signos o sntomas? Los sntomas de deshidratacin dependen de su gravedad. Deshidratacin leve o grave  Sed.  Sequedad en los labios o la boca.  Sensacin de [employer] o desvanecimiento, especialmente al ponerse de pie  despus de estar sentado.  Calambres musculares.  Su cuerpo produce: ? Pis (orina) de color oscuro. Orina que puede tener el color del t. ? Bennie Hind menos que lo normal. ? Tiene menos lgrimas que lo normal.  Dolor de [place]. Deshidratacin muy grave  Cambios en la piel. La piel puede: ? Estar fra al tacto (pegajosa). ? Tener manchas o estar plida. ? No volver a la normalidad de inmediato despus de pellizcarla ligeramente y soltarla.  Escasa produccin o ausencia de lgrimas, orina o sudor.  Cambios en los signos vitales, por ejemplo: ? Respiracin acelerada. ? Presin arterial baja. ? Pulso dbil. ? Pulso que supera los 100latidos por minuto cuando est sentado y [place].  Otros cambios, por ejemplo: ? Catering manager sed. ? Ojos que se ven huecos (hundidos). ? Manos y pies fros. ? Estar desorientado (confundido). ? Estar muy cansado (aletargado) o tener problemas para despertarse. ? Prdida de peso a Control and instrumentation engineer. ? Prdida de la conciencia. Cmo se trata? El tratamiento de esta afeccin depende de su gravedad. El tratamiento debe comenzar de inmediato. No espere hasta que su afeccin sea muy grave. La deshidratacin muy grave es Engineer, maintenance (IT). Tendr que ir al hospital.  La deshidratacin leve o grave puede tratarse en la casa. Le pedirn que: ? Beba ms lquidos. ? Beba una solucin de rehidratacin oral (SRO). Esta bebida ayuda a reponer las cantidades correctas de lquidos y de sales y Optometrist en la sangre (electrolitos).  La deshidratacin muy grave puede tratarse: ? Con lquidos a travs de un tubo (catter) intravenoso. ? Restableciendo los [employer] de sales y Public librarian. A menudo, para esto, se administran sales y [employer] a travs de un  tubo. El tubo se introduce a travs de la nariz hasta el estmago. ? Tratando el origen del problema. Siga estas instrucciones en su casa: Solucin de rehidratacin oral Si se lo indica el mdico, beba  una SRO:  Prepare una SRO. Use las instrucciones del envase.  Comience por beber pequeas cantidades, aproximadamente taza (120ml) cada 5 a 10minutos.  Beba de a poco hasta llegar a la cantidad indicada por el mdico. Comida y bebida         Beba suficiente lquido transparente como para mantener la orina de color amarillo plido. Si le indicaron que tome una SRO, termine primero la solucin. Luego, comience a beber lentamente otros lquidos transparentes. Beba lquidos, por ejemplo: ? Agua. No beba solamente agua. Esto puede hacer que los niveles de sal (sodio) en el organismo bajen demasiado. ? Agua de trocitos de hielo que usted succiona. ? Jugo de frutas con agregado de agua (diluido). ? Bebidas deportivas de bajas caloras.  Consuma alimentos que contengan la cantidad adecuada de sales y minerales, por ejemplo: ? Bananas. ? [place]aranjas. ? Papas. ? [place]. ? [place].  No beba alcohol.  Evite lo siguiente: ? Bebidas que contengan gran cantidad de azcar. Esto incluye lo siguiente:  Bebidas deportivas ricas en caloras.  Jugo de frutas sin agregado de agua.  Gaseosas.  Cafena. ? Alimentos grasos o que contienen mucha grasa o azcar. Instrucciones generales  Use los medicamentos de venta libre y los recetados solamente como se lo haya indicado el mdico.  No tome comprimidos de sal. Esto puede hacer que los niveles de sal en el cuerpo suban demasiado.  Retome sus actividades normales segn lo indicado por el mdico. Pregntele al mdico qu actividades son seguras para usted.  Concurra a todas las visitas de seguimiento como se lo haya indicado el mdico. Esto es importante. Comunquese con un mdico si:  Tiene dolor de vientre (abdomen) y el dolor: ? Empeora. ? Permanece en un solo lugar.  Tiene una erupcin cutnea.  Presenta rigidez en el cuello.  Se siente enojado o molesto (irritable) con ms facilidad de lo normal.  Est ms cansado o le cuesta  despertarse ms de lo normal.  Presenta los siguientes sntomas: ? Debilidad o mareos. ? Mucha sed. Solicite ayuda inmediatamente si tiene:  Algn sntoma de deshidratacin muy grave.  Sntomas de vmitos, por ejemplo: ? No puede comer ni beber sin vomitar. ? Los vmitos empeoran o no desaparecen. ? Su vmito tiene sangre o una sustancia verde.  Sntomas que empeoran con el tratamiento.  Fiebre.  Dolor de cabeza muy intenso.  Problemas para orinar o para defecar (tener deposiciones), como: ? Heces acuosas que empeoran o que no desaparecen. ? Sangre en las heces (deposiciones). Esto puede hacer que la materia fecal sea negra y tenga aspecto alquitranado. ? No orinar durante 6 a 8 horas. ? Hacer una cantidad pequea de orina muy oscura en el trmino de 6 a 8horas.  Dificultad para respirar. Estos sntomas pueden indicar una emergencia. No espere a ver si los sntomas desaparecen. Solicite atencin mdica de inmediato. Comunquese con el servicio de emergencias de su localidad (911 en los [place]). No conduzca por sus propios medios hasta el hospital. Resumen  La deshidratacin es una afeccin que se caracteriza por una cantidad insuficiente de agua u otros lquidos en el organismo. Esto sucede cuando una persona pierde ms lquidos de los que consume.  El tratamiento de esta afeccin depende de su gravedad. El tratamiento se debe comenzar   de inmediato. No espere hasta que su afeccin sea muy grave.  Beba suficiente lquido transparente como para mantener la orina de color amarillo plido. Si le indicaron que tome una solucin de rehidratacin oral (SRO), termine primero la solucin. Luego, comience a beber lentamente otros lquidos transparentes.  Use los medicamentos de venta libre y los recetados solamente como se lo haya indicado el mdico.  Obtenga ayuda de inmediato si tiene algn sntoma de deshidratacin muy grave. Esta informacin no tiene como fin reemplazar el  consejo del mdico. Asegrese de hacerle al mdico cualquier pregunta que tenga. Document Revised: 01/16/2019 Document Reviewed: 01/16/2019 Elsevier Patient Education  2020 Elsevier Inc.  

## 2019-09-12 NOTE — Progress Notes (Addendum)
  [facility] OFFICE PROGRESS NOTE   Diagnosis: Gastric cancer  INTERVAL HISTORY:   Mr. Bills returns as scheduled.  He completed cycle 1 FOLFOX 09/04/2019.  He had an episode of nausea/vomiting last weekend.  He has mouth sores/throat pain.  He is having difficulty swallowing.  Daughter reports he is doing okay with liquids.  He has not picked up Magic mouthwash from the pharmacy.  Burning discomfort with bowel movements.  No diarrhea.  No hand or foot pain or redness.  He feels weak.  Objective:  Vital signs in last 24 hours:  Blood pressure 108/72, pulse (!) 108, temperature 98.7 F (37.1 C), temperature source Oral, resp. rate 20, height 5' 8.5" (1.74 m), weight 114 lb 9.6 oz (52 kg), SpO2 100 %.    HEENT: Ulcers scattered over the lower inner lip.  A few small ulcers lateral edges of the tongue.  Question early thrush. GI: No hepatomegaly.  Palpable mass left abdomen, associated tenderness. Vascular: No leg edema.  Skin: Decreased skin turgor. Port-A-Cath without erythema.   Lab Results:  Lab Results  Component Value Date   WBC 7.3 09/12/2019   HGB 11.5 (L) 09/12/2019   HCT 34.0 (L) 09/12/2019   MCV 81.1 09/12/2019   PLT 346 09/12/2019   NEUTROABS 5.4 09/12/2019    Imaging:  No results found.  Medications: I have reviewed the patient's current medications.  Assessment/Plan: 1. Metastatic gastric cancer  EGD 08/15/2019-large ulcerated lesion in the gastric cardia immediately below the GE junction, malignant appearing.  Biopsy-adenocarcinoma, poorly differentiated; HER-2 negative, PD1 combined positive score 3  CT abdomen/pelvis August 25, 2019-large centrally necrotic mass left upper quadrant of the abdomen measuring 12.2 x 9.0 x 8.6 cm, thick irregular enhancing walls, central low density and multiple central air bubbles; the mass is located between the liver, stomach and pancreas and appears inseparable from the lesser curvature of the stomach.   Multiple liver lesions throughout all segments.  Metastasis within the caudate lobe of the liver exerts mild mass-effect on the IVC which remains patent.  Multiple small lymph nodes in the gastrohepatic ligament and retroperitoneum, not pathologically enlarged.  Cycle 1 FOLFOX 09/04/2019 2. Abdominal pain secondary to #1  3. Dysphagia/odynophagia secondary to #1  4. Anorexia/weight loss secondary to #1  5. Diabetes 6. Mucositis secondary to chemotherapy  Disposition: Mr. Kulish completed cycle 1 FOLFOX 09/04/2019.  He has mucositis related to the chemotherapy.  He appears dehydrated.  He will receive IV fluids in the office today and tomorrow.  He will begin Magic mouthwash for the mouth/throat discomfort.  He has hydrocodone liquid if needed.  We will see him in follow-up on 09/15/2019.  He will contact the office in the interim with any problems.  Patient seen with Dr. Benay Spice.   Ned Card ANP/GNP-BC   09/12/2019  3:22 PM This was a shared visit with Ned Card.  Mr. Bubolz was interviewed and examined.  He is now at day 9 following cycle 1 FOLFOX.  He has developed mucositis secondary to chemotherapy.  He appears dehydrated.  He will receive intravenous hydration.  He was prescribed Magic mouthwash and will use hydrocodone as needed for pain.  He will seek medical attention if he is unable to tolerate liquids.  Mr. Kenner will be seen for a follow-up visit on 09/08/2019.  Nivolumab will be added with cycle 2 systemic therapy.  5-fluorouracil will be dose reduced.  Julieanne Manson, MD

## 2019-09-15 ENCOUNTER — Encounter: Payer: Self-pay | Admitting: Nurse Practitioner

## 2019-09-15 ENCOUNTER — Other Ambulatory Visit: Payer: Self-pay

## 2019-09-15 ENCOUNTER — Inpatient Hospital Stay: Payer: Self-pay | Attending: Nurse Practitioner | Admitting: Nurse Practitioner

## 2019-09-15 ENCOUNTER — Inpatient Hospital Stay: Payer: Self-pay

## 2019-09-15 VITALS — BP 100/69 | HR 99 | Temp 98.5°F | Resp 18

## 2019-09-15 DIAGNOSIS — R630 Anorexia: Secondary | ICD-10-CM | POA: Insufficient documentation

## 2019-09-15 DIAGNOSIS — C160 Malignant neoplasm of cardia: Secondary | ICD-10-CM | POA: Insufficient documentation

## 2019-09-15 DIAGNOSIS — R5081 Fever presenting with conditions classified elsewhere: Secondary | ICD-10-CM | POA: Insufficient documentation

## 2019-09-15 DIAGNOSIS — R531 Weakness: Secondary | ICD-10-CM | POA: Insufficient documentation

## 2019-09-15 DIAGNOSIS — R519 Headache, unspecified: Secondary | ICD-10-CM | POA: Insufficient documentation

## 2019-09-15 DIAGNOSIS — D709 Neutropenia, unspecified: Secondary | ICD-10-CM | POA: Insufficient documentation

## 2019-09-15 DIAGNOSIS — T451X5A Adverse effect of antineoplastic and immunosuppressive drugs, initial encounter: Secondary | ICD-10-CM | POA: Insufficient documentation

## 2019-09-15 DIAGNOSIS — R634 Abnormal weight loss: Secondary | ICD-10-CM | POA: Insufficient documentation

## 2019-09-15 DIAGNOSIS — K1231 Oral mucositis (ulcerative) due to antineoplastic therapy: Secondary | ICD-10-CM | POA: Insufficient documentation

## 2019-09-15 DIAGNOSIS — E860 Dehydration: Secondary | ICD-10-CM | POA: Insufficient documentation

## 2019-09-15 DIAGNOSIS — Z5111 Encounter for antineoplastic chemotherapy: Secondary | ICD-10-CM | POA: Insufficient documentation

## 2019-09-15 DIAGNOSIS — Z7689 Persons encountering health services in other specified circumstances: Secondary | ICD-10-CM | POA: Insufficient documentation

## 2019-09-15 DIAGNOSIS — R05 Cough: Secondary | ICD-10-CM | POA: Insufficient documentation

## 2019-09-15 DIAGNOSIS — G893 Neoplasm related pain (acute) (chronic): Secondary | ICD-10-CM | POA: Insufficient documentation

## 2019-09-15 DIAGNOSIS — C787 Secondary malignant neoplasm of liver and intrahepatic bile duct: Secondary | ICD-10-CM | POA: Insufficient documentation

## 2019-09-15 DIAGNOSIS — E119 Type 2 diabetes mellitus without complications: Secondary | ICD-10-CM | POA: Insufficient documentation

## 2019-09-15 MED ORDER — SODIUM CHLORIDE 0.9 % IV SOLN
INTRAVENOUS | Status: DC
  Filled 2019-09-15 (×2): qty 250

## 2019-09-15 MED ORDER — HEPARIN SOD (PORK) LOCK FLUSH 100 UNIT/ML IV SOLN
500.0000 [IU] | Freq: Once | INTRAVENOUS | Status: AC
  Administered 2019-09-15: 500 [IU]
  Filled 2019-09-15: qty 5

## 2019-09-15 MED ORDER — SODIUM CHLORIDE 0.9% FLUSH
10.0000 mL | Freq: Once | INTRAVENOUS | Status: AC
  Administered 2019-09-15: 10 mL
  Filled 2019-09-15: qty 10

## 2019-09-15 NOTE — Patient Instructions (Signed)
Deshidratacin en los adultos Dehydration, Adult La deshidratacin es una afeccin que se caracteriza por una cantidad insuficiente de agua u otros lquidos en el organismo. Esto sucede cuando una persona pierde ms lquidos de los que consume. Las partes importantes del cuerpo no pueden funcionar correctamente sin una cantidad [place] de lquidos. Cualquier prdida de lquidos del organismo puede causar deshidratacin. La deshidratacin puede ser leve, grave o muy grave. Debe tratarse de inmediato para evitar que sea muy grave. Cules son las causas? Esta afeccin puede ser causada por lo siguiente:  Afecciones que hacen que pierda agua u otros lquidos, por ejemplo: ? Materia fecal lquida (diarrea). ? Vmitos. ? Sudoracin abundante. ? Hacer mucho pis (orinar).  No beber suficientes lquidos, especialmente cuando: ? Usted est enfermo. ? Hace cosas que requieren [employer].  Otras enfermedades y afecciones, como fiebre o infeccin.  Determinados medicamentos, como aquellos que eliminan el exceso de lquido del organismo (diurticos).  Falta de agua potable segura.  No poder obtener suficiente agua y alimentos. Qu incrementa el riesgo? Los siguientes factores pueden hacer que sea ms propenso a Armed forces training and education officer afeccin:  Tener una enfermedad prolongada (crnica) que no se ha tratado de Estate agent, como: ? Diabetes. ? Enfermedad cardaca. ? Enfermedad renal.  Ser mayor de 65aos de edad.  Tener una discapacidad.  Vivir en un lugar alto con respecto al suelo o al nivel del mar (de gran altitud). El aire menos denso y ms seco causa ms prdida de lquidos.  Hacer ejercicios que sobrecargan el cuerpo [employer]. Cules son los signos o sntomas? Los sntomas de deshidratacin dependen de su gravedad. Deshidratacin leve o grave  Sed.  Sequedad en los labios o la boca.  Sensacin de [employer] o desvanecimiento, especialmente al ponerse de pie  despus de estar sentado.  Calambres musculares.  Su cuerpo produce: ? Pis (orina) de color oscuro. Orina que puede tener el color del t. ? Bennie Hind menos que lo normal. ? Tiene menos lgrimas que lo normal.  Dolor de [place]. Deshidratacin muy grave  Cambios en la piel. La piel puede: ? Estar fra al tacto (pegajosa). ? Tener manchas o estar plida. ? No volver a la normalidad de inmediato despus de pellizcarla ligeramente y soltarla.  Escasa produccin o ausencia de lgrimas, orina o sudor.  Cambios en los signos vitales, por ejemplo: ? Respiracin acelerada. ? Presin arterial baja. ? Pulso dbil. ? Pulso que supera los 100latidos por minuto cuando est sentado y [place].  Otros cambios, por ejemplo: ? Catering manager sed. ? Ojos que se ven huecos (hundidos). ? Manos y pies fros. ? Estar desorientado (confundido). ? Estar muy cansado (aletargado) o tener problemas para despertarse. ? Prdida de peso a Control and instrumentation engineer. ? Prdida de la conciencia. Cmo se trata? El tratamiento de esta afeccin depende de su gravedad. El tratamiento debe comenzar de inmediato. No espere hasta que su afeccin sea muy grave. La deshidratacin muy grave es Engineer, maintenance (IT). Tendr que ir al hospital.  La deshidratacin leve o grave puede tratarse en la casa. Le pedirn que: ? Beba ms lquidos. ? Beba una solucin de rehidratacin oral (SRO). Esta bebida ayuda a reponer las cantidades correctas de lquidos y de sales y Optometrist en la sangre (electrolitos).  La deshidratacin muy grave puede tratarse: ? Con lquidos a travs de un tubo (catter) intravenoso. ? Restableciendo los [employer] de sales y Public librarian. A menudo, para esto, se administran sales y [employer] a travs de un  tubo. El tubo se introduce a travs de la nariz hasta el estmago. ? Tratando el origen del problema. Siga estas instrucciones en su casa: Solucin de rehidratacin oral Si se lo indica el mdico, beba  una SRO:  Prepare una SRO. Use las instrucciones del envase.  Comience por beber pequeas cantidades, aproximadamente taza (120ml) cada 5 a 10minutos.  Beba de a poco hasta llegar a la cantidad indicada por el mdico. Comida y bebida         Beba suficiente lquido transparente como para mantener la orina de color amarillo plido. Si le indicaron que tome una SRO, termine primero la solucin. Luego, comience a beber lentamente otros lquidos transparentes. Beba lquidos, por ejemplo: ? Agua. No beba solamente agua. Esto puede hacer que los niveles de sal (sodio) en el organismo bajen demasiado. ? Agua de trocitos de hielo que usted succiona. ? Jugo de frutas con agregado de agua (diluido). ? Bebidas deportivas de bajas caloras.  Consuma alimentos que contengan la cantidad adecuada de sales y minerales, por ejemplo: ? Bananas. ? [place]. ? Papas. ? [place]. ? [place].  No beba alcohol.  Evite lo siguiente: ? Bebidas que contengan gran cantidad de azcar. Esto incluye lo siguiente:  Bebidas deportivas ricas en caloras.  Jugo de frutas sin agregado de agua.  Gaseosas.  Cafena. ? Alimentos grasos o que contienen mucha grasa o azcar. Instrucciones generales  Use los medicamentos de venta libre y los recetados solamente como se lo haya indicado el mdico.  No tome comprimidos de sal. Esto puede hacer que los niveles de sal en el cuerpo suban demasiado.  Retome sus actividades normales segn lo indicado por el mdico. Pregntele al mdico qu actividades son seguras para usted.  Concurra a todas las visitas de seguimiento como se lo haya indicado el mdico. Esto es importante. Comunquese con un mdico si:  Tiene dolor de vientre (abdomen) y el dolor: ? Empeora. ? Permanece en un solo lugar.  Tiene una erupcin cutnea.  Presenta rigidez en el cuello.  Se siente enojado o molesto (irritable) con ms facilidad de lo normal.  Est ms cansado o le cuesta  despertarse ms de lo normal.  Presenta los siguientes sntomas: ? Debilidad o mareos. ? Mucha sed. Solicite ayuda inmediatamente si tiene:  Algn sntoma de deshidratacin muy grave.  Sntomas de vmitos, por ejemplo: ? No puede comer ni beber sin vomitar. ? Los vmitos empeoran o no desaparecen. ? Su vmito tiene sangre o una sustancia verde.  Sntomas que empeoran con el tratamiento.  Fiebre.  Dolor de cabeza muy intenso.  Problemas para orinar o para defecar (tener deposiciones), como: ? Heces acuosas que empeoran o que no desaparecen. ? Sangre en las heces (deposiciones). Esto puede hacer que la materia fecal sea negra y tenga aspecto alquitranado. ? No orinar durante 6 a 8 horas. ? Hacer una cantidad pequea de orina muy oscura en el trmino de 6 a 8horas.  Dificultad para respirar. Estos sntomas pueden indicar una emergencia. No espere a ver si los sntomas desaparecen. Solicite atencin mdica de inmediato. Comunquese con el servicio de emergencias de su localidad (911 en [place]). No conduzca por sus propios medios hasta el hospital. Resumen  La deshidratacin es una afeccin que se caracteriza por una cantidad insuficiente de agua u otros lquidos en el organismo. Esto sucede cuando una persona pierde ms lquidos de los que consume.  El tratamiento de esta afeccin depende de su gravedad. El tratamiento se debe comenzar   de inmediato. No espere hasta que su afeccin sea muy grave.  Beba suficiente lquido transparente como para mantener la orina de color amarillo plido. Si le indicaron que tome una solucin de rehidratacin oral (SRO), termine primero la solucin. Luego, comience a beber lentamente otros lquidos transparentes.  Use los medicamentos de venta libre y los recetados solamente como se lo haya indicado el mdico.  Obtenga ayuda de inmediato si tiene algn sntoma de deshidratacin muy grave. Esta informacin no tiene Marine scientist el  consejo del mdico. Asegrese de hacerle al mdico cualquier pregunta que tenga. Document Revised: 01/16/2019 Document Reviewed: 01/16/2019 Elsevier Patient Education  [place]. ? ars than normal.

## 2019-09-15 NOTE — Progress Notes (Signed)
  [facility] OFF[facility] PROGRESS NOTE   Diagnosis: Gastric cancer  INTERVAL HISTORY:   [facility] returns as scheduled.  An interpreter is present for today's visit.  He completed cycle 1 FOLFOX 09/04/2019.  He was seen in the office on 09/12/2019 for routine follow-up.  He was noted to have mucositis.  He appeared dehydrated.  He received IV fluids.  He reports he is feeling better.  He is no longer having mouth or throat pain.  He is tolerating liquids and some soft solids.  He vomits occasionally.  No diarrhea.  No hand or foot pain or redness.  Objective:  Vital signs in last 24 hours:  Temperature 98.5, heart rate 99, respirations 18, blood pressure 100/69  HEENT: No thrush or ulcers.  Previously noted lip ulcers have healed. GI: Abdomen soft and nontender.  No hepatomegaly. Vascular: No leg edema. Skin: Palms without erythema. Port-A-Cath without erythema.   Lab Results:  Lab Results  Component Value Date   WBC 7.3 09/12/2019   HGB 11.5 (L) 09/12/2019   HCT 34.0 (L) 09/12/2019   MCV 81.1 09/12/2019   PLT 346 09/12/2019   NEUTROABS 5.4 09/12/2019    Imaging:  No results found.  Medications: I have reviewed the patient's current medications.  Assessment/Plan: 1. Metastatic gastric cancer  EGD 08/15/2019-large ulcerated lesion in the gastric cardia immediately below the GE junction, malignant appearing.  Biopsy-adenocarcinoma, poorly differentiated; HER-2 negative, PD1 combined positive score 3  CT abdomen/pelvis August 25, 2019-large centrally necrotic mass left upper quadrant of the abdomen measuring 12.2 x 9.0 x 8.6 cm, thick irregular enhancing walls, central low density and multiple central air bubbles; the mass is located between the liver, stomach and pancreas and appears inseparable from the lesser curvature of the stomach. Multiple liver lesions throughout all segments. Metastasis within the caudate lobe of the liver exerts mild mass-effect on  the IVC which remains patent. Multiple small lymph nodes in the gastrohepatic ligament and retroperitoneum, not pathologically enlarged.  Cycle 1 FOLFOX 09/04/2019 2. Abdominal pain secondary to #1  3. Dysphagia/odynophagia secondary to #1  4. Anorexia/weight loss secondary to #1  5. Diabetes 6. Mucositis secondary to chemotherapy.  Improved 09/15/2019.  Disposition: [facility] has completed 1 cycle of FOLFOX.  He developed mucositis, became dehydrated.  He received IV fluids on 09/12/2019 and again today.  He appears improved.  The mouth ulcers have healed.  He is tolerating liquids and soft solids without difficulty.  He will continue the same.  He will return as scheduled on 09/18/2019 for cycle 2 FOLFOX.  5-fluorouracil will be dose reduced.  Nivolumab to be added with cycle 2.    Ned Card ANP/GNP-BC   09/15/2019  9:51 AM

## 2019-09-16 ENCOUNTER — Inpatient Hospital Stay (HOSPITAL_COMMUNITY)
Admission: EM | Admit: 2019-09-16 | Discharge: 2019-09-19 | DRG: 871 | Disposition: A | Discharge status: Home / Self Care | Payer: Self-pay | Attending: Internal Medicine | Admitting: Internal Medicine

## 2019-09-16 ENCOUNTER — Other Ambulatory Visit: Payer: Self-pay

## 2019-09-16 ENCOUNTER — Telehealth: Payer: Self-pay | Admitting: Nurse Practitioner

## 2019-09-16 ENCOUNTER — Encounter (HOSPITAL_COMMUNITY): Payer: Self-pay | Admitting: Emergency Medicine

## 2019-09-16 ENCOUNTER — Emergency Department (HOSPITAL_COMMUNITY): Payer: Self-pay

## 2019-09-16 DIAGNOSIS — Z794 Long term (current) use of insulin: Secondary | ICD-10-CM

## 2019-09-16 DIAGNOSIS — D6481 Anemia due to antineoplastic chemotherapy: Secondary | ICD-10-CM | POA: Diagnosis present

## 2019-09-16 DIAGNOSIS — K5900 Constipation, unspecified: Secondary | ICD-10-CM | POA: Diagnosis present

## 2019-09-16 DIAGNOSIS — R64 Cachexia: Secondary | ICD-10-CM | POA: Diagnosis present

## 2019-09-16 DIAGNOSIS — E43 Unspecified severe protein-calorie malnutrition: Secondary | ICD-10-CM | POA: Diagnosis present

## 2019-09-16 DIAGNOSIS — J690 Pneumonitis due to inhalation of food and vomit: Secondary | ICD-10-CM | POA: Diagnosis present

## 2019-09-16 DIAGNOSIS — C161 Malignant neoplasm of fundus of stomach: Secondary | ICD-10-CM | POA: Diagnosis present

## 2019-09-16 DIAGNOSIS — C787 Secondary malignant neoplasm of liver and intrahepatic bile duct: Secondary | ICD-10-CM | POA: Diagnosis present

## 2019-09-16 DIAGNOSIS — E871 Hypo-osmolality and hyponatremia: Secondary | ICD-10-CM | POA: Diagnosis present

## 2019-09-16 DIAGNOSIS — D709 Neutropenia, unspecified: Secondary | ICD-10-CM | POA: Diagnosis present

## 2019-09-16 DIAGNOSIS — G4700 Insomnia, unspecified: Secondary | ICD-10-CM

## 2019-09-16 DIAGNOSIS — T451X5A Adverse effect of antineoplastic and immunosuppressive drugs, initial encounter: Secondary | ICD-10-CM | POA: Diagnosis present

## 2019-09-16 DIAGNOSIS — C169 Malignant neoplasm of stomach, unspecified: Secondary | ICD-10-CM | POA: Diagnosis present

## 2019-09-16 DIAGNOSIS — Z681 Body mass index (BMI) 19 or less, adult: Secondary | ICD-10-CM

## 2019-09-16 DIAGNOSIS — Z825 Family history of asthma and other chronic lower respiratory diseases: Secondary | ICD-10-CM

## 2019-09-16 DIAGNOSIS — Z87891 Personal history of nicotine dependence: Secondary | ICD-10-CM

## 2019-09-16 DIAGNOSIS — R5081 Fever presenting with conditions classified elsewhere: Secondary | ICD-10-CM | POA: Diagnosis present

## 2019-09-16 DIAGNOSIS — E119 Type 2 diabetes mellitus without complications: Secondary | ICD-10-CM | POA: Diagnosis present

## 2019-09-16 DIAGNOSIS — Z20822 Contact with and (suspected) exposure to covid-19: Secondary | ICD-10-CM | POA: Diagnosis present

## 2019-09-16 DIAGNOSIS — K1231 Oral mucositis (ulcerative) due to antineoplastic therapy: Secondary | ICD-10-CM | POA: Diagnosis present

## 2019-09-16 DIAGNOSIS — A419 Sepsis, unspecified organism: Principal | ICD-10-CM | POA: Diagnosis present

## 2019-09-16 LAB — CBC WITH DIFFERENTIAL/PLATELET
Abs Immature Granulocytes: 0.01 10*3/uL (ref 0.00–0.07)
Basophils Absolute: 0 10*3/uL (ref 0.0–0.1)
Basophils Relative: 0 %
Eosinophils Absolute: 0 10*3/uL (ref 0.0–0.5)
Eosinophils Relative: 2 %
HCT: 29.7 % — ABNORMAL LOW (ref 39.0–52.0)
Hemoglobin: 10.1 g/dL — ABNORMAL LOW (ref 13.0–17.0)
Immature Granulocytes: 1 %
Lymphocytes Relative: 40 %
Lymphs Abs: 0.5 10*3/uL — ABNORMAL LOW (ref 0.7–4.0)
MCH: 27.6 pg (ref 26.0–34.0)
MCHC: 34 g/dL (ref 30.0–36.0)
MCV: 81.1 fL (ref 80.0–100.0)
Monocytes Absolute: 0.3 10*3/uL (ref 0.1–1.0)
Monocytes Relative: 19 %
Neutro Abs: 0.5 10*3/uL — ABNORMAL LOW (ref 1.7–7.7)
Neutrophils Relative %: 38 %
Platelets: 302 10*3/uL (ref 150–400)
RBC: 3.66 MIL/uL — ABNORMAL LOW (ref 4.22–5.81)
RDW: 13.9 % (ref 11.5–15.5)
WBC: 1.4 10*3/uL — CL (ref 4.0–10.5)
nRBC: 0 % (ref 0.0–0.2)

## 2019-09-16 LAB — COMPREHENSIVE METABOLIC PANEL
ALT: 19 U/L (ref 0–44)
AST: 45 U/L — ABNORMAL HIGH (ref 15–41)
Albumin: 2.8 g/dL — ABNORMAL LOW (ref 3.5–5.0)
Alkaline Phosphatase: 112 U/L (ref 38–126)
Anion gap: 9 (ref 5–15)
BUN: 5 mg/dL — ABNORMAL LOW (ref 8–23)
CO2: 23 mmol/L (ref 22–32)
Calcium: 8.3 mg/dL — ABNORMAL LOW (ref 8.9–10.3)
Chloride: 100 mmol/L (ref 98–111)
Creatinine, Ser: 0.56 mg/dL — ABNORMAL LOW (ref 0.61–1.24)
GFR calc Af Amer: >60 mL/min (ref >60)
GFR calc non Af Amer: >60 mL/min (ref >60)
Glucose, Bld: 139 mg/dL — ABNORMAL HIGH (ref 70–99)
Potassium: 3.6 mmol/L (ref 3.5–5.1)
Sodium: 132 mmol/L — ABNORMAL LOW (ref 135–145)
Total Bilirubin: 0.6 mg/dL (ref 0.3–1.2)
Total Protein: 6.2 g/dL — ABNORMAL LOW (ref 6.5–8.1)

## 2019-09-16 LAB — APTT: aPTT: 46 seconds — ABNORMAL HIGH (ref 24–36)

## 2019-09-16 LAB — PROTIME-INR
INR: 1.3 — ABNORMAL HIGH (ref 0.8–1.2)
Prothrombin Time: 15.2 seconds (ref 11.4–15.2)

## 2019-09-16 LAB — LACTIC ACID, PLASMA: Lactic Acid, Venous: 1.1 mmol/L (ref 0.5–1.9)

## 2019-09-16 MED ORDER — METRONIDAZOLE IN NACL 5-0.79 MG/ML-% IV SOLN
500.0000 mg | Freq: Once | INTRAVENOUS | Status: AC
  Administered 2019-09-16: 500 mg via INTRAVENOUS
  Filled 2019-09-16: qty 100

## 2019-09-16 MED ORDER — SODIUM CHLORIDE 0.9 % IV SOLN
2.0000 g | Freq: Once | INTRAVENOUS | Status: AC
  Administered 2019-09-16: 2 g via INTRAVENOUS
  Filled 2019-09-16: qty 2

## 2019-09-16 MED ORDER — VANCOMYCIN HCL IN DEXTROSE 1-5 GM/200ML-% IV SOLN
1000.0000 mg | Freq: Once | INTRAVENOUS | Status: AC
  Administered 2019-09-16: 1000 mg via INTRAVENOUS
  Filled 2019-09-16: qty 200

## 2019-09-16 MED ORDER — SODIUM CHLORIDE 0.9 % IV BOLUS (SEPSIS)
1000.0000 mL | Freq: Once | INTRAVENOUS | Status: AC
  Administered 2019-09-16: 1000 mL via INTRAVENOUS

## 2019-09-16 NOTE — ED Triage Notes (Signed)
Patient arrives via EMS from home with complaints of weakness. Patient seen for same yesterday. Patient now has fever and weakness has gotten progressively worse. Patient is spanish speaking only.

## 2019-09-16 NOTE — ED Provider Notes (Signed)
Emergency Department Provider Note   I have reviewed the triage vital signs and the nursing notes.   HISTORY  Chief Complaint Weakness and Fever  Video Spanish interpreter used for HPI, ROS, and exam.   HPI Alexander Lawson is a 74 y.o. male with PMH of DM and gastric cancer s/p cycle 1 FLOFOX on 09/04/19 presents to the emergency department with cough, fever, generalized weakness.  Symptoms began this morning and have progressively worsened.  He denies chest or abdominal pain.  He does have some associated nausea but no vomiting.  He denies any diarrhea.  He is experiencing some mild to moderate constipation.  Denies any headache or neck pain.  Cough is nonproductive.  No radiation of symptoms or other modifying factors.  He has been into the oncology office to receive IV fluids since his chemotherapy ended and was reportedly feeling better.  He developed some mucositis but that reportedly improved and he was again tolerating liquids PO. He has NOT received the COVID 19 vaccination. No sick contacts.   Per EMS the patient has an initial BP in the 80s with improved with 500 mL of IVF given en route to SBP 110.    Past Medical History:  Diagnosis Date  . Cancer ([place])    pancreas  . Diabetes mellitus without complication [facility])     Patient Active Problem List   Diagnosis Date Noted  . Aspiration pneumonia ([place]) 09/17/2019  . Sepsis ([place]) 09/17/2019  . Neutropenia with fever ([place]) 09/17/2019  . Anemia associated with chemotherapy 09/17/2019  . 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 Gastric cancer ([place]) 08/29/2019  . Goals of care, counseling/discussion 08/29/2019    Past Surgical History:  Procedure Laterality Date  . IR IMAGING GUIDED PORT INSERTION  09/03/2019    Allergies Patient has no known allergies.  Family History  Problem Relation Age of Onset  . Asthma Mother     Social History Social History   Tobacco Use  . Smoking status: Former  Research scientist (life sciences)  . Smokeless tobacco: Never Used  Substance Use Topics  . Alcohol use: Not Currently  . Drug use: Not Currently    Review of Systems  Constitutional: Positive fever/chills and generalized weakness.  Eyes: No visual changes. ENT: No sore throat. Cardiovascular: Denies chest pain. Respiratory: Denies shortness of breath. Positive cough.  Gastrointestinal: No abdominal pain. Positive nausea, no vomiting.  No diarrhea.  No constipation. Genitourinary: Negative for dysuria. Musculoskeletal: Negative for back pain. Skin: Negative for rash. Neurological: Negative for headaches, focal weakness or numbness.  10-point ROS otherwise negative.  ____________________________________________   PHYSICAL EXAM:  VITAL SIGNS: Vitals:   09/17/19 0538 09/17/19 0850  BP: 107/67 123/70  Pulse: 68 80  Resp: 16 13  Temp: 97.6 F (36.4 C) 98.6 F (37 C)  SpO2: 100% 100%    Constitutional: Alert and oriented. Thin and chronically ill-appearing.  Eyes: Conjunctivae are normal.  Head: Atraumatic. Nose: No congestion/rhinnorhea. Mouth/Throat: Mucous membranes are moist. Neck: No stridor. Cardiovascular: Normal rate, regular rhythm. Good peripheral circulation. Grossly normal heart sounds.   Respiratory: Normal respiratory effort.  No retractions. Lungs CTAB. Gastrointestinal: Soft and nontender. No distention.  Musculoskeletal: No lower extremity tenderness nor edema. No gross deformities of extremities. Neurologic:  Normal speech and language. No gross focal neurologic deficits are appreciated.  Skin:  Skin is warm, dry and intact. No rash noted.   ____________________________________________   LABS (all labs ordered are listed, but only abnormal results are  displayed)  Labs Reviewed  COMPREHENSIVE METABOLIC PANEL - Abnormal; Notable for the following components:      Result Value   Sodium 132 (*)    Glucose, Bld 139 (*)    BUN 5 (*)    Creatinine, Ser 0.56 (*)    Calcium  8.3 (*)    Total Protein 6.2 (*)    Albumin 2.8 (*)    AST 45 (*)    All other components within normal limits  CBC WITH DIFFERENTIAL/PLATELET - Abnormal; Notable for the following components:   WBC 1.4 (*)    RBC 3.66 (*)    Hemoglobin 10.1 (*)    HCT 29.7 (*)    Neutro Abs 0.5 (*)    Lymphs Abs 0.5 (*)    All other components within normal limits  APTT - Abnormal; Notable for the following components:   aPTT 46 (*)    All other components within normal limits  PROTIME-INR - Abnormal; Notable for the following components:   INR 1.3 (*)    All other components within normal limits  HEMOGLOBIN A1C - Abnormal; Notable for the following components:   Hgb A1c MFr Bld 7.7 (*)    All other components within normal limits  CBC WITH DIFFERENTIAL/PLATELET - Abnormal; Notable for the following components:   WBC 2.6 (*)    RBC 3.42 (*)    Hemoglobin 9.3 (*)    HCT 28.4 (*)    Neutro Abs 0.6 (*)    All other components within normal limits  VITAMIN B12 - Abnormal; Notable for the following components:   Vitamin B-12 3,770 (*)    All other components within normal limits  IRON AND TIBC - Abnormal; Notable for the following components:   Iron 10 (*)    TIBC 144 (*)    Saturation Ratios 7 (*)    All other components within normal limits  FERRITIN - Abnormal; Notable for the following components:   Ferritin 350 (*)    All other components within normal limits  RETICULOCYTES - Abnormal; Notable for the following components:   RBC. 3.41 (*)    All other components within normal limits  OCCULT BLOOD X 1 CARD TO LAB, STOOL - Abnormal; Notable for the following components:   Fecal Occult Bld POSITIVE (*)    All other components within normal limits  GLUCOSE, CAPILLARY - Abnormal; Notable for the following components:   Glucose-Capillary 125 (*)    All other components within normal limits  GLUCOSE, CAPILLARY - Abnormal; Notable for the following components:   Glucose-Capillary 61 (*)     All other components within normal limits  GLUCOSE, CAPILLARY - Abnormal; Notable for the following components:   Glucose-Capillary 123 (*)    All other components within normal limits  RESPIRATORY PANEL BY RT PCR (FLU A&B, COVID)  CULTURE, BLOOD (ROUTINE X 2)  CULTURE, BLOOD (ROUTINE X 2)  URINE CULTURE  LACTIC ACID, PLASMA  URINALYSIS, ROUTINE W REFLEX MICROSCOPIC  FOLATE  GLUCOSE, CAPILLARY  GLUCOSE, CAPILLARY   ____________________________________________  EKG   EKG Interpretation  Date/Time:  Tuesday Sep 16 2019 21:14:20 EDT Ventricular Rate:  98 PR Interval:    QRS Duration: 102 QT Interval:  340 QTC Calculation: 435 R Axis:   -22 Text Interpretation: Sinus rhythm Borderline left axis deviation Borderline low voltage, extremity leads No STEMI Confirmed by Nanda Quinton [phone]) on 09/16/2019 9:30:15 PM       ____________________________________________  RADIOLOGY  CT Chest Wo Contrast  Result  Date: 09/16/2019 CLINICAL DATA:  Persistent artifact versus pneumothorax on plain film with cough and fever EXAM: CT CHEST WITHOUT CONTRAST TECHNIQUE: Multidetector CT imaging of the chest was performed following the standard protocol without IV contrast. COMPARISON:  CT abdomen pelvis 08/25/2019, radiograph 09/16/2019. FINDINGS: Cardiovascular: Normal heart size. No pericardial effusion. Accessed right IJ Port-A-Cath tip positioned at the right atrium. Few calcifications at the aortic leaflets. Coronary calcifications are present as well. Atherosclerotic plaque within the normal caliber aorta. Normal 3 vessel branching of the aortic arch. Central pulmonary arteries are normal caliber. Mediastinum/Nodes: No mediastinal fluid or gas. Normal thyroid gland and thoracic inlet. No acute abnormality of the trachea or esophagus. Few calcified left hilar nodes, likely sequela of granulomatous disease. No worrisome mediastinal or axillary adenopathy. Hilar nodal evaluation is limited in the absence  of intravenous contrast media. Lungs/Pleura: Few scattered solid calcified nodules throughout the lungs include: 4 mm subpleural nodule in the left lung apex (7/31, solid irregular 4 mm calcified nodule more medially left apex (7/30), calcified 7 mm ureter subpleural node in the lingula (7/108), 3 mm subpleural nodule along the right heart border in the right middle lobe (7/108). Some bandlike areas of scarring present in the lung apices. Mild airways thickening more pronounced towards the lower lungs. Dependent atelectasis posteriorly with more dense opacity in the periphery of the right lung base which could reflect further subsegmental atelectasis though some underlying consolidation or aspiration is a possibility as well. Upper Abdomen: Multiple hypoattenuating lesions seen throughout the liver. Redemonstration of the large air and fluid containing mass arising from the pancreatic body measuring up to the 11.4 x 8.8 cm on this exam and involving the lesser curvature of the stomach and gastric fundus. Musculoskeletal: Multilevel degenerative changes are present in the imaged portions of the spine. No acute osseous abnormality or suspicious osseous lesion. No worrisome chest wall lesions. Paucity of subcutaneous fat. Right chest wall port catheter. IMPRESSION: 1. No pneumothorax. Previously seen artifact likely reflected a skin fold. 2. Dependent atelectasis posteriorly with more dense opacity in the periphery of the right lung base which could reflect further subsegmental atelectasis though some underlying consolidation or aspiration is a possibility particularly given the involvement of the gastric fundus and lesser curvature by the large air and fluid containing pancreatic mass better detailed on comparison CT abdomen pelvis. 3. Multiple hypoattenuating lesions throughout the liver, compatible with metastatic disease. 4. Calcified left hilar nodes with several scattered sub 5 mm calcified and noncalcified  nodules throughout the lungs. May reflect sequela of granulomatous disease though continued attention on follow-up imaging is recommended in the setting of known malignancy. 5. Accessed right IJ Port-A-Cath tip positioned at the right atrium. 6. Aortic Atherosclerosis (ICD10-I70.0). Electronically Signed   By: Lovena Le M.D.   On: 09/16/2019 23:04   DG Chest Port 1 View  Result Date: 09/16/2019 CLINICAL DATA:  74 year old male with fever. EXAM: PORTABLE CHEST 1 VIEW COMPARISON:  None. FINDINGS: The lungs are clear. There is no pleural effusion. Linear lucency along the peripheral right lung, likely artifactual and related to skin fold. A pneumothorax is less likely. A 4 mm left lung base calcified granuloma. The cardiac silhouette is within limits. Left hilar calcified gland normal noted. Right-sided Port-A-Cath with tip at the cavoatrial junction. No acute osseous pathology. IMPRESSION: 1. No focal consolidation. 2. Artifact versus less likely a right-sided pneumothorax. Electronically Signed   By: Anner Crete M.D.   On: 09/16/2019 21:27    ____________________________________________  PROCEDURES  Procedure(s) performed:   .Critical Care Performed by: Margette Fast, MD Authorized by: Margette Fast, MD   Critical care provider statement:    Critical care time (minutes):  35   Critical care time was exclusive of:  Separately billable procedures and treating other patients and teaching time   Critical care was necessary to treat or prevent imminent or life-threatening deterioration of the following conditions:  Sepsis   Critical care was time spent personally by me on the following activities:  Discussions with consultants, evaluation of patient's response to treatment, examination of patient, ordering and performing treatments and interventions, ordering and review of laboratory studies, ordering and review of radiographic studies, pulse oximetry, re-evaluation of patient's condition,  obtaining history from patient or surrogate, review of old charts, blood draw for specimens and development of treatment plan with patient or surrogate   I assumed direction of critical care for this patient from another provider in my specialty: no       ____________________________________________   INITIAL IMPRESSION / ASSESSMENT AND PLAN / ED COURSE  Pertinent labs & imaging results that were available during my care of the patient were reviewed by me and considered in my medical decision making (see chart for details).   Patient presents to the emergency department evaluation of fever, weakness, cough in the setting of recent chemotherapy.  He finished his FLOFOX cycle on 4/22.  He has not been vaccinated for COVID-19.  His abdomen is soft and nontender.  No distention.  Lungs are clear.  Initiating sepsis protocol with IV fluids and cefepime along with vancomycin with fever with recent chemo. COVID test ordered as well. Some hypotension on scene with EMS but improved with IVF. Will continue resuscitation and reassess after labs and chest x-ray.   Labs with neutropenia and normal lactate. Broad spectrum abx and IVF given. No hypotension. CXR with likely artifact vs PNX. CT chest w/o contrast sent with question aspiration. Added Flagyl.   Discussed patient's case with [facility] to request admission. Patient and family (if present) updated with plan. Care transferred to [facility] service.  I reviewed all nursing notes, vitals, pertinent old records, EKGs, labs, imaging (as available).  ____________________________________________  FINAL CLINICAL IMPRESSION(S) / ED DIAGNOSES  Final diagnoses:  Neutropenic fever ([place])     MEDICATIONS GIVEN DURING THIS VISIT:  Medications  oxyCODONE (Oxy IR/ROXICODONE) immediate release tablet 5 mg (has no administration in time range)  prochlorperazine (COMPAZINE) tablet 10 mg (has no administration in time range)  magic mouthwash (has no administration in  time range)  insulin aspart (novoLOG) injection 0-9 Units (0 Units Subcutaneous Not Given 09/17/19 1159)  enoxaparin (LOVENOX) injection 40 mg (40 mg Subcutaneous Given 09/17/19 0146)  acetaminophen (TYLENOL) tablet 650 mg (has no administration in time range)    Or  acetaminophen (TYLENOL) suppository 650 mg (has no administration in time range)  0.9 %  sodium chloride infusion ( Intravenous Rate/Dose Verify 09/17/19 0616)  piperacillin-tazobactam (ZOSYN) IVPB 3.375 g ( Intravenous Rate/Dose Verify 09/17/19 0616)  vancomycin (VANCOREADY) IVPB 500 mg/100 mL (500 mg Intravenous New Bag/Given 09/17/19 1036)  polyethylene glycol (MIRALAX / GLYCOLAX) packet 17 g (17 g Oral Not Given 09/17/19 1036)  Chlorhexidine Gluconate Cloth 2 % PADS 6 each (6 each Topical Given 09/17/19 1037)  sodium chloride flush (NS) 0.9 % injection 10-40 mL (10 mLs Intracatheter Not Given 09/17/19 1037)  sodium chloride flush (NS) 0.9 % injection 10-40 mL (has no administration in time range)  sodium chloride  0.9 % bolus 1,000 mL (0 mLs Intravenous Stopped 09/16/19 2232)  ceFEPIme (MAXIPIME) 2 g in sodium chloride 0.9 % 100 mL IVPB (0 g Intravenous Stopped 09/16/19 2232)  vancomycin (VANCOCIN) IVPB 1000 mg/200 mL premix ( Intravenous Stopped 09/16/19 2329)  metroNIDAZOLE (FLAGYL) IVPB 500 mg ( Intravenous Stopped 09/17/19 0052)  dextrose 50 % solution 12.5 g (12.5 g Intravenous Given 09/17/19 0804)     Note:  This document was prepared using Dragon voice recognition software and may include unintentional dictation errors.  Nanda Quinton, MD, [facility] Emergency Medicine    Harvest Stanco, [place], MD 09/17/19 1247

## 2019-09-16 NOTE — Progress Notes (Signed)
A consult was received from an ED physician for Vancomycin and Cefepime per pharmacy dosing.  The patient's profile has been reviewed for ht/wt/allergies/indication/available labs.    A one time order has already been placed by MD for Vancomycin 1g IV and Cefepime 2g IV (both doses appropriate).  Further antibiotics/pharmacy consults should be ordered by admitting physician if indicated.                       Thank you, Luiz Ochoa 09/16/2019  9:03 PM

## 2019-09-16 NOTE — Telephone Encounter (Signed)
Scheduled per los. Mailed printout  °

## 2019-09-17 ENCOUNTER — Encounter (HOSPITAL_COMMUNITY): Payer: Self-pay | Admitting: Internal Medicine

## 2019-09-17 DIAGNOSIS — R5081 Fever presenting with conditions classified elsewhere: Secondary | ICD-10-CM

## 2019-09-17 DIAGNOSIS — T451X5A Adverse effect of antineoplastic and immunosuppressive drugs, initial encounter: Secondary | ICD-10-CM

## 2019-09-17 DIAGNOSIS — D6481 Anemia due to antineoplastic chemotherapy: Secondary | ICD-10-CM

## 2019-09-17 DIAGNOSIS — A419 Sepsis, unspecified organism: Secondary | ICD-10-CM

## 2019-09-17 DIAGNOSIS — D709 Neutropenia, unspecified: Secondary | ICD-10-CM

## 2019-09-17 DIAGNOSIS — J690 Pneumonitis due to inhalation of food and vomit: Secondary | ICD-10-CM | POA: Diagnosis present

## 2019-09-17 LAB — FERRITIN: Ferritin: 350 ng/mL — ABNORMAL HIGH (ref 24–336)

## 2019-09-17 LAB — GLUCOSE, CAPILLARY
Glucose-Capillary: 125 mg/dL — ABNORMAL HIGH (ref 70–99)
Glucose-Capillary: 77 mg/dL (ref 70–99)
Glucose-Capillary: 61 mg/dL — ABNORMAL LOW (ref 70–99)
Glucose-Capillary: 123 mg/dL — ABNORMAL HIGH (ref 70–99)
Glucose-Capillary: 89 mg/dL (ref 70–99)
Glucose-Capillary: 165 mg/dL — ABNORMAL HIGH (ref 70–99)
Glucose-Capillary: 73 mg/dL (ref 70–99)

## 2019-09-17 LAB — CBC WITH DIFFERENTIAL/PLATELET
Abs Immature Granulocytes: 0.01 10*3/uL (ref 0.00–0.07)
Basophils Absolute: 0 10*3/uL (ref 0.0–0.1)
Basophils Relative: 0 %
Eosinophils Absolute: 0.1 10*3/uL (ref 0.0–0.5)
Eosinophils Relative: 5 %
HCT: 28.4 % — ABNORMAL LOW (ref 39.0–52.0)
Hemoglobin: 9.3 g/dL — ABNORMAL LOW (ref 13.0–17.0)
Immature Granulocytes: 0 %
Lymphocytes Relative: 55 %
Lymphs Abs: 1.4 10*3/uL (ref 0.7–4.0)
MCH: 27.2 pg (ref 26.0–34.0)
MCHC: 32.7 g/dL (ref 30.0–36.0)
MCV: 83 fL (ref 80.0–100.0)
Monocytes Absolute: 0.5 10*3/uL (ref 0.1–1.0)
Monocytes Relative: 18 %
Neutro Abs: 0.6 10*3/uL — ABNORMAL LOW (ref 1.7–7.7)
Neutrophils Relative %: 22 %
Platelets: 262 10*3/uL (ref 150–400)
RBC: 3.42 MIL/uL — ABNORMAL LOW (ref 4.22–5.81)
RDW: 14.1 % (ref 11.5–15.5)
WBC: 2.6 10*3/uL — ABNORMAL LOW (ref 4.0–10.5)
nRBC: 0 % (ref 0.0–0.2)

## 2019-09-17 LAB — URINALYSIS, ROUTINE W REFLEX MICROSCOPIC
Bilirubin Urine: NEGATIVE
Glucose, UA: NEGATIVE mg/dL
Hgb urine dipstick: NEGATIVE
Ketones, ur: NEGATIVE mg/dL
Leukocytes,Ua: NEGATIVE
Nitrite: NEGATIVE
Protein, ur: NEGATIVE mg/dL
Specific Gravity, Urine: 1.008 (ref 1.005–1.030)
pH: 6 (ref 5.0–8.0)

## 2019-09-17 LAB — RESPIRATORY PANEL BY RT PCR (FLU A&B, COVID)
Influenza A by PCR: NEGATIVE
Influenza B by PCR: NEGATIVE
SARS Coronavirus 2 by RT PCR: NEGATIVE

## 2019-09-17 LAB — IRON AND TIBC
Iron: 10 ug/dL — ABNORMAL LOW (ref 45–182)
Saturation Ratios: 7 % — ABNORMAL LOW (ref 17.9–39.5)
TIBC: 144 ug/dL — ABNORMAL LOW (ref 250–450)
UIBC: 134 ug/dL

## 2019-09-17 LAB — OCCULT BLOOD X 1 CARD TO LAB, STOOL: Fecal Occult Bld: POSITIVE — AB

## 2019-09-17 LAB — URINE CULTURE: Culture: NO GROWTH

## 2019-09-17 LAB — HEMOGLOBIN A1C
Hgb A1c MFr Bld: 7.7 % — ABNORMAL HIGH (ref 4.8–5.6)
Mean Plasma Glucose: 174.29 mg/dL

## 2019-09-17 LAB — RETICULOCYTES
Immature Retic Fract: 14.5 % (ref 2.3–15.9)
RBC.: 3.41 MIL/uL — ABNORMAL LOW (ref 4.22–5.81)
Retic Count, Absolute: 58.3 10*3/uL (ref 19.0–186.0)
Retic Ct Pct: 1.7 % (ref 0.4–3.1)

## 2019-09-17 LAB — VITAMIN B12: Vitamin B-12: 3770 pg/mL — ABNORMAL HIGH (ref 180–914)

## 2019-09-17 LAB — FOLATE: Folate: 22.1 ng/mL (ref >5.9)

## 2019-09-17 MED ORDER — MAGIC MOUTHWASH
5.0000 mL | Freq: Four times a day (QID) | ORAL | Status: DC | PRN
  Filled 2019-09-17: qty 5

## 2019-09-17 MED ORDER — SODIUM CHLORIDE 0.9% FLUSH
10.0000 mL | INTRAVENOUS | Status: DC | PRN
  Administered 2019-09-19: 10 mL

## 2019-09-17 MED ORDER — CHLORHEXIDINE GLUCONATE CLOTH 2 % EX PADS
6.0000 | MEDICATED_PAD | Freq: Every day | CUTANEOUS | Status: DC
  Administered 2019-09-17 – 2019-09-19 (×3): 6 via TOPICAL

## 2019-09-17 MED ORDER — PROCHLORPERAZINE MALEATE 10 MG PO TABS: 10.0000 mg | ORAL_TABLET | Freq: Four times a day (QID) | ORAL | Status: DC | PRN

## 2019-09-17 MED ORDER — DEXTROSE 50 % IV SOLN
12.5000 g | INTRAVENOUS | Status: AC
  Administered 2019-09-17: 12.5 g via INTRAVENOUS
  Filled 2019-09-17: qty 50

## 2019-09-17 MED ORDER — ENOXAPARIN SODIUM 40 MG/0.4ML ~~LOC~~ SOLN
40.0000 mg | Freq: Every day | SUBCUTANEOUS | Status: DC
  Administered 2019-09-17 – 2019-09-18 (×3): 40 mg via SUBCUTANEOUS
  Filled 2019-09-17 (×3): qty 0.4

## 2019-09-17 MED ORDER — VANCOMYCIN HCL 500 MG/100ML IV SOLN
500.0000 mg | Freq: Two times a day (BID) | INTRAVENOUS | Status: DC
  Administered 2019-09-17 – 2019-09-19 (×5): 500 mg via INTRAVENOUS
  Filled 2019-09-17 (×5): qty 100

## 2019-09-17 MED ORDER — SODIUM CHLORIDE 0.9 % IV SOLN: INTRAVENOUS | Status: AC

## 2019-09-17 MED ORDER — OXYCODONE HCL 5 MG PO TABS: 5.0000 mg | ORAL_TABLET | Freq: Four times a day (QID) | ORAL | Status: DC | PRN

## 2019-09-17 MED ORDER — ACETAMINOPHEN 650 MG RE SUPP: 650.0000 mg | Freq: Four times a day (QID) | RECTAL | Status: DC | PRN

## 2019-09-17 MED ORDER — SODIUM CHLORIDE 0.9% FLUSH
10.0000 mL | Freq: Two times a day (BID) | INTRAVENOUS | Status: DC
  Administered 2019-09-17: 10 mL

## 2019-09-17 MED ORDER — ENSURE ENLIVE PO LIQD
237.0000 mL | Freq: Two times a day (BID) | ORAL | Status: DC
  Administered 2019-09-18 – 2019-09-19 (×3): 237 mL via ORAL

## 2019-09-17 MED ORDER — PIPERACILLIN-TAZOBACTAM 3.375 G IVPB
3.3750 g | Freq: Three times a day (TID) | INTRAVENOUS | Status: DC
  Administered 2019-09-17 – 2019-09-19 (×7): 3.375 g via INTRAVENOUS
  Filled 2019-09-17 (×8): qty 50

## 2019-09-17 MED ORDER — INSULIN ASPART 100 UNIT/ML ~~LOC~~ SOLN
0.0000 [IU] | SUBCUTANEOUS | Status: DC
  Administered 2019-09-17: 1 [IU] via SUBCUTANEOUS
  Administered 2019-09-17: 2 [IU] via SUBCUTANEOUS
  Administered 2019-09-18: 1 [IU] via SUBCUTANEOUS
  Administered 2019-09-18: 2 [IU] via SUBCUTANEOUS
  Filled 2019-09-17: qty 0.09

## 2019-09-17 MED ORDER — ACETAMINOPHEN 325 MG PO TABS: 650.0000 mg | ORAL_TABLET | Freq: Four times a day (QID) | ORAL | Status: DC | PRN

## 2019-09-17 MED ORDER — POLYETHYLENE GLYCOL 3350 17 G PO PACK
17.0000 g | PACK | Freq: Every day | ORAL | Status: DC
  Administered 2019-09-18 – 2019-09-19 (×2): 17 g via ORAL
  Filled 2019-09-17 (×3): qty 1

## 2019-09-17 MED ORDER — SODIUM CHLORIDE 0.9 % IV SOLN: Freq: Once | INTRAVENOUS | Status: DC

## 2019-09-17 MED FILL — Dexamethasone Sodium Phosphate Inj 100 MG/10ML: INTRAMUSCULAR | Qty: 1 | Status: AC

## 2019-09-17 NOTE — H&P (Signed)
History and Physical    Alexander Lawson [id number] DOB: 05-30-1945 DOA: 09/16/2019  PCP: Patient, No Pcp Per Patient coming from: Home  Chief Complaint: Generalized weakness, fever  HPI: Alexander Lawson is a 74 y.o. male with medical history significant of metastatic gastric cancer status post cycle 1 FOLFOX on 09/04/2019, insulin-dependent diabetes presenting to the ED with complaints of generalized weakness, fever, and cough.  Spanish interpreter services used.  Patient states yesterday he was coughing a lot which is making his chest hurt every time he coughed.  He could not bring up anything when he tried to cough.  Unclear if he had shortness of breath.  He was also having chills and then felt hot.  He is complaining of constipation.  States his last bowel movement was yesterday.  States he had vomiting in the past after chemotherapy but no nausea or vomiting at present.  No additional history could be obtained from the patient.  ED Course: Hypotensive with EMS with systolic in the [id number], improved to systolic [id number] with [id number] cc fluid bolus.  Temperature 100.3 F.  Slightly tachypneic but not hypoxic.  Not tachycardic.  Labs showing leukopenia and neutropenia (WBC count 1.4, ANC 0.5).  WBC count was normal on labs done 5 days ago.  Lactic acid normal.  Hemoglobin 10.1, was 11.5 on labs done 5 days ago.  Platelet count normal.  Sodium 132, mildly low on previous labs as well.  Creatinine 0.5, at baseline.  Blood culture x2 pending.  SARS-CoV-2 PCR test negative.  Influenza panel negative.  UA not suggestive of infection.  Urine culture pending. Chest x-ray showing no focal consolidation.  Artifact versus less likely a right-sided pneumonia thorax. CT chest showing no pneumothorax.  Previously seen artifact likely reflected a skinfold.  Showing dependent atelectasis posteriorly with more dense opacity in the periphery of the right lung base which could reflect further subsegmental atelectasis though  some underlying consolidation or aspiration is a possibility.     Patient received vancomycin, cefepime, metronidazole, and 1 L normal saline bolus.  Review of Systems:  All systems reviewed and apart from history of presenting illness, are negative.  Past Medical History:  Diagnosis Date  . Cancer ([place])    pancreas  . Diabetes mellitus without complication [facility])     Past Surgical History:  Procedure Laterality Date  . IR IMAGING GUIDED PORT INSERTION  09/03/2019     reports that he has quit smoking. He has never used smokeless tobacco. He reports previous alcohol use. He reports previous drug use.  No Known Allergies  Family History  Problem Relation Age of Onset  . Asthma Mother     Prior to Admission medications   Medication Sig Start Date End Date Taking? Authorizing Provider  insulin NPH Human (NOVOLIN N) 100 UNIT/ML injection Inject 5-10 Units into the skin daily as needed (5 units if BS over 150/10 units if BS over 200).   Yes [provider]  lidocaine-prilocaine (EMLA) cream Apply 1 application topically as directed. Apply to port site 1 hour prior to stick and cover with plastic wrap. Start with 2nd chemo treatment 09/02/19  Yes Ladell Pier, MD  magic mouthwash SOLN Take 5 mLs by mouth 4 (four) times daily as needed for mouth pain (swish and spit). 09/10/19  Yes Ladell Pier, MD  oxyCODONE (ROXICODONE) 5 MG immediate release tablet Take 1 tablet (5 mg total) by mouth every 6 (six) hours as needed for up to 20 doses for severe  pain. 08/25/19  Yes Curatolo, Adam, DO  prochlorperazine (COMPAZINE) 10 MG tablet Take 1 tablet (10 mg total) by mouth every 6 (six) hours as needed for nausea. 09/02/19  Yes Ladell Pier, MD  HYDROcodone-acetaminophen (HYCET) 7.5-325 mg/15 ml solution Take 10 mLs by mouth every 6 (six) hours as needed for moderate pain. Patient not taking: Reported on 09/16/2019 08/29/19 08/28/20  Owens Shark, NP    Physical Exam: Vitals:    09/17/19 0030 09/17/19 0045 09/17/19 0100 09/17/19 0115  BP: (!) 110/59 110/67 102/68 112/68  Pulse: 76 77 80 79  Resp:  19 (!) 23 (!) 24  Temp:      TempSrc:      SpO2: 98% 99% 100% 99%    Physical Exam  Constitutional: He is oriented to person, place, and time. No distress.  Cachectic  HENT:  Head: Normocephalic.  Eyes: Right eye exhibits no discharge. Left eye exhibits no discharge.  Cardiovascular: Normal rate, regular rhythm and intact distal pulses.  Pulmonary/Chest: Effort normal. No respiratory distress. He has no wheezes. He has no rales.  Oxygen saturation 100% on room air  Abdominal: Soft. Bowel sounds are normal. He exhibits no distension. There is no abdominal tenderness. There is no guarding.  Passing flatus  Musculoskeletal:        General: No edema.     Cervical back: Neck supple.  Neurological: He is alert and oriented to person, place, and time.  Skin: Skin is warm and dry. He is not diaphoretic.    Labs on Admission: I have personally reviewed following labs and imaging studies  CBC: Recent Labs  Lab 09/12/19 1443 09/16/19 2101  WBC 7.3 1.4*  NEUTROABS 5.4 0.5*  HGB 11.5* 10.1*  HCT 34.0* 29.7*  MCV 81.1 81.1  PLT 346 [id number]   Basic Metabolic Panel: Recent Labs  Lab 09/12/19 1443 09/16/19 2101  NA 131* 132*  K 4.4 3.6  CL 96* 100  CO2 23 23  GLUCOSE 143* 139*  BUN 11 5*  CREATININE 0.66 0.56*  CALCIUM 8.8* 8.3*   GFR: Estimated Creatinine Clearance: 61.4 mL/min (A) (by C-G formula based on SCr of 0.56 mg/dL (L)). Liver Function Tests: Recent Labs  Lab 09/12/19 1443 09/16/19 2101  AST 36 45*  ALT 11 19  ALKPHOS 142* 112  BILITOT 0.7 0.6  PROT 6.9 6.2*  ALBUMIN 2.8* 2.8*   No results for input(s): LIPASE, AMYLASE in the last 168 hours. No results for input(s): AMMONIA in the last 168 hours. Coagulation Profile: Recent Labs  Lab 09/16/19 2101  INR 1.3*   Cardiac Enzymes: No results for input(s): CKTOTAL, CKMB, CKMBINDEX,  TROPONINI in the last 168 hours. BNP (last 3 results) No results for input(s): PROBNP in the last 8760 hours. HbA1C: No results for input(s): HGBA1C in the last 72 hours. CBG: No results for input(s): GLUCAP in the last 168 hours. Lipid Profile: No results for input(s): CHOL, HDL, LDLCALC, TRIG, CHOLHDL, LDLDIRECT in the last 72 hours. Thyroid Function Tests: No results for input(s): TSH, T4TOTAL, FREET4, T3FREE, THYROIDAB in the last 72 hours. Anemia Panel: No results for input(s): VITAMINB12, FOLATE, FERRITIN, TIBC, IRON, RETICCTPCT in the last 72 hours. Urine analysis:    Component Value Date/Time   COLORURINE YELLOW 09/16/2019 2353   APPEARANCEUR CLEAR 09/16/2019 2353   LABSPEC 1.008 09/16/2019 2353   PHURINE 6.0 09/16/2019 2353   GLUCOSEU NEGATIVE 09/16/2019 2353   HGBUR NEGATIVE 09/16/2019 [place] NEGATIVE 09/16/2019 2353   Benjamin Stain  NEGATIVE 09/16/2019 [place] 09/16/2019 2353   NITRITE NEGATIVE 09/16/2019 2353   LEUKOCYTESUR NEGATIVE 09/16/2019 2353    Radiological Exams on Admission: CT Chest Wo Contrast  Result Date: 09/16/2019 CLINICAL DATA:  Persistent artifact versus pneumothorax on plain film with cough and fever EXAM: CT CHEST WITHOUT CONTRAST TECHNIQUE: Multidetector CT imaging of the chest was performed following the standard protocol without IV contrast. COMPARISON:  CT abdomen pelvis 08/25/2019, radiograph 09/16/2019. FINDINGS: Cardiovascular: Normal heart size. No pericardial effusion. Accessed right IJ Port-A-Cath tip positioned at the right atrium. Few calcifications at the aortic leaflets. Coronary calcifications are present as well. Atherosclerotic plaque within the normal caliber aorta. Normal 3 vessel branching of the aortic arch. Central pulmonary arteries are normal caliber. Mediastinum/Nodes: No mediastinal fluid or gas. Normal thyroid gland and thoracic inlet. No acute abnormality of the trachea or esophagus. Few calcified left  hilar nodes, likely sequela of granulomatous disease. No worrisome mediastinal or axillary adenopathy. Hilar nodal evaluation is limited in the absence of intravenous contrast media. Lungs/Pleura: Few scattered solid calcified nodules throughout the lungs include: 4 mm subpleural nodule in the left lung apex (7/31, solid irregular 4 mm calcified nodule more medially left apex (7/30), calcified 7 mm ureter subpleural node in the lingula (7/108), 3 mm subpleural nodule along the right heart border in the right middle lobe (7/108). Some bandlike areas of scarring present in the lung apices. Mild airways thickening more pronounced towards the lower lungs. Dependent atelectasis posteriorly with more dense opacity in the periphery of the right lung base which could reflect further subsegmental atelectasis though some underlying consolidation or aspiration is a possibility as well. Upper Abdomen: Multiple hypoattenuating lesions seen throughout the liver. Redemonstration of the large air and fluid containing mass arising from the pancreatic body measuring up to the 11.4 x 8.8 cm on this exam and involving the lesser curvature of the stomach and gastric fundus. Musculoskeletal: Multilevel degenerative changes are present in the imaged portions of the spine. No acute osseous abnormality or suspicious osseous lesion. No worrisome chest wall lesions. Paucity of subcutaneous fat. Right chest wall port catheter. IMPRESSION: 1. No pneumothorax. Previously seen artifact likely reflected a skin fold. 2. Dependent atelectasis posteriorly with more dense opacity in the periphery of the right lung base which could reflect further subsegmental atelectasis though some underlying consolidation or aspiration is a possibility particularly given the involvement of the gastric fundus and lesser curvature by the large air and fluid containing pancreatic mass better detailed on comparison CT abdomen pelvis. 3. Multiple hypoattenuating lesions  throughout the liver, compatible with metastatic disease. 4. Calcified left hilar nodes with several scattered sub 5 mm calcified and noncalcified nodules throughout the lungs. May reflect sequela of granulomatous disease though continued attention on follow-up imaging is recommended in the setting of known malignancy. 5. Accessed right IJ Port-A-Cath tip positioned at the right atrium. 6. Aortic Atherosclerosis (ICD10-I70.0). Electronically Signed   By: Lovena Le M.D.   On: 09/16/2019 23:04   DG Chest Port 1 View  Result Date: 09/16/2019 CLINICAL DATA:  74 year old male with fever. EXAM: PORTABLE CHEST 1 VIEW COMPARISON:  None. FINDINGS: The lungs are clear. There is no pleural effusion. Linear lucency along the peripheral right lung, likely artifactual and related to skin fold. A pneumothorax is less likely. A 4 mm left lung base calcified granuloma. The cardiac silhouette is within limits. Left hilar calcified gland normal noted. Right-sided Port-A-Cath with tip at the cavoatrial junction. No acute  osseous pathology. IMPRESSION: 1. No focal consolidation. 2. Artifact versus less likely a right-sided pneumothorax. Electronically Signed   By: Anner Crete M.D.   On: 09/16/2019 21:27    EKG: Independently reviewed.  Sinus rhythm, no significant change since prior tracing.  Assessment/Plan Principal Problem:   Aspiration pneumonia ([facility]) Active Problems:   Gastric cancer ([facility])   Sepsis ([place])   Neutropenia with fever ([place])   Anemia associated with chemotherapy   Sepsis secondary to possible aspiration pneumonia: Hypotensive with EMS, blood pressure improved with a 500 cc fluid bolus.  Febrile in the ED.  Slightly tachypneic but not hypoxic.  Labs showing neutropenia (ANC 0.5).  Lactic acid normal. CT chest showing dependent atelectasis posteriorly with more dense opacity in the periphery of the right lung base which could reflect further subsegmental atelectasis though some underlying  consolidation or aspiration is a possibility.  SARS-CoV-2 PCR test negative.  Influenza panel negative. -Continue antibiotic coverage with vancomycin and Zosyn.  Continue IV fluids.  Blood culture x2 pending.  Incentive spirometry.  Continuous pulse ox, supplemental oxygen if needed to keep oxygen saturation above 92%.  Keep n.p.o. and follow aspiration precautions.  SLP eval in a.m.  Febrile neutropenia: Continue broad-spectrum antibiotics as above.  Tylenol as needed for fevers.  Blood culture x2 pending.  Normocytic anemia: Likely related to chemotherapy.  Hemoglobin 10.1, was 11.5 on labs done 5 days ago.  MCV 81.1.  Patient denies hematemesis, hematochezia, or melena. -Check FOBT, order anemia panel, and continue to monitor CBC  Mild hyponatremia: Continue IV fluid hydration and monitor sodium level.  Metastatic gastric cancer: Followed by oncology and status post cycle 1 FOLFOX on 09/04/2019.  Now presenting with febrile neutropenia.  Continue antibiotics as above.  Consult oncology in a.m.  Pulmonary nodules: CT showing calcified left hilar nodes with several scattered sub-5 mm calcified and noncalcified nodules throughout the lungs.  May reflect sequela of granulomatous disease though continued attention on follow-up imaging is recommended in the setting of known malignancy.  Insulin-dependent diabetes: Check A1c.  Sliding scale insulin sensitive every 4 hours as patient is currently n.p.o., pending SLP eval.  Constipation: No complaints of abdominal pain, nausea, or vomiting at present.  Abdominal exam benign. -MiraLAX daily  DVT prophylaxis: Lovenox Code Status: Patient wishes to be full code. Family Communication: Daughter at bedside updated. Disposition Plan: Status is: Inpatient  Remains inpatient appropriate because:IV treatments appropriate due to intensity of illness or inability to take PO   Dispo: The patient is from: Home              Anticipated d/c is to: Home               Anticipated d/c date is: 3 days              Patient currently is not medically stable to d/c.  The medical decision making on this patient was of high complexity and the patient is at high risk for clinical deterioration, therefore this is a level 3 visit.  Shela Leff MD [facility]  If 7PM-7AM, please contact night-coverage [email or web address]  09/17/2019, 1:25 AM

## 2019-09-17 NOTE — Progress Notes (Addendum)
PROGRESS NOTE    Alexander Lawson  [id number] DOB: 1946/02/04 DOA: 09/16/2019 PCP: Patient, No Pcp Per   Brief Narrative: Patient is a 74 year old male with with history of metastatic gastric cancer, currently on chemotherapy, insulin-dependent diabetes who presents to the emergency department with complaints of generalized weakness, fever, cough.  Patient speaks Spanish.  Patient also reported fevers and chills at home.  On presentation he was hypotensive, tachycardic.  Lab work showed leukopenia, neutropenia.  Chest x-ray did not show any focal consolidation but chest CT showed dense opacity in the periphery of the right lung base resting consolidation versus aspiration.  Started on broad-spectrum antibiotics.  Assessment & Plan:   Principal Problem:   Aspiration pneumonia ([place]) Active Problems:   Gastric cancer ([place])   Sepsis ([place])   Neutropenia with fever ([place])   Anemia associated with chemotherapy   Suspected sepsis/aspiration pneumonia: Hypotensive on presentation.  Improved with IV fluids.  He was also febrile on presentation but not hypoxic.  Lactate is normal.  Chest x-ray as above.  Covid negative. Continue current antibiotics, IV fluids, blood cultures are pending.  Speech therapy evaluation.NPO till speech therapy sees him.  Febrile neutropenia: Continue antibiotics as above.  Follow blood cultures.  White cell counts have improved today.  Continue to monitor CBC.  Normocytic anemia: Most likely associated  with chemotherapy.  Severely deficient in iron.  Will hold IV iron until we rule out sepsis.  FOBT is positive but he has gastric malignancy so will not pursue for GI consultation  Metastatic gastric cancer: Follows with oncology.  Status post cycle 1 FOLFOX on 09/04/19.  Pulmonary nodules: CT showing calcified left hilar nodules with severe scattered subcentimeter 5 mm calcified and noncalcified nodules throughout the lungs.  Follow-up imaging as an  outpatient.  Insulin-dependent diabetes mellitus: Currently on sliding scale insulin.  Patient is n.p.o. pending speech evaluation.  Hemoglobin A1c is 7.7  Constipation: Continue MiraLAX  Severe protein calorie malnutrition: BMI of 17.  Albumin of 2.8.  Will request for nutrition consult.            DVT prophylaxis:Lovenox Code Status: Full Family Communication: Discussed with daughter on phone on 09/17/2019 Status is: Inpatient  Remains inpatient appropriate because:IV treatments appropriate due to intensity of illness or inability to take PO   Dispo: The patient is from: Home              Anticipated d/c is to: Home              Anticipated d/c date is: 2 days              Patient currently is not medically stable to d/c.    Consultants: None  Procedures:None  Antimicrobials:  Anti-infectives (From admission, onward)   Start     Dose/Rate Route Frequency Ordered Stop   09/17/19 1000  vancomycin (VANCOREADY) IVPB 500 mg/100 mL     500 mg 100 mL/hr over 60 Minutes Intravenous Every 12 hours 09/17/19 0057     09/17/19 0600  piperacillin-tazobactam (ZOSYN) IVPB 3.375 g     3.375 g 12.5 mL/hr over 240 Minutes Intravenous Every 8 hours 09/17/19 0055     09/16/19 2330  metroNIDAZOLE (FLAGYL) IVPB 500 mg     500 mg 100 mL/hr over 60 Minutes Intravenous  Once 09/16/19 2329 09/17/19 0052   09/16/19 2115  ceFEPIme (MAXIPIME) 2 g in sodium chloride 0.9 % 100 mL IVPB     2 g 200 mL/hr over 30  Minutes Intravenous  Once 09/16/19 2101 09/16/19 2232   09/16/19 2115  vancomycin (VANCOCIN) IVPB 1000 mg/200 mL premix     1,000 mg 200 mL/hr over 60 Minutes Intravenous  Once 09/16/19 2101 09/16/19 2329      Subjective: Patient seen and examined at the bedside this morning.  Looks comfortable today.  Feels better.  Blood pressure is better today, afebrile.  Denies any shortness of breath or abdominal pain.  I called the daughter and updated the situation.  Objective: Vitals:    09/17/19 0100 09/17/19 0115 09/17/19 0140 09/17/19 0538  BP: 102/68 112/68 (!) 87/59 107/67  Pulse: 80 79 72 68  Resp: (!) 23 (!) 24 18 16   Temp:   98 F (36.7 C) 97.6 F (36.4 C)  TempSrc:      SpO2: 100% 99% 100% 100%    Intake/Output Summary (Last 24 hours) at 09/17/2019 0728 Last data filed at 09/17/2019 [id number] Gross per 24 hour  Intake 1599.27 ml  Output 2 ml  Net 1597.27 ml   There were no vitals filed for this visit.  Examination:  General exam: thin ,malnourished HEENT:PERRL,Oral mucosa moist, Ear/Nose normal on gross exam Respiratory system: Bilateral equal air entry, normal vesicular breath sounds, no wheezes or crackles  Cardiovascular system: S1 & S2 heard, RRR. No JVD, murmurs, rubs, gallops or clicks. No pedal edema.  Chemo-Port on the right chest Gastrointestinal system: Abdomen is nondistended, soft and nontender. No organomegaly or masses felt. Normal bowel sounds heard. Central nervous system: Alert and oriented. No focal neurological deficits. Extremities: No edema, no clubbing ,no cyanosis, distal peripheral pulses palpable. Skin: No rashes, lesions or ulcers,no icterus ,no pallor    Data Reviewed: I have personally reviewed following labs and imaging studies  CBC: Recent Labs  Lab 09/12/19 1443 09/16/19 2101 09/17/19 0600  WBC 7.3 1.4* 2.6*  NEUTROABS 5.4 0.5* 0.6*  HGB 11.5* 10.1* 9.3*  HCT 34.0* 29.7* 28.4*  MCV 81.1 81.1 83.0  PLT 346 302 [id number]   Basic Metabolic Panel: Recent Labs  Lab 09/12/19 1443 09/16/19 2101  NA 131* 132*  K 4.4 3.6  CL 96* 100  CO2 23 23  GLUCOSE 143* 139*  BUN 11 5*  CREATININE 0.66 0.56*  CALCIUM 8.8* 8.3*   GFR: Estimated Creatinine Clearance: 61.4 mL/min (A) (by C-G formula based on SCr of 0.56 mg/dL (L)). Liver Function Tests: Recent Labs  Lab 09/12/19 1443 09/16/19 2101  AST 36 45*  ALT 11 19  ALKPHOS 142* 112  BILITOT 0.7 0.6  PROT 6.9 6.2*  ALBUMIN 2.8* 2.8*   No results for input(s): LIPASE,  AMYLASE in the last 168 hours. No results for input(s): AMMONIA in the last 168 hours. Coagulation Profile: Recent Labs  Lab 09/16/19 2101  INR 1.3*   Cardiac Enzymes: No results for input(s): CKTOTAL, CKMB, CKMBINDEX, TROPONINI in the last 168 hours. BNP (last 3 results) No results for input(s): PROBNP in the last 8760 hours. HbA1C: No results for input(s): HGBA1C in the last 72 hours. CBG: Recent Labs  Lab 09/17/19 0144 09/17/19 0410  GLUCAP 125* 77   Lipid Profile: No results for input(s): CHOL, HDL, LDLCALC, TRIG, CHOLHDL, LDLDIRECT in the last 72 hours. Thyroid Function Tests: No results for input(s): TSH, T4TOTAL, FREET4, T3FREE, THYROIDAB in the last 72 hours. Anemia Panel: Recent Labs    09/17/19 0600  FOLATE 22.1  FERRITIN 350*  TIBC 144*  IRON 10*  RETICCTPCT 1.7   Sepsis Labs: Recent Labs  Lab  09/16/19 2101  LATICACIDVEN 1.1    Recent Results (from the past 240 hour(s))  Respiratory Panel by RT PCR (Flu A&B, Covid) - Nasopharyngeal Swab     Status: None   Collection Time: 09/16/19  9:27 PM   Specimen: Nasopharyngeal Swab  Result Value Ref Range Status   SARS Coronavirus 2 by RT PCR NEGATIVE NEGATIVE Final    Comment: (NOTE) SARS-CoV-2 target nucleic acids are NOT DETECTED. The SARS-CoV-2 RNA is generally detectable in upper respiratoy specimens during the acute phase of infection. The lowest concentration of SARS-CoV-2 viral copies this assay can detect is 131 copies/mL. A negative result does not preclude SARS-Cov-2 infection and should not be used as the sole basis for treatment or other patient management decisions. A negative result may occur with  improper specimen collection/handling, submission of specimen other than nasopharyngeal swab, presence of viral mutation(s) within the areas targeted by this assay, and inadequate number of viral copies (<131 copies/mL). A negative result must be combined with clinical observations, patient  history, and epidemiological information. The expected result is Negative. Fact Sheet for Patients:  PinkCheek.be Fact Sheet for Healthcare Providers:  GravelBags.it This test is not yet ap proved or cleared by the [place] FDA and  has been authorized for detection and/or diagnosis of SARS-CoV-2 by FDA under an Emergency Use Authorization (EUA). This EUA will remain  in effect (meaning this test can be used) for the duration of the COVID-19 declaration under Section 564(b)(1) of the Act, 21 U.S.C. section 360bbb-3(b)(1), unless the authorization is terminated or revoked sooner.    Influenza A by PCR NEGATIVE NEGATIVE Final   Influenza B by PCR NEGATIVE NEGATIVE Final    Comment: (NOTE) The Xpert Xpress SARS-CoV-2/FLU/RSV assay is intended as an aid in  the diagnosis of influenza from Nasopharyngeal swab specimens and  should not be used as a sole basis for treatment. Nasal washings and  aspirates are unacceptable for Xpert Xpress SARS-CoV-2/FLU/RSV  testing. Fact Sheet for Patients: PinkCheek.be Fact Sheet for Healthcare Providers: GravelBags.it This test is not yet approved or cleared by the [place] FDA and  has been authorized for detection and/or diagnosis of SARS-CoV-2 by  FDA under an Emergency Use Authorization (EUA). This EUA will remain  in effect (meaning this test can be used) for the duration of the  Covid-19 declaration under Section 564(b)(1) of the Act, 21  U.S.C. section 360bbb-3(b)(1), unless the authorization is  terminated or revoked. Performed at [facility], [place] [address], [place], [place] [address]          Radiology Studies: CT Chest Wo Contrast  Result Date: 09/16/2019 CLINICAL DATA:  Persistent artifact versus pneumothorax on plain film with cough and fever EXAM: CT CHEST WITHOUT CONTRAST TECHNIQUE:  Multidetector CT imaging of the chest was performed following the standard protocol without IV contrast. COMPARISON:  CT abdomen pelvis 08/25/2019, radiograph 09/16/2019. FINDINGS: Cardiovascular: Normal heart size. No pericardial effusion. Accessed right IJ Port-A-Cath tip positioned at the right atrium. Few calcifications at the aortic leaflets. Coronary calcifications are present as well. Atherosclerotic plaque within the normal caliber aorta. Normal 3 vessel branching of the aortic arch. Central pulmonary arteries are normal caliber. Mediastinum/Nodes: No mediastinal fluid or gas. Normal thyroid gland and thoracic inlet. No acute abnormality of the trachea or esophagus. Few calcified left hilar nodes, likely sequela of granulomatous disease. No worrisome mediastinal or axillary adenopathy. Hilar nodal evaluation is limited in the absence of intravenous contrast media. Lungs/Pleura: Few scattered  solid calcified nodules throughout the lungs include: 4 mm subpleural nodule in the left lung apex (7/31, solid irregular 4 mm calcified nodule more medially left apex (7/30), calcified 7 mm ureter subpleural node in the lingula (7/108), 3 mm subpleural nodule along the right heart border in the right middle lobe (7/108). Some bandlike areas of scarring present in the lung apices. Mild airways thickening more pronounced towards the lower lungs. Dependent atelectasis posteriorly with more dense opacity in the periphery of the right lung base which could reflect further subsegmental atelectasis though some underlying consolidation or aspiration is a possibility as well. Upper Abdomen: Multiple hypoattenuating lesions seen throughout the liver. Redemonstration of the large air and fluid containing mass arising from the pancreatic body measuring up to the 11.4 x 8.8 cm on this exam and involving the lesser curvature of the stomach and gastric fundus. Musculoskeletal: Multilevel degenerative changes are present in the imaged  portions of the spine. No acute osseous abnormality or suspicious osseous lesion. No worrisome chest wall lesions. Paucity of subcutaneous fat. Right chest wall port catheter. IMPRESSION: 1. No pneumothorax. Previously seen artifact likely reflected a skin fold. 2. Dependent atelectasis posteriorly with more dense opacity in the periphery of the right lung base which could reflect further subsegmental atelectasis though some underlying consolidation or aspiration is a possibility particularly given the involvement of the gastric fundus and lesser curvature by the large air and fluid containing pancreatic mass better detailed on comparison CT abdomen pelvis. 3. Multiple hypoattenuating lesions throughout the liver, compatible with metastatic disease. 4. Calcified left hilar nodes with several scattered sub 5 mm calcified and noncalcified nodules throughout the lungs. May reflect sequela of granulomatous disease though continued attention on follow-up imaging is recommended in the setting of known malignancy. 5. Accessed right IJ Port-A-Cath tip positioned at the right atrium. 6. Aortic Atherosclerosis (ICD10-I70.0). Electronically Signed   By: Lovena Le M.D.   On: 09/16/2019 23:04   DG Chest Port 1 View  Result Date: 09/16/2019 CLINICAL DATA:  74 year old male with fever. EXAM: PORTABLE CHEST 1 VIEW COMPARISON:  None. FINDINGS: The lungs are clear. There is no pleural effusion. Linear lucency along the peripheral right lung, likely artifactual and related to skin fold. A pneumothorax is less likely. A 4 mm left lung base calcified granuloma. The cardiac silhouette is within limits. Left hilar calcified gland normal noted. Right-sided Port-A-Cath with tip at the cavoatrial junction. No acute osseous pathology. IMPRESSION: 1. No focal consolidation. 2. Artifact versus less likely a right-sided pneumothorax. Electronically Signed   By: Anner Crete M.D.   On: 09/16/2019 21:27        Scheduled Meds: .  Chlorhexidine Gluconate Cloth  6 each Topical Daily  . enoxaparin (LOVENOX) injection  40 mg Subcutaneous QHS  . insulin aspart  0-9 Units Subcutaneous Q4H  . polyethylene glycol  17 g Oral Daily  . sodium chloride flush  10-40 mL Intracatheter Q12H   Continuous Infusions: . sodium chloride 125 mL/hr at 09/17/19 0616  . piperacillin-tazobactam (ZOSYN)  IV 12.5 mL/hr at 09/17/19 0616  . vancomycin       LOS: 0 days    Time spent: 35 mins.More than 50% of that time was spent in counseling and/or coordination of care.      Shelly Coss, MD [facility] P5/09/2019, 7:28 AM

## 2019-09-17 NOTE — Progress Notes (Signed)
Initial Nutrition Assessment  DOCUMENTATION CODES:   Underweight  INTERVENTION:  Ensure Enlive po BID, each supplement provides 350 kcal and 20 grams of protein   NUTRITION DIAGNOSIS:   Increased nutrient needs related to cancer and cancer related treatments as evidenced by estimated needs.   GOAL:   Patient will meet greater than or equal to 90% of their needs    MONITOR:   Labs, I & O's, Supplement acceptance, Weight trends, PO intake  REASON FOR ASSESSMENT:   Malnutrition Screening Tool    ASSESSMENT:  RD working remotely.   74 year old male with past medical history of metastatic gastric cancer s/p cycle 1 FOLFOX, IDDM admitted for aspiration pneumonia after presenting with complaints of generalized weakness, fever, and cough.  4/22 - last chemo  Patient diet advanced from clear liquids to regular at 1455 today s/p SLP evaluation. Per flowsheets he consumed 100% of dinner meal. Will continue to monitor po intake and provide Ensure supplement to aid with meeting needs.  Current wt 114.4 lbs Limited wt history for review, on 09/04/19 pt weighed 120.78 lbs, on 4/16 pt weighed 122.1 lbs. This indicates an 7.7 lb (6.3%) wt loss in 3.5 weeks which is significant. Patient is underweight, and given history of metastatic gastric cancer, highly suspect malnutrition however unable to identify at this time.   Per notes: -Hypotension improved with IV fluids -febrile neutropenia, WBC improving, continue antibiotics -blood cultures pending -FOBT positive with gastric cancer, no GI consult at this time Medications reviewed and include: SSI Miralax Labs: CBGs [phone]  NUTRITION - FOCUSED PHYSICAL EXAM: Unable to complete at this time, RD working remotely.  Diet Order:   Diet Order            Diet regular Room service appropriate? Yes; Fluid consistency: Thin  Diet effective now              EDUCATION NEEDS:   No education needs have been identified at this  time  Skin:  Skin Assessment: Reviewed RN Assessment  Last BM:  5/5  Height:   Ht Readings from Last 1 Encounters:  09/12/19 5' 8.5" (1.74 m)    Weight:   Wt Readings from Last 1 Encounters:  09/12/19 52 kg    BMI:  There is no height or weight on file to calculate BMI.  Estimated Nutritional Needs:   Kcal:  1900-2100  Protein:  95-105  Fluid:  >/= 1.9 L/day   Lajuan Lines, RD, LDN Clinical Nutrition After Hours/Weekend Pager # in [place]

## 2019-09-17 NOTE — Progress Notes (Signed)
Pharmacy Antibiotic Note  Alexander Lawson is a 74 y.o. male admitted on 09/16/2019 with Febrile Neutropenia.  Pharmacy has been consulted for Vancomycin and Zosyn dosing.  Plan: Vancomycin 1gm iv x1, then Vancomycin 500 mg IV Q 12 hrs. Goal AUC 400-550. Expected AUC: 482 SCr used: 0.8 (adjusted)  Zosyn 3.375gm iv q8hr  Cefepime 2gm iv x1      Temp (24hrs), Avg:100.3 F (37.9 C), Min:100.3 F (37.9 C), Max:100.3 F (37.9 C)  Recent Labs  Lab 09/12/19 1443 09/16/19 2101  WBC 7.3 1.4*  CREATININE 0.66 0.56*  LATICACIDVEN  --  1.1    Estimated Creatinine Clearance: 61.4 mL/min (A) (by C-G formula based on SCr of 0.56 mg/dL (L)).    No Known Allergies  Antimicrobials this admission: Vancomycin 09/17/2019 >> Zosyn 09/17/2019 >>   Dose adjustments this admission: -  Microbiology results: -  Thank you for allowing pharmacy to be a part of this patient's care.  Nani Skillern [place]ford 09/17/2019 12:57 AM

## 2019-09-18 ENCOUNTER — Inpatient Hospital Stay: Payer: Self-pay

## 2019-09-18 ENCOUNTER — Inpatient Hospital Stay: Payer: Self-pay | Admitting: Oncology

## 2019-09-18 DIAGNOSIS — C160 Malignant neoplasm of cardia: Secondary | ICD-10-CM

## 2019-09-18 LAB — BASIC METABOLIC PANEL
Anion gap: 9 (ref 5–15)
BUN: <5 mg/dL — ABNORMAL LOW (ref 8–23)
CO2: 23 mmol/L (ref 22–32)
Calcium: 8.2 mg/dL — ABNORMAL LOW (ref 8.9–10.3)
Chloride: 99 mmol/L (ref 98–111)
Creatinine, Ser: 0.54 mg/dL — ABNORMAL LOW (ref 0.61–1.24)
GFR calc Af Amer: >60 mL/min (ref >60)
GFR calc non Af Amer: >60 mL/min (ref >60)
Glucose, Bld: 88 mg/dL (ref 70–99)
Potassium: 3.4 mmol/L — ABNORMAL LOW (ref 3.5–5.1)
Sodium: 131 mmol/L — ABNORMAL LOW (ref 135–145)

## 2019-09-18 LAB — GLUCOSE, CAPILLARY
Glucose-Capillary: 114 mg/dL — ABNORMAL HIGH (ref 70–99)
Glucose-Capillary: 117 mg/dL — ABNORMAL HIGH (ref 70–99)
Glucose-Capillary: 133 mg/dL — ABNORMAL HIGH (ref 70–99)
Glucose-Capillary: 168 mg/dL — ABNORMAL HIGH (ref 70–99)
Glucose-Capillary: 61 mg/dL — ABNORMAL LOW (ref 70–99)
Glucose-Capillary: 83 mg/dL (ref 70–99)
Glucose-Capillary: 92 mg/dL (ref 70–99)

## 2019-09-18 LAB — CBC WITH DIFFERENTIAL/PLATELET
Abs Immature Granulocytes: 0.01 10*3/uL (ref 0.00–0.07)
Basophils Absolute: 0 10*3/uL (ref 0.0–0.1)
Basophils Relative: 0 %
Eosinophils Absolute: 0.2 10*3/uL (ref 0.0–0.5)
Eosinophils Relative: 8 %
HCT: 29.7 % — ABNORMAL LOW (ref 39.0–52.0)
Hemoglobin: 9.7 g/dL — ABNORMAL LOW (ref 13.0–17.0)
Immature Granulocytes: 0 %
Lymphocytes Relative: 55 %
Lymphs Abs: 1.4 10*3/uL (ref 0.7–4.0)
MCH: 26.6 pg (ref 26.0–34.0)
MCHC: 32.7 g/dL (ref 30.0–36.0)
MCV: 81.6 fL (ref 80.0–100.0)
Monocytes Absolute: 0.5 10*3/uL (ref 0.1–1.0)
Monocytes Relative: 19 %
Neutro Abs: 0.5 10*3/uL — ABNORMAL LOW (ref 1.7–7.7)
Neutrophils Relative %: 18 %
Platelets: 335 10*3/uL (ref 150–400)
RBC: 3.64 MIL/uL — ABNORMAL LOW (ref 4.22–5.81)
RDW: 14 % (ref 11.5–15.5)
WBC: 2.6 10*3/uL — ABNORMAL LOW (ref 4.0–10.5)
nRBC: 0 % (ref 0.0–0.2)

## 2019-09-18 LAB — SURGICAL PATHOLOGY

## 2019-09-18 MED ORDER — SODIUM CHLORIDE 0.9 % IV SOLN: INTRAVENOUS | Status: DC | PRN

## 2019-09-18 MED ORDER — LORAZEPAM 0.5 MG PO TABS: 0.5000 mg | ORAL_TABLET | Freq: Every evening | ORAL | Status: DC | PRN

## 2019-09-18 MED ORDER — INSULIN ASPART 100 UNIT/ML ~~LOC~~ SOLN: 0.0000 [IU] | Freq: Three times a day (TID) | SUBCUTANEOUS | Status: DC

## 2019-09-18 MED ORDER — INSULIN ASPART 100 UNIT/ML ~~LOC~~ SOLN: 0.0000 [IU] | Freq: Every day | SUBCUTANEOUS | Status: DC

## 2019-09-18 MED ORDER — POTASSIUM CHLORIDE CRYS ER 20 MEQ PO TBCR
40.0000 meq | EXTENDED_RELEASE_TABLET | Freq: Once | ORAL | Status: AC
  Administered 2019-09-18: 40 meq via ORAL
  Filled 2019-09-18: qty 2

## 2019-09-18 NOTE — Progress Notes (Signed)
PROGRESS NOTE    Jachai Kenner  [id number] DOB: 1945/12/31 DOA: 09/16/2019 PCP: Patient, No Pcp Per   Brief Narrative: Patient is a 74 year old male with with history of metastatic gastric cancer, currently on chemotherapy, insulin-dependent diabetes who presents to the emergency department with complaints of generalized weakness, fever, cough.  Patient speaks Spanish.  Patient also reported fevers and chills at home.  On presentation he was hypotensive, tachycardic.  Lab work showed leukopenia, neutropenia.  Chest x-ray did not show any focal consolidation but chest CT showed dense opacity in the periphery of the right lung base resting consolidation versus aspiration.  Started on broad-spectrum antibiotics.  Cultures pending. Plan for discharge tomorrow to home if cultures remain negative.  Assessment & Plan:   Principal Problem:   Aspiration pneumonia ([place]) Active Problems:   Gastric cancer ([place])   Sepsis ([place])   Neutropenia with fever ([place])   Anemia associated with chemotherapy   Suspected sepsis/aspiration pneumonia: Hypotensive on presentation.  Improved with IV fluids.  He was also febrile on presentation but not hypoxic.  Lactate is normal.  Chest x-ray as above.  Covid negative. Continue current antibiotics, IV fluids, blood cultures are pending,NGTD.  Speech therapy evaluation done, no restriction.  Febrile neutropenia: Continue antibiotics as above.  Follow blood cultures.  White cell counts have been stable and improved since admission.  Continue to monitor CBC.  Afebrile today.  No indication for Granix now.  Normocytic anemia: Most likely associated  with chemotherapy.  Severely deficient in iron.  Will order a dose  IV iron tomorrow if cultures remain negative.  FOBT is positive but he has gastric malignancy so will not pursue for GI consultation  Metastatic gastric cancer: Follows with oncology.  Status post cycle 1 FOLFOX on 09/04/19.  Pulmonary nodules: CT showing  calcified left hilar nodules with severe scattered subcentimeter 5 mm calcified and noncalcified nodules throughout the lungs.  Follow-up imaging as an outpatient.  Insulin-dependent diabetes mellitus: Currently on sliding scale insulin. Hemoglobin A1c is 7.7  Constipation: Continue MiraLAX  Severe protein calorie malnutrition: BMI of 17.  Albumin of 2.8.  Nutrition team following..     Nutrition Problem: Increased nutrient needs Etiology: cancer and cancer related treatments      DVT prophylaxis:Lovenox Code Status: Full Family Communication: Discussed with daughter on phone on 09/17/2019 Status is: Inpatient  Remains inpatient appropriate because:IV treatments appropriate due to intensity of illness or inability to take PO   Dispo: The patient is from: Home              Anticipated d/c is to: Home              Anticipated d/c date is: 1              Patient currently is not medically stable to d/c.    Consultants: None  Procedures:None  Antimicrobials:  Anti-infectives (From admission, onward)   Start     Dose/Rate Route Frequency Ordered Stop   09/17/19 1000  vancomycin (VANCOREADY) IVPB 500 mg/100 mL     500 mg 100 mL/hr over 60 Minutes Intravenous Every 12 hours 09/17/19 0057     09/17/19 0600  piperacillin-tazobactam (ZOSYN) IVPB 3.375 g     3.375 g 12.5 mL/hr over 240 Minutes Intravenous Every 8 hours 09/17/19 0055     09/16/19 2330  metroNIDAZOLE (FLAGYL) IVPB 500 mg     500 mg 100 mL/hr over 60 Minutes Intravenous  Once 09/16/19 2329 09/17/19 0052  09/16/19 2115  ceFEPIme (MAXIPIME) 2 g in sodium chloride 0.9 % 100 mL IVPB     2 g 200 mL/hr over 30 Minutes Intravenous  Once 09/16/19 2101 09/16/19 2232   09/16/19 2115  vancomycin (VANCOCIN) IVPB 1000 mg/200 mL premix     1,000 mg 200 mL/hr over 60 Minutes Intravenous  Once 09/16/19 2101 09/16/19 2329      Subjective: Patient seen and examined at the bedside this morning.  Hemodynamically stable.  Blood  pressure much stable now.  Afebrile.  Looks comfortable and denies any complaints.  Objective: Vitals:   09/17/19 0850 09/17/19 1435 09/17/19 2031 09/18/19 0434  BP: 123/70 123/66 125/69 119/66  Pulse: 80 88 73 74  Resp: 13 16 15 16   Temp: 98.6 F (37 C) (!) 97.5 F (36.4 C) 98.8 F (37.1 C) 98.5 F (36.9 C)  TempSrc: Oral Oral Oral Oral  SpO2: 100% 100% 100% 100%    Intake/Output Summary (Last 24 hours) at 09/18/2019 0745 Last data filed at 09/18/2019 0600 Gross per 24 hour  Intake 704.95 ml  Output 450 ml  Net 254.95 ml   There were no vitals filed for this visit.  Examination:   General exam: thin, malnourished  Respiratory system: Bilateral equal air entry, normal vesicular breath sounds, no wheezes or crackles  Cardiovascular system: S1 & S2 heard, RRR. No JVD, murmurs, rubs, gallops or clicks.  Chemo-Port on the right chest Gastrointestinal system: Abdomen is nondistended, soft and nontender. No organomegaly or masses felt. Normal bowel sounds heard. Central nervous system: Alert and oriented. No focal neurological deficits. Extremities: No edema, no clubbing ,no cyanosis, distal peripheral pulses palpable. Skin: No rashes, lesions or ulcers,no icterus ,no pallor   Data Reviewed: I have personally reviewed following labs and imaging studies  CBC: Recent Labs  Lab 09/12/19 1443 09/16/19 2101 09/17/19 0600 09/18/19 0500  WBC 7.3 1.4* 2.6* 2.6*  NEUTROABS 5.4 0.5* 0.6* 0.5*  HGB 11.5* 10.1* 9.3* 9.7*  HCT 34.0* 29.7* 28.4* 29.7*  MCV 81.1 81.1 83.0 81.6  PLT 346 302 262 [id number]   Basic Metabolic Panel: Recent Labs  Lab 09/12/19 1443 09/16/19 2101 09/18/19 0500  NA 131* 132* 131*  K 4.4 3.6 3.4*  CL 96* 100 99  CO2 23 23 23   GLUCOSE 143* 139* 88  BUN 11 5* <5*  CREATININE 0.66 0.56* 0.54*  CALCIUM 8.8* 8.3* 8.2*   GFR: Estimated Creatinine Clearance: 61.4 mL/min (A) (by C-G formula based on SCr of 0.54 mg/dL (L)). Liver Function Tests: Recent Labs    Lab 09/12/19 1443 09/16/19 2101  AST 36 45*  ALT 11 19  ALKPHOS 142* 112  BILITOT 0.7 0.6  PROT 6.9 6.2*  ALBUMIN 2.8* 2.8*   No results for input(s): LIPASE, AMYLASE in the last 168 hours. No results for input(s): AMMONIA in the last 168 hours. Coagulation Profile: Recent Labs  Lab 09/16/19 2101  INR 1.3*   Cardiac Enzymes: No results for input(s): CKTOTAL, CKMB, CKMBINDEX, TROPONINI in the last 168 hours. BNP (last 3 results) No results for input(s): PROBNP in the last 8760 hours. HbA1C: Recent Labs    09/17/19 0600  HGBA1C 7.7*   CBG: Recent Labs  Lab 09/17/19 1610 09/17/19 2034 09/18/19 0014 09/18/19 0437 09/18/19 0524  GLUCAP 165* 73 83 61* 92   Lipid Profile: No results for input(s): CHOL, HDL, LDLCALC, TRIG, CHOLHDL, LDLDIRECT in the last 72 hours. Thyroid Function Tests: No results for input(s): TSH, T4TOTAL, FREET4, T3FREE, THYROIDAB in the  last 72 hours. Anemia Panel: Recent Labs    09/17/19 0600  VITAMINB12 3,770*  FOLATE 22.1  FERRITIN 350*  TIBC 144*  IRON 10*  RETICCTPCT 1.7   Sepsis Labs: Recent Labs  Lab 09/16/19 2101  LATICACIDVEN 1.1    Recent Results (from the past 240 hour(s))  Blood Culture (routine x 2)     Status: None (Preliminary result)   Collection Time: 09/16/19  9:01 PM   Specimen: BLOOD  Result Value Ref Range Status   Specimen Description   Final    BLOOD RIGHT ANTECUBITAL Performed at [facility], [place] [address] [place], [place] [address]    Special Requests   Final    BOTTLES DRAWN AEROBIC AND ANAEROBIC Blood Culture results may not be optimal due to an excessive volume of blood received in culture bottles Performed at [place] [address] [place], [place] [address]    Culture   Final    NO GROWTH 1 DAY Performed at [facility], [place] [address] [place], [place] [address]    Report Status PENDING  Incomplete  Blood Culture (routine x 2)     Status:  None (Preliminary result)   Collection Time: 09/16/19  9:27 PM   Specimen: BLOOD  Result Value Ref Range Status   Specimen Description   Final    BLOOD PORTA CATH Performed at [place] [address], [place], [place] [address]    Special Requests   Final    BOTTLES DRAWN AEROBIC AND ANAEROBIC Blood Culture adequate volume Performed at [place] [address], [place], [place] [address]    Culture   Final    NO GROWTH 1 DAY Performed at [facility], [place] [address] [place], [place] [address]    Report Status PENDING  Incomplete  Respiratory Panel by RT PCR (Flu A&B, Covid) - Nasopharyngeal Swab     Status: None   Collection Time: 09/16/19  9:27 PM   Specimen: Nasopharyngeal Swab  Result Value Ref Range Status   SARS Coronavirus 2 by RT PCR NEGATIVE NEGATIVE Final    Comment: (NOTE) SARS-CoV-2 target nucleic acids are NOT DETECTED. The SARS-CoV-2 RNA is generally detectable in upper respiratoy specimens during the acute phase of infection. The lowest concentration of SARS-CoV-2 viral copies this assay can detect is 131 copies/mL. A negative result does not preclude SARS-Cov-2 infection and should not be used as the sole basis for treatment or other patient management decisions. A negative result may occur with  improper specimen collection/handling, submission of specimen other than nasopharyngeal swab, presence of viral mutation(s) within the areas targeted by this assay, and inadequate number of viral copies (<131 copies/mL). A negative result must be combined with clinical observations, patient history, and epidemiological information. The expected result is Negative. Fact Sheet for Patients:  PinkCheek.be Fact Sheet for Healthcare Providers:  GravelBags.it This test is not yet ap proved or cleared by the [place] FDA and  has been authorized for detection  and/or diagnosis of SARS-CoV-2 by FDA under an Emergency Use Authorization (EUA). This EUA will remain  in effect (meaning this test can be used) for the duration of the COVID-19 declaration under Section 564(b)(1) of the Act, 21 U.S.C. section 360bbb-3(b)(1), unless the authorization is terminated or revoked sooner.    Influenza A by PCR NEGATIVE NEGATIVE Final   Influenza B by PCR NEGATIVE NEGATIVE Final    Comment: (NOTE) The Xpert  Xpress SARS-CoV-2/FLU/RSV assay is intended as an aid in  the diagnosis of influenza from Nasopharyngeal swab specimens and  should not be used as a sole basis for treatment. Nasal washings and  aspirates are unacceptable for Xpert Xpress SARS-CoV-2/FLU/RSV  testing. Fact Sheet for Patients: PinkCheek.be Fact Sheet for Healthcare Providers: GravelBags.it This test is not yet approved or cleared by the [place] FDA and  has been authorized for detection and/or diagnosis of SARS-CoV-2 by  FDA under an Emergency Use Authorization (EUA). This EUA will remain  in effect (meaning this test can be used) for the duration of the  Covid-19 declaration under Section 564(b)(1) of the Act, 21  U.S.C. section 360bbb-3(b)(1), unless the authorization is  terminated or revoked. Performed at [facility], [place] [address], [place], [place] [address]   Urine culture     Status: None   Collection Time: 09/16/19 11:53 PM   Specimen: In/Out Cath Urine  Result Value Ref Range Status   Specimen Description   Final    IN/OUT CATH URINE Performed at [place] [address] [place], [place] [address]    Special Requests   Final    NONE Performed at [facility], [place] [address], [place], [place] [address]    Culture   Final    NO GROWTH Performed at [facility], [place] [address], [place], [place] [address]    Report Status 09/17/2019 FINAL   Final         Radiology Studies: CT Chest Wo Contrast  Result Date: 09/16/2019 CLINICAL DATA:  Persistent artifact versus pneumothorax on plain film with cough and fever EXAM: CT CHEST WITHOUT CONTRAST TECHNIQUE: Multidetector CT imaging of the chest was performed following the standard protocol without IV contrast. COMPARISON:  CT abdomen pelvis 08/25/2019, radiograph 09/16/2019. FINDINGS: Cardiovascular: Normal heart size. No pericardial effusion. Accessed right IJ Port-A-Cath tip positioned at the right atrium. Few calcifications at the aortic leaflets. Coronary calcifications are present as well. Atherosclerotic plaque within the normal caliber aorta. Normal 3 vessel branching of the aortic arch. Central pulmonary arteries are normal caliber. Mediastinum/Nodes: No mediastinal fluid or gas. Normal thyroid gland and thoracic inlet. No acute abnormality of the trachea or esophagus. Few calcified left hilar nodes, likely sequela of granulomatous disease. No worrisome mediastinal or axillary adenopathy. Hilar nodal evaluation is limited in the absence of intravenous contrast media. Lungs/Pleura: Few scattered solid calcified nodules throughout the lungs include: 4 mm subpleural nodule in the left lung apex (7/31, solid irregular 4 mm calcified nodule more medially left apex (7/30), calcified 7 mm ureter subpleural node in the lingula (7/108), 3 mm subpleural nodule along the right heart border in the right middle lobe (7/108). Some bandlike areas of scarring present in the lung apices. Mild airways thickening more pronounced towards the lower lungs. Dependent atelectasis posteriorly with more dense opacity in the periphery of the right lung base which could reflect further subsegmental atelectasis though some underlying consolidation or aspiration is a possibility as well. Upper Abdomen: Multiple hypoattenuating lesions seen throughout the liver. Redemonstration of the large air and fluid containing mass  arising from the pancreatic body measuring up to the 11.4 x 8.8 cm on this exam and involving the lesser curvature of the stomach and gastric fundus. Musculoskeletal: Multilevel degenerative changes are present in the imaged portions of the spine. No acute osseous abnormality or suspicious osseous lesion. No worrisome chest wall lesions. Paucity of subcutaneous fat. Right chest  wall port catheter. IMPRESSION: 1. No pneumothorax. Previously seen artifact likely reflected a skin fold. 2. Dependent atelectasis posteriorly with more dense opacity in the periphery of the right lung base which could reflect further subsegmental atelectasis though some underlying consolidation or aspiration is a possibility particularly given the involvement of the gastric fundus and lesser curvature by the large air and fluid containing pancreatic mass better detailed on comparison CT abdomen pelvis. 3. Multiple hypoattenuating lesions throughout the liver, compatible with metastatic disease. 4. Calcified left hilar nodes with several scattered sub 5 mm calcified and noncalcified nodules throughout the lungs. May reflect sequela of granulomatous disease though continued attention on follow-up imaging is recommended in the setting of known malignancy. 5. Accessed right IJ Port-A-Cath tip positioned at the right atrium. 6. Aortic Atherosclerosis (ICD10-I70.0). Electronically Signed   By: Lovena Le M.D.   On: 09/16/2019 23:04   DG Chest Port 1 View  Result Date: 09/16/2019 CLINICAL DATA:  73 year old male with fever. EXAM: PORTABLE CHEST 1 VIEW COMPARISON:  None. FINDINGS: The lungs are clear. There is no pleural effusion. Linear lucency along the peripheral right lung, likely artifactual and related to skin fold. A pneumothorax is less likely. A 4 mm left lung base calcified granuloma. The cardiac silhouette is within limits. Left hilar calcified gland normal noted. Right-sided Port-A-Cath with tip at the cavoatrial junction. No acute  osseous pathology. IMPRESSION: 1. No focal consolidation. 2. Artifact versus less likely a right-sided pneumothorax. Electronically Signed   By: Anner Crete M.D.   On: 09/16/2019 21:27        Scheduled Meds: . Chlorhexidine Gluconate Cloth  6 each Topical Daily  . enoxaparin (LOVENOX) injection  40 mg Subcutaneous QHS  . feeding supplement (ENSURE ENLIVE)  237 mL Oral BID BM  . insulin aspart  0-9 Units Subcutaneous Q4H  . polyethylene glycol  17 g Oral Daily  . potassium chloride  40 mEq Oral Once  . sodium chloride flush  10-40 mL Intracatheter Q12H   Continuous Infusions: . piperacillin-tazobactam (ZOSYN)  IV 3.375 g (09/18/19 0543)  . vancomycin 500 mg (09/17/19 2131)     LOS: 1 day    Time spent: 35 mins.More than 50% of that time was spent in counseling and/or coordination of care.      Shelly Coss, MD [facility] P5/10/2019, 7:45 AM

## 2019-09-18 NOTE — Progress Notes (Addendum)
HEMATOLOGY-ONCOLOGY PROGRESS NOTE  SUBJECTIVE: Mr. Jun presented to the emergency room with weakness and fever.  He was having an increased cough which cause chest discomfort.  He was also having fevers and chills.  In the ER, his temperature was 100.3.  CBC on admission showed a WBC of 1.4 and ANC of 0.5  His heme, hemoglobin 10.1.  CT of the chest showed dependent atelectasis, multiple hypoattenuating lesions throughout the liver, no pneumothorax.  He has been started on IV antibiotics.  Currently afebrile.  The patient was seen with the assistance of a video interpreter.  He reports that he is feeling better.  He is not having any recurrent fevers or chills.  Appetite is good.  Denies abdominal pain, nausea, vomiting.  Reports that he has had some difficulty sleeping.  He also had some neuropathy in his fingertips and feet last evening which has now completely resolved.  Oncology History  Gastric cancer ([place])  08/29/2019 Initial Diagnosis   Gastric cancer ([place])   09/04/2019 -  Chemotherapy   The patient had palonosetron (ALOXI) injection 0.25 mg, 0.25 mg, Intravenous,  Once, 1 of 6 cycles Administration: 0.25 mg (09/04/2019) leucovorin 652 mg in dextrose 5 % 250 mL infusion, 400 mg/m2 = 652 mg, Intravenous,  Once, 1 of 6 cycles Administration: 652 mg (09/04/2019) oxaliplatin (ELOXATIN) 140 mg in dextrose 5 % 500 mL chemo infusion, 85 mg/m2 = 140 mg, Intravenous,  Once, 1 of 6 cycles Administration: 140 mg (09/04/2019) fluorouracil (ADRUCIL) chemo injection 650 mg, 400 mg/m2 = 650 mg, Intravenous,  Once, 1 of 1 cycle Administration: 650 mg (09/04/2019) fluorouracil (ADRUCIL) 3,900 mg in sodium chloride 0.9 % 72 mL chemo infusion, 2,400 mg/m2 = 3,900 mg, Intravenous, 1 Day/Dose, 1 of 6 cycles Dose modification: 1,680 mg/m2 (original dose 2,400 mg/m2, Cycle 2, Reason: Provider Judgment) Administration: 3,900 mg (09/04/2019)  for chemotherapy treatment.    09/18/2019 -  Chemotherapy   The  patient had nivolumab (OPDIVO) 240 mg in sodium chloride 0.9 % 100 mL chemo infusion, 240 mg, Intravenous, Once, 0 of 6 cycles  for chemotherapy treatment.     PHYSICAL EXAMINATION:  Vitals:   09/17/19 2031 09/18/19 0434  BP: 125/69 119/66  Pulse: 73 74  Resp: 15 16  Temp: 98.8 F (37.1 C) 98.5 F (36.9 C)  SpO2: 100% 100%   There were no vitals filed for this visit.  Intake/Output from previous day: 05/05 0701 - 05/06 0700 In: 705 [P.O.:355; IV Piggyback:350] Out: 450 [Urine:450]  GENERAL:alert, no distress and comfortable OROPHARYNX: No thrush or mucositis LUNGS: clear to auscultation and percussion with normal breathing effort HEART: regular rate & rhythm and no murmurs and no lower extremity edema ABDOMEN:abdomen soft, non-tender and normal bowel sounds  NEURO: alert & oriented x 3 with fluent speech, no focal motor/sensory deficits  Port-A-Cath without erythema  LABORATORY DATA:  I have reviewed the data as listed CMP Latest Ref Rng & Units 09/18/2019 09/16/2019 09/12/2019  Glucose 70 - 99 mg/dL 88 139(H) 143(H)  BUN 8 - 23 mg/dL <5(L) 5(L) 11  Creatinine 0.61 - 1.24 mg/dL 0.54(L) 0.56(L) 0.66  Sodium 135 - 145 mmol/L 131(L) 132(L) 131(L)  Potassium 3.5 - 5.1 mmol/L 3.4(L) 3.6 4.4  Chloride 98 - 111 mmol/L 99 100 96(L)  CO2 22 - 32 mmol/L _0 Calcium 8.9 - 10.3 mg/dL 8.2(L) 8.3(L) 8.8(L)  Total Protein 6.5 - 8.1 g/dL - 6.2(L) 6.9  Total Bilirubin 0.3 - 1.2 mg/dL - 0.6 0.7  Alkaline Phos 38 - 126 U/L - 112 142(H)  AST 15 - 41 U/L - 45(H) 36  ALT 0 - 44 U/L - 19 11    Lab Results  Component Value Date   WBC 2.6 (L) 09/18/2019   HGB 9.7 (L) 09/18/2019   HCT 29.7 (L) 09/18/2019   MCV 81.6 09/18/2019   PLT 335 09/18/2019   NEUTROABS 0.5 (L) 09/18/2019    CT Chest Wo Contrast  Result Date: 09/16/2019 CLINICAL DATA:  Persistent artifact versus pneumothorax on plain film with cough and fever EXAM: CT CHEST WITHOUT CONTRAST TECHNIQUE: Multidetector CT imaging  of the chest was performed following the standard protocol without IV contrast. COMPARISON:  CT abdomen pelvis 08/25/2019, radiograph 09/16/2019. FINDINGS: Cardiovascular: Normal heart size. No pericardial effusion. Accessed right IJ Port-A-Cath tip positioned at the right atrium. Few calcifications at the aortic leaflets. Coronary calcifications are present as well. Atherosclerotic plaque within the normal caliber aorta. Normal 3 vessel branching of the aortic arch. Central pulmonary arteries are normal caliber. Mediastinum/Nodes: No mediastinal fluid or gas. Normal thyroid gland and thoracic inlet. No acute abnormality of the trachea or esophagus. Few calcified left hilar nodes, likely sequela of granulomatous disease. No worrisome mediastinal or axillary adenopathy. Hilar nodal evaluation is limited in the absence of intravenous contrast media. Lungs/Pleura: Few scattered solid calcified nodules throughout the lungs include: 4 mm subpleural nodule in the left lung apex (7/31, solid irregular 4 mm calcified nodule more medially left apex (7/30), calcified 7 mm ureter subpleural node in the lingula (7/108), 3 mm subpleural nodule along the right heart border in the right middle lobe (7/108). Some bandlike areas of scarring present in the lung apices. Mild airways thickening more pronounced towards the lower lungs. Dependent atelectasis posteriorly with more dense opacity in the periphery of the right lung base which could reflect further subsegmental atelectasis though some underlying consolidation or aspiration is a possibility as well. Upper Abdomen: Multiple hypoattenuating lesions seen throughout the liver. Redemonstration of the large air and fluid containing mass arising from the pancreatic body measuring up to the 11.4 x 8.8 cm on this exam and involving the lesser curvature of the stomach and gastric fundus. Musculoskeletal: Multilevel degenerative changes are present in the imaged portions of the spine. No  acute osseous abnormality or suspicious osseous lesion. No worrisome chest wall lesions. Paucity of subcutaneous fat. Right chest wall port catheter. IMPRESSION: 1. No pneumothorax. Previously seen artifact likely reflected a skin fold. 2. Dependent atelectasis posteriorly with more dense opacity in the periphery of the right lung base which could reflect further subsegmental atelectasis though some underlying consolidation or aspiration is a possibility particularly given the involvement of the gastric fundus and lesser curvature by the large air and fluid containing pancreatic mass better detailed on comparison CT abdomen pelvis. 3. Multiple hypoattenuating lesions throughout the liver, compatible with metastatic disease. 4. Calcified left hilar nodes with several scattered sub 5 mm calcified and noncalcified nodules throughout the lungs. May reflect sequela of granulomatous disease though continued attention on follow-up imaging is recommended in the setting of known malignancy. 5. Accessed right IJ Port-A-Cath tip positioned at the right atrium. 6. Aortic Atherosclerosis (ICD10-I70.0). Electronically Signed   By: Lovena Le M.D.   On: 09/16/2019 23:04   CT ABDOMEN PELVIS W CONTRAST  Result Date: 08/25/2019 CLINICAL DATA:  Abdominal distension and pain. Recently diagnosed with pancreatic cancer in [place]. No reported therapy. History of diabetes. EXAM: CT ABDOMEN AND PELVIS WITH CONTRAST TECHNIQUE: Multidetector  CT imaging of the abdomen and pelvis was performed using the standard protocol following bolus administration of intravenous contrast. CONTRAST:  167m OMNIPAQUE IOHEXOL 300 MG/ML  SOLN COMPARISON:  None. FINDINGS: Lower chest: Mild linear atelectasis or scarring at both lung bases. There is a calcified granuloma in the lingula. No significant pleural or pericardial effusion. Hepatobiliary: There are multiple metastases throughout all segments of the liver which demonstrate early peripheral  enhancement. Representative lesions on series 7 include a 3.2 cm lesion in the anterior dome of the right lobe on image 14, a 3.4 cm lesion in the far lateral segment of the left lobe on image 19, a 4.3 cm lesion in the left lobe on image 21 and a 2.1 cm lesion more inferiorly in the right lobe on image 40. no evidence of gallstones, gallbladder wall thickening or biliary dilatation. Pancreas: There is a very large, centrally necrotic mass in the left upper quadrant of the abdomen. This measures 12.2 x 9.0 x 8.6 cm and has thick irregular enhancing walls, central low-density and multiple central air bubbles. This mass is located between the liver, stomach and pancreas, and appears inseparable from the lesser curvature of the stomach. This mass also abuts and inferiorly displaces the pancreatic body and tail, but is not favored to represent a primary pancreatic process. There is no pancreatic ductal dilatation. Spleen: Normal in size without focal abnormality. Adrenals/Urinary Tract: Both adrenal glands appear normal. The kidneys appear normal without evidence of urinary tract calculus, suspicious lesion or hydronephrosis. No bladder abnormalities are seen. Stomach/Bowel: As above, large centrally necrotic left upper quadrant mass along the lesser curvature of the stomach, favored to reflect a primary gastric malignancy (likely gastrointestinal stromal tumor). The air within this lesion may be secondary to central necrosis and/or communication with the gastric lumen. The small bowel, appendix and colon appear normal. There is moderate stool throughout the colon. No evidence of bowel obstruction or perforation. Vascular/Lymphatic: There are no enlarged abdominal or pelvic lymph nodes. There are multiple small lymph nodes in the gastrohepatic ligament and retroperitoneum which are not pathologically enlarged. Mild aortic and branch vessel atherosclerosis. No vascular encasement. A metastasis within the caudate lobe of  the liver exerts mild mass effect on the IVC which remains patent. The portal, superior mesenteric, splenic and hepatic veins are patent. Reproductive: Mild enlargement of the prostate gland. Other: Intact anterior abdominal wall. No ascites or peritoneal nodularity. Musculoskeletal: No acute or significant osseous findings. Mild lumbar spondylosis. IMPRESSION: 1. 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 This mass is located between the liver, stomach and pancreas but is not favored to arise from the pancreas. Gas within the lesion may be secondary necrosis or communication with the gastric lumen. 2. Multiple hepatic metastases. Numerous prominent lymph nodes in the upper abdomen, not pathologically enlarged. 3. No evidence of bowel obstruction or perforation. 4. Aortic Atherosclerosis (ICD10-I70.0). Electronically Signed   By: WRichardean SaleM.D.   On: 08/25/2019 13:29   DG Chest Port 1 View  Result Date: 09/16/2019 CLINICAL DATA:  74year old male with fever. EXAM: PORTABLE CHEST 1 VIEW COMPARISON:  None. FINDINGS: The lungs are clear. There is no pleural effusion. Linear lucency along the peripheral right lung, likely artifactual and related to skin fold. A pneumothorax is less likely. A 4 mm left lung base calcified granuloma. The cardiac silhouette is within limits. Left hilar calcified gland normal noted. Right-sided Port-A-Cath with  tip at the cavoatrial junction. No acute osseous pathology. IMPRESSION: 1. No focal consolidation. 2. Artifact versus less likely a right-sided pneumothorax. Electronically Signed   By: Anner Crete M.D.   On: 09/16/2019 21:27   IR IMAGING GUIDED PORT INSERTION  Result Date: 09/03/2019 INDICATION: Pancreatic cancer. In need of durable intravenous access for chemotherapy administration. EXAM: IMPLANTED PORT A CATH PLACEMENT WITH ULTRASOUND AND FLUOROSCOPIC GUIDANCE  COMPARISON:  None. MEDICATIONS: Ancef 2 gm IV; The antibiotic was administered within an appropriate time interval prior to skin puncture. ANESTHESIA/SEDATION: Moderate (conscious) sedation was employed during this procedure. A total of Versed 2 mg and Fentanyl 100 mcg was administered intravenously. Moderate Sedation Time: 20 minutes. The patient's level of consciousness and vital signs were monitored continuously by radiology nursing throughout the procedure under my direct supervision. CONTRAST:  None FLUOROSCOPY TIME:  12 seconds (2 mGy) COMPLICATIONS: None immediate. PROCEDURE: The procedure, risks, benefits, and alternatives were explained to the patient. Questions regarding the procedure were encouraged and answered. The patient understands and consents to the procedure. The right neck and chest were prepped with chlorhexidine in a sterile fashion, and a sterile drape was applied covering the operative field. Maximum barrier sterile technique with sterile gowns and gloves were used for the procedure. A timeout was performed prior to the initiation of the procedure. Local anesthesia was provided with 1% lidocaine with epinephrine. After creating a small venotomy incision, a micropuncture kit was utilized to access the internal jugular vein. Real-time ultrasound guidance was utilized for vascular access including the acquisition of a permanent ultrasound image documenting patency of the accessed vessel. The microwire was utilized to measure appropriate catheter length. A subcutaneous port pocket was then created along the upper chest wall utilizing a combination of sharp and blunt dissection. The pocket was irrigated with sterile saline. A single lumen "Slim" sized power injectable port was chosen for placement. The 8 Fr catheter was tunneled from the port pocket site to the venotomy incision. The port was placed in the pocket. The external catheter was trimmed to appropriate length. At the venotomy, an 8 Fr  peel-away sheath was placed over a guidewire under fluoroscopic guidance. The catheter was then placed through the sheath and the sheath was removed. Final catheter positioning was confirmed and documented with a fluoroscopic spot radiograph. The port was accessed with a Huber needle, aspirated and flushed with heparinized saline. The venotomy site was closed with an interrupted 4-0 Vicryl suture. The port pocket incision was closed with interrupted 2-0 Vicryl suture. The skin was opposed with a running subcuticular 4-0 Vicryl suture. Dermabond and Steri-strips were applied to both incisions. Dressings were applied. The patient tolerated the procedure well without immediate post procedural complication. FINDINGS: After catheter placement, the tip lies within the superior cavoatrial junction. The catheter aspirates and flushes normally and is ready for immediate use. IMPRESSION: Successful placement of a right internal jugular approach power injectable Port-A-Cath. The catheter is ready for immediate use. Electronically Signed   By: Sandi Mariscal M.D.   On: 09/03/2019 13:05    ASSESSMENT AND PLAN: 1. Metastatic gastric cancer  EGD 08/15/2019-large ulcerated lesion in the gastric cardia immediately below the GE junction, malignant appearing. Biopsy-adenocarcinoma, poorly differentiated; HER-2 negative, PD1 combined positive score 3  CT abdomen/pelvis August 25, 2019-large centrally necrotic mass left upper quadrant of the abdomen measuring 12.2 x 9.0 x 8.6 cm, thick irregular enhancing walls, central low density and multiple central air bubbles; the mass is located between the liver,  stomach and pancreas and appears inseparable from the lesser curvature of the stomach. Multiple liver lesions throughout all segments. Metastasis within the caudate lobe of the liver exerts mild mass-effect on the IVC which remains patent. Multiple small lymph nodes in the gastrohepatic ligament and retroperitoneum, not  pathologically enlarged.  Cycle 1 FOLFOX 09/04/2019 2. Abdominal pain secondary to #1  3. Dysphagia/odynophagia secondary to #1  4. Anorexia/weight loss secondary to #1  5. Diabetes 6. Mucositis secondary to chemotherapy.  Improved 09/15/2019. 7. Admission 09/16/2019 with febrile neutropenia, cough-right lower lobe consolidation on CT  Mr. Memoli is now admitted to the hospital for febrile neutropenia.  He is currently afebrile.  Urine cultures without growth and blood cultures are negative to date.  He has persistent neutropenia and anemia.  The patient was scheduled for chemotherapy in our office today which will be canceled.  Recommendations: 1.  Monitor CBC with differential daily. 2.  We will hold off on Granix at this time as he is currently afebrile. 3.  Continue to follow cultures. 4.  We will reschedule his FOLFOX and nivolumab for next week as an outpatient.   LOS: 1 day   Mikey Bussing, DNP, AGPCNP-BC, AOCNP 09/18/19 Mr. Alavarenga was examined.  A Spanish interpreter was not present when I saw him early this morning.  I will return in the afternoon to discuss the situation with Mr. Malkin and his family.  He is admitted with febrile neutropenia and symptoms/CT findings suggestive of a right lung pneumonia.  He appears clinically stable.  Cycle 2 FOLFOX will be held.  He will receive G-CSF support with cycle 2 next week.  He will also receive nivolumab with cycle 2.  We will review images with radiology to discuss the primary tumor site, most likely gastric versus pancreatic.

## 2019-09-18 NOTE — Evaluation (Signed)
Clinical/Bedside Swallow Evaluation Patient Details  Name: Alexander Lawson MRN: [id number] Date of Birth: May 01, 1946  Today's Date: 09/18/2019 Time: SLP Start Time (ACUTE ONLY): 0900 SLP Stop Time (ACUTE ONLY): 0915 SLP Time Calculation (min) (ACUTE ONLY): 15 min  Past Medical History:  Past Medical History:  Diagnosis Date  . Cancer ([place])    pancreas  . Diabetes mellitus without complication Alexander Lawson)    Past Surgical History:  Past Surgical History:  Procedure Laterality Date  . IR IMAGING GUIDED PORT INSERTION  09/03/2019   HPI:  Patient is a 74 year old male with with history of metastatic gastric cancer, currently on chemotherapy, insulin-dependent diabetes who presents to the emergency department with complaints of generalized weakness, fever, cough.  Patient speaks Spanish.  Patient also reported fevers and chills at home.  On presentation he was hypotensive, tachycardic.  Lab work showed leukopenia, neutropenia.  Chest x-ray did not show any focal consolidation but chest CT showed dense opacity in the periphery of the right lung base resting consolidation versus aspiration.   Assessment / Plan / Recommendation Clinical Impression   Alexander Lawson was seen for a clinical swallow evaluation, which was largely unremarkable. Oral mech exam revealed poor dentition (no teeth on top and several on bottom were in poor state). Pt reported he could still eat hard foods despite this. Pt consumed thin liquids by cup and straw and consumed 3 oz consecutively without s/s aspiration, making him at low risk for aspiration from the swallow/PO intake. Pt with complicated medical history, concerning for possible aspiration from gastric contents with known esophageal cancer. Per discussion with Alexander Solo, MD, pt was placed on regular solids yesterday at pt request. Based on hospitalization for possible aspiration PNA/consolidation in RLL and no oropharyngeal symptoms, recommend regular barium swallow study  (esophagram) be completed to r/o aspiration of gastric contents d/t dysmotility in esophagus pending patient's goals of care. Pt consumed regular solids and thin liquids without difficulty. Recommend continue this diet pending results of swallow study and patient wishes.    SLP Visit Diagnosis: Dysphagia, unspecified (R13.10)    Aspiration Risk  Moderate aspiration risk;Severe aspiration risk;Risk for inadequate nutrition/hydration    Diet Recommendation Thin liquid;Regular   Liquid Administration via: Straw;Spoon Medication Administration: Whole meds with liquid Supervision: Patient able to self feed Compensations: Follow solids with liquid Postural Changes: Seated upright at 90 degrees;Remain upright for at least 30 minutes after po intake    Other  Recommendations Recommended Consults: Consider esophageal assessment Oral Care Recommendations: Oral care BID   Follow up Recommendations None by ST; esophagram recommended          Prognosis Prognosis for Safe Diet Advancement: Good Barriers to Reach Goals: Other (Comment)(esophageal cancer dx)      Swallow Study   General HPI: Patient is a 74 year old male with with history of metastatic gastric cancer, currently on chemotherapy, insulin-dependent diabetes who presents to the emergency department with complaints of generalized weakness, fever, cough.  Patient speaks Spanish.  Patient also reported fevers and chills at home.  On presentation he was hypotensive, tachycardic.  Lab work showed leukopenia, neutropenia.  Chest x-ray did not show any focal consolidation but chest CT showed dense opacity in the periphery of the right lung base resting consolidation versus aspiration. Type of Study: Bedside Swallow Evaluation Previous Swallow Assessment: n/a Diet Prior to this Study: Regular Temperature Spikes Noted: No Respiratory Status: Room air History of Recent Intubation: No Behavior/Cognition: Alert;Cooperative;Pleasant mood Oral  Cavity Assessment: Within Functional Limits Oral Care Completed  by SLP: Recent completion by staff Oral Cavity - Dentition: Missing dentition(edentulous top; poor dentition bottom) Self-Feeding Abilities: Able to feed self Patient Positioning: Upright in bed Baseline Vocal Quality: Normal Volitional Swallow: Able to elicit    Oral/Motor/Sensory Function Overall Oral Motor/Sensory Function: Within functional limits   Thin Liquid Thin Liquid: Within functional limits Presentation: Cup;Straw Other Comments: 3 oz consumed consecutively    Puree Puree: Within functional limits Presentation: Alexander Lawson. Alexander Lawson, M.S., CCC-SLP Speech-Language Pathologist Acute Rehabilitation Services Pager: [phone]    Solid: Within functional limits      Goff 09/18/2019,9:16 AM

## 2019-09-19 ENCOUNTER — Other Ambulatory Visit: Payer: Self-pay | Admitting: Oncology

## 2019-09-19 DIAGNOSIS — D709 Neutropenia, unspecified: Secondary | ICD-10-CM

## 2019-09-19 LAB — CBC WITH DIFFERENTIAL/PLATELET
Abs Immature Granulocytes: 0.02 10*3/uL (ref 0.00–0.07)
Basophils Absolute: 0 10*3/uL (ref 0.0–0.1)
Basophils Relative: 0 %
Eosinophils Absolute: 0.2 10*3/uL (ref 0.0–0.5)
Eosinophils Relative: 6 %
HCT: 31.2 % — ABNORMAL LOW (ref 39.0–52.0)
Hemoglobin: 10.1 g/dL — ABNORMAL LOW (ref 13.0–17.0)
Immature Granulocytes: 1 %
Lymphocytes Relative: 56 %
Lymphs Abs: 1.5 10*3/uL (ref 0.7–4.0)
MCH: 26.5 pg (ref 26.0–34.0)
MCHC: 32.4 g/dL (ref 30.0–36.0)
MCV: 81.9 fL (ref 80.0–100.0)
Monocytes Absolute: 0.6 10*3/uL (ref 0.1–1.0)
Monocytes Relative: 21 %
Neutro Abs: 0.4 10*3/uL — ABNORMAL LOW (ref 1.7–7.7)
Neutrophils Relative %: 16 %
Platelets: 411 10*3/uL — ABNORMAL HIGH (ref 150–400)
RBC: 3.81 MIL/uL — ABNORMAL LOW (ref 4.22–5.81)
RDW: 14.3 % (ref 11.5–15.5)
WBC: 2.7 10*3/uL — ABNORMAL LOW (ref 4.0–10.5)
nRBC: 0 % (ref 0.0–0.2)

## 2019-09-19 LAB — GLUCOSE, CAPILLARY
Glucose-Capillary: 80 mg/dL (ref 70–99)
Glucose-Capillary: 101 mg/dL — ABNORMAL HIGH (ref 70–99)

## 2019-09-19 LAB — BASIC METABOLIC PANEL
Anion gap: 8 (ref 5–15)
BUN: 7 mg/dL — ABNORMAL LOW (ref 8–23)
CO2: 24 mmol/L (ref 22–32)
Calcium: 8.5 mg/dL — ABNORMAL LOW (ref 8.9–10.3)
Chloride: 103 mmol/L (ref 98–111)
Creatinine, Ser: 0.54 mg/dL — ABNORMAL LOW (ref 0.61–1.24)
GFR calc Af Amer: >60 mL/min (ref >60)
GFR calc non Af Amer: >60 mL/min (ref >60)
Glucose, Bld: 97 mg/dL (ref 70–99)
Potassium: 3.6 mmol/L (ref 3.5–5.1)
Sodium: 135 mmol/L (ref 135–145)

## 2019-09-19 MED ORDER — HEPARIN SOD (PORK) LOCK FLUSH 100 UNIT/ML IV SOLN
500.0000 [IU] | INTRAVENOUS | Status: AC | PRN
  Administered 2019-09-19: 500 [IU]
  Filled 2019-09-19: qty 5

## 2019-09-19 MED ORDER — TBO-FILGRASTIM 300 MCG/0.5ML ~~LOC~~ SOSY
300.0000 ug | PREFILLED_SYRINGE | Freq: Once | SUBCUTANEOUS | Status: AC
  Administered 2019-09-19: 300 ug via SUBCUTANEOUS
  Filled 2019-09-19: qty 0.5

## 2019-09-19 MED ORDER — POLYETHYLENE GLYCOL 3350 17 G PO PACK: 17.0000 g | PACK | Freq: Every day | ORAL | 0 refills | Status: DC

## 2019-09-19 MED ORDER — CIPROFLOXACIN HCL 500 MG PO TABS
500.0000 mg | ORAL_TABLET | Freq: Two times a day (BID) | ORAL | 0 refills | Status: AC
Start: 2019-09-19 — End: 2019-09-26

## 2019-09-19 MED ORDER — FERROUS SULFATE 325 (65 FE) MG PO TABS: 325.0000 mg | ORAL_TABLET | Freq: Every day | ORAL | 1 refills | Status: DC

## 2019-09-19 MED ORDER — SODIUM CHLORIDE 0.9 % IV SOLN
510.0000 mg | Freq: Once | INTRAVENOUS | Status: AC
  Administered 2019-09-19: 510 mg via INTRAVENOUS
  Filled 2019-09-19: qty 510

## 2019-09-19 NOTE — Progress Notes (Signed)
Pt being discharged to home with wife. Interpreter used to provide discharge instructions and medication education.

## 2019-09-19 NOTE — Progress Notes (Addendum)
HEMATOLOGY-ONCOLOGY PROGRESS NOTE  SUBJECTIVE: The patient was seen with the assistance of a Spanish interpreter.  Continues to feel well.  He remains afebrile.  No chills.  Denies abdominal pain, nausea, vomiting.  Oncology History  Gastric cancer ([place])  08/29/2019 Initial Diagnosis   Gastric cancer ([place])   09/04/2019 -  Chemotherapy   The patient had palonosetron (ALOXI) injection 0.25 mg, 0.25 mg, Intravenous,  Once, 1 of 6 cycles Administration: 0.25 mg (09/04/2019) leucovorin 652 mg in dextrose 5 % 250 mL infusion, 400 mg/m2 = 652 mg, Intravenous,  Once, 1 of 6 cycles Administration: 652 mg (09/04/2019) oxaliplatin (ELOXATIN) 140 mg in dextrose 5 % 500 mL chemo infusion, 85 mg/m2 = 140 mg, Intravenous,  Once, 1 of 6 cycles Administration: 140 mg (09/04/2019) fluorouracil (ADRUCIL) chemo injection 650 mg, 400 mg/m2 = 650 mg, Intravenous,  Once, 1 of 1 cycle Administration: 650 mg (09/04/2019) fluorouracil (ADRUCIL) 3,900 mg in sodium chloride 0.9 % 72 mL chemo infusion, 2,400 mg/m2 = 3,900 mg, Intravenous, 1 Day/Dose, 1 of 6 cycles Dose modification: 1,680 mg/m2 (original dose 2,400 mg/m2, Cycle 2, Reason: Provider Judgment) Administration: 3,900 mg (09/04/2019)  for chemotherapy treatment.    09/18/2019 -  Chemotherapy   The patient had nivolumab (OPDIVO) 240 mg in sodium chloride 0.9 % 100 mL chemo infusion, 240 mg, Intravenous, Once, 0 of 6 cycles  for chemotherapy treatment.     PHYSICAL EXAMINATION:  Vitals:   09/18/19 2300 09/19/19 0527  BP:  129/78  Pulse:  80  Resp:  16  Temp:  98.2 F (36.8 C)  SpO2: 99% 100%   There were no vitals filed for this visit.  Intake/Output from previous day: 05/06 0701 - 05/07 0700 In: 708 [P.O.:708] Out: 1250 [Urine:1250]  GENERAL:alert, no distress and comfortable OROPHARYNX: No thrush or mucositis LUNGS: clear to auscultation and percussion with normal breathing effort HEART: regular rate & rhythm and no murmurs and no lower  extremity edema ABDOMEN:abdomen soft, non-tender and normal bowel sounds  NEURO: alert & oriented x 3 with fluent speech, no focal motor/sensory deficits  Port-A-Cath without erythema  LABORATORY DATA:  I have reviewed the data as listed CMP Latest Ref Rng & Units 09/19/2019 09/18/2019 09/16/2019  Glucose 70 - 99 mg/dL 97 88 139(H)  BUN 8 - 23 mg/dL 7(L) <5(L) 5(L)  Creatinine 0.61 - 1.24 mg/dL 0.54(L) 0.54(L) 0.56(L)  Sodium 135 - 145 mmol/L 135 131(L) 132(L)  Potassium 3.5 - 5.1 mmol/L 3.6 3.4(L) 3.6  Chloride 98 - 111 mmol/L 103 99 100  CO2 22 - 32 mmol/L _0 Calcium 8.9 - 10.3 mg/dL 8.5(L) 8.2(L) 8.3(L)  Total Protein 6.5 - 8.1 g/dL - - 6.2(L)  Total Bilirubin 0.3 - 1.2 mg/dL - - 0.6  Alkaline Phos 38 - 126 U/L - - 112  AST 15 - 41 U/L - - 45(H)  ALT 0 - 44 U/L - - 19    Lab Results  Component Value Date   WBC 2.7 (L) 09/19/2019   HGB 10.1 (L) 09/19/2019   HCT 31.2 (L) 09/19/2019   MCV 81.9 09/19/2019   PLT 411 (H) 09/19/2019   NEUTROABS 0.4 (L) 09/19/2019    CT Chest Wo Contrast  Result Date: 09/16/2019 CLINICAL DATA:  Persistent artifact versus pneumothorax on plain film with cough and fever EXAM: CT CHEST WITHOUT CONTRAST TECHNIQUE: Multidetector CT imaging of the chest was performed following the standard protocol without IV contrast. COMPARISON:  CT abdomen pelvis 08/25/2019, radiograph 09/16/2019. FINDINGS:  Cardiovascular: Normal heart size. No pericardial effusion. Accessed right IJ Port-A-Cath tip positioned at the right atrium. Few calcifications at the aortic leaflets. Coronary calcifications are present as well. Atherosclerotic plaque within the normal caliber aorta. Normal 3 vessel branching of the aortic arch. Central pulmonary arteries are normal caliber. Mediastinum/Nodes: No mediastinal fluid or gas. Normal thyroid gland and thoracic inlet. No acute abnormality of the trachea or esophagus. Few calcified left hilar nodes, likely sequela of granulomatous disease.  No worrisome mediastinal or axillary adenopathy. Hilar nodal evaluation is limited in the absence of intravenous contrast media. Lungs/Pleura: Few scattered solid calcified nodules throughout the lungs include: 4 mm subpleural nodule in the left lung apex (7/31, solid irregular 4 mm calcified nodule more medially left apex (7/30), calcified 7 mm ureter subpleural node in the lingula (7/108), 3 mm subpleural nodule along the right heart border in the right middle lobe (7/108). Some bandlike areas of scarring present in the lung apices. Mild airways thickening more pronounced towards the lower lungs. Dependent atelectasis posteriorly with more dense opacity in the periphery of the right lung base which could reflect further subsegmental atelectasis though some underlying consolidation or aspiration is a possibility as well. Upper Abdomen: Multiple hypoattenuating lesions seen throughout the liver. Redemonstration of the large air and fluid containing mass arising from the pancreatic body measuring up to the 11.4 x 8.8 cm on this exam and involving the lesser curvature of the stomach and gastric fundus. Musculoskeletal: Multilevel degenerative changes are present in the imaged portions of the spine. No acute osseous abnormality or suspicious osseous lesion. No worrisome chest wall lesions. Paucity of subcutaneous fat. Right chest wall port catheter. IMPRESSION: 1. No pneumothorax. Previously seen artifact likely reflected a skin fold. 2. Dependent atelectasis posteriorly with more dense opacity in the periphery of the right lung base which could reflect further subsegmental atelectasis though some underlying consolidation or aspiration is a possibility particularly given the involvement of the gastric fundus and lesser curvature by the large air and fluid containing pancreatic mass better detailed on comparison CT abdomen pelvis. 3. Multiple hypoattenuating lesions throughout the liver, compatible with metastatic  disease. 4. Calcified left hilar nodes with several scattered sub 5 mm calcified and noncalcified nodules throughout the lungs. May reflect sequela of granulomatous disease though continued attention on follow-up imaging is recommended in the setting of known malignancy. 5. Accessed right IJ Port-A-Cath tip positioned at the right atrium. 6. Aortic Atherosclerosis (ICD10-I70.0). Electronically Signed   By: Lovena Le M.D.   On: 09/16/2019 23:04   CT ABDOMEN PELVIS W CONTRAST  Result Date: 08/25/2019 CLINICAL DATA:  Abdominal distension and pain. Recently diagnosed with pancreatic cancer in [place]. No reported therapy. History of diabetes. EXAM: CT ABDOMEN AND PELVIS WITH CONTRAST TECHNIQUE: Multidetector CT imaging of the abdomen and pelvis was performed using the standard protocol following bolus administration of intravenous contrast. CONTRAST:  181m OMNIPAQUE IOHEXOL 300 MG/ML  SOLN COMPARISON:  None. FINDINGS: Lower chest: Mild linear atelectasis or scarring at both lung bases. There is a calcified granuloma in the lingula. No significant pleural or pericardial effusion. Hepatobiliary: There are multiple metastases throughout all segments of the liver which demonstrate early peripheral enhancement. Representative lesions on series 7 include a 3.2 cm lesion in the anterior dome of the right lobe on image 14, a 3.4 cm lesion in the far lateral segment of the left lobe on image 19, a 4.3 cm lesion in the left lobe on image 21 and a 2.1 cm  lesion more inferiorly in the right lobe on image 40. no evidence of gallstones, gallbladder wall thickening or biliary dilatation. Pancreas: There is a very large, centrally necrotic mass in the left upper quadrant of the abdomen. This measures 12.2 x 9.0 x 8.6 cm and has thick irregular enhancing walls, central low-density and multiple central air bubbles. This mass is located between the liver, stomach and pancreas, and appears inseparable from the lesser curvature of  the stomach. This mass also abuts and inferiorly displaces the pancreatic body and tail, but is not favored to represent a primary pancreatic process. There is no pancreatic ductal dilatation. Spleen: Normal in size without focal abnormality. Adrenals/Urinary Tract: Both adrenal glands appear normal. The kidneys appear normal without evidence of urinary tract calculus, suspicious lesion or hydronephrosis. No bladder abnormalities are seen. Stomach/Bowel: As above, large centrally necrotic left upper quadrant mass along the lesser curvature of the stomach, favored to reflect a primary gastric malignancy (likely gastrointestinal stromal tumor). The air within this lesion may be secondary to central necrosis and/or communication with the gastric lumen. The small bowel, appendix and colon appear normal. There is moderate stool throughout the colon. No evidence of bowel obstruction or perforation. Vascular/Lymphatic: There are no enlarged abdominal or pelvic lymph nodes. There are multiple small lymph nodes in the gastrohepatic ligament and retroperitoneum which are not pathologically enlarged. Mild aortic and branch vessel atherosclerosis. No vascular encasement. A metastasis within the caudate lobe of the liver exerts mild mass effect on the IVC which remains patent. The portal, superior mesenteric, splenic and hepatic veins are patent. Reproductive: Mild enlargement of the prostate gland. Other: Intact anterior abdominal wall. No ascites or peritoneal nodularity. Musculoskeletal: No acute or significant osseous findings. Mild lumbar spondylosis. IMPRESSION: 1. 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 This mass is located between the liver, stomach and pancreas but is not favored to arise from the pancreas. Gas within the lesion may be secondary necrosis or communication with the gastric lumen. 2. Multiple hepatic  metastases. Numerous prominent lymph nodes in the upper abdomen, not pathologically enlarged. 3. No evidence of bowel obstruction or perforation. 4. Aortic Atherosclerosis (ICD10-I70.0). Electronically Signed   By: Richardean Sale M.D.   On: 08/25/2019 13:29   DG Chest Port 1 View  Result Date: 09/16/2019 CLINICAL DATA:  74 year old male with fever. EXAM: PORTABLE CHEST 1 VIEW COMPARISON:  None. FINDINGS: The lungs are clear. There is no pleural effusion. Linear lucency along the peripheral right lung, likely artifactual and related to skin fold. A pneumothorax is less likely. A 4 mm left lung base calcified granuloma. The cardiac silhouette is within limits. Left hilar calcified gland normal noted. Right-sided Port-A-Cath with tip at the cavoatrial junction. No acute osseous pathology. IMPRESSION: 1. No focal consolidation. 2. Artifact versus less likely a right-sided pneumothorax. Electronically Signed   By: Anner Crete M.D.   On: 09/16/2019 21:27   IR IMAGING GUIDED PORT INSERTION  Result Date: 09/03/2019 INDICATION: Pancreatic cancer. In need of durable intravenous access for chemotherapy administration. EXAM: IMPLANTED PORT A CATH PLACEMENT WITH ULTRASOUND AND FLUOROSCOPIC GUIDANCE COMPARISON:  None. MEDICATIONS: Ancef 2 gm IV; The antibiotic was administered within an appropriate time interval prior to skin puncture. ANESTHESIA/SEDATION: Moderate (conscious) sedation was employed during this procedure. A total of Versed 2 mg and Fentanyl 100 mcg was administered intravenously. Moderate Sedation Time: 20 minutes. The patient's level of consciousness and vital  signs were monitored continuously by radiology nursing throughout the procedure under my direct supervision. CONTRAST:  None FLUOROSCOPY TIME:  12 seconds (2 mGy) COMPLICATIONS: None immediate. PROCEDURE: The procedure, risks, benefits, and alternatives were explained to the patient. Questions regarding the procedure were encouraged and  answered. The patient understands and consents to the procedure. The right neck and chest were prepped with chlorhexidine in a sterile fashion, and a sterile drape was applied covering the operative field. Maximum barrier sterile technique with sterile gowns and gloves were used for the procedure. A timeout was performed prior to the initiation of the procedure. Local anesthesia was provided with 1% lidocaine with epinephrine. After creating a small venotomy incision, a micropuncture kit was utilized to access the internal jugular vein. Real-time ultrasound guidance was utilized for vascular access including the acquisition of a permanent ultrasound image documenting patency of the accessed vessel. The microwire was utilized to measure appropriate catheter length. A subcutaneous port pocket was then created along the upper chest wall utilizing a combination of sharp and blunt dissection. The pocket was irrigated with sterile saline. A single lumen "Slim" sized power injectable port was chosen for placement. The 8 Fr catheter was tunneled from the port pocket site to the venotomy incision. The port was placed in the pocket. The external catheter was trimmed to appropriate length. At the venotomy, an 8 Fr peel-away sheath was placed over a guidewire under fluoroscopic guidance. The catheter was then placed through the sheath and the sheath was removed. Final catheter positioning was confirmed and documented with a fluoroscopic spot radiograph. The port was accessed with a Huber needle, aspirated and flushed with heparinized saline. The venotomy site was closed with an interrupted 4-0 Vicryl suture. The port pocket incision was closed with interrupted 2-0 Vicryl suture. The skin was opposed with a running subcuticular 4-0 Vicryl suture. Dermabond and Steri-strips were applied to both incisions. Dressings were applied. The patient tolerated the procedure well without immediate post procedural complication. FINDINGS:  After catheter placement, the tip lies within the superior cavoatrial junction. The catheter aspirates and flushes normally and is ready for immediate use. IMPRESSION: Successful placement of a right internal jugular approach power injectable Port-A-Cath. The catheter is ready for immediate use. Electronically Signed   By: Sandi Mariscal M.D.   On: 09/03/2019 13:05    ASSESSMENT AND PLAN: 1. Metastatic gastric cancer  EGD 08/15/2019-large ulcerated lesion in the gastric cardia immediately below the GE junction, malignant appearing. Biopsy-adenocarcinoma, poorly differentiated; HER-2 negative, PD1 combined positive score 3  CT abdomen/pelvis August 25, 2019-large centrally necrotic mass left upper quadrant of the abdomen measuring 12.2 x 9.0 x 8.6 cm, thick irregular enhancing walls, central low density and multiple central air bubbles; the mass is located between the liver, stomach and pancreas and appears inseparable from the lesser curvature of the stomach. Multiple liver lesions throughout all segments. Metastasis within the caudate lobe of the liver exerts mild mass-effect on the IVC which remains patent. Multiple small lymph nodes in the gastrohepatic ligament and retroperitoneum, not pathologically enlarged.  Cycle 1 FOLFOX 09/04/2019 2. Abdominal pain secondary to #1  3. Dysphagia/odynophagia secondary to #1  4. Anorexia/weight loss secondary to #1  5. Diabetes 6. Mucositis secondary to chemotherapy.  Improved 09/15/2019. 7. Admission 09/16/2019 with febrile neutropenia, cough-right lower lobe consolidation on CT  Mr. Meenach appears stable.  Remains afebrile and urine culture was without growth and blood cultures remain negative to date.  He has persistent neutropenia and anemia.  Discussed with hospitalist who anticipates discharge home later today.  Recommendations: 1.  Begin Granix 300 mcg subcu daily until ANC is above 1.0. 2.  Recommend discharge home on Cipro 500 mg twice daily for 7  days.  3.  I have requested an outpatient follow-up for repeat lab work on Monday, 09/22/2019 and for a visit on Thursday, 09/25/2010 21 for his next cycle of chemotherapy.   LOS: 2 days   Mikey Bussing, DNP, AGPCNP-BC, AOCNP 09/19/19 I saw him with an interpreter this morning.  He appears well.  He has no complaint.  The neutrophil count remains low.  Cultures remain negative.  He continues antibiotics for possible pneumonia.  We will add G-CSF in an attempt to expedite white cell recovery.  He should continue antibiotics until the neutrophil count has recovered.  He will be scheduled for an office visit prior to cycle 2 FOLFOX.

## 2019-09-19 NOTE — Discharge Summary (Signed)
Physician Discharge Summary  Alexander Lawson IEP:[id number] DOB: April 08, 1946 DOA: 09/16/2019  PCP: Patient, No Pcp Per  Admit date: 09/16/2019 Discharge date: 09/19/2019  Admitted From: Home Disposition:  Home  Discharge Condition:Stable CODE STATUS:FULL Diet recommendation: Regular  Brief/Interim Summary:  Patient is a 74 year old male with with history of metastatic gastric cancer, currently on chemotherapy, insulin-dependent diabetes who presents to the emergency department with complaints of generalized weakness, fever, cough.  Patient speaks Spanish.  Patient also reported fevers and chills at home.  On presentation he was hypotensive, tachycardic.  Lab work showed leukopenia, neutropenia.  Chest x-ray did not show any focal consolidation but chest CT showed dense opacity in the periphery of the right lung base resting consolidation versus aspiration.  Started on broad-spectrum antibiotics.  Cultures,no growth till date.  Oncology is also following.  Since he has remained hemodynamically stable, febrile since last few days, he will  been discharged home with oral antibiotics.  Following problems were addressed during his hospitalization:  Suspected sepsis/aspiration pneumonia: Hypotensive on presentation.  Improved with IV fluids. He was also febrile on presentation but not hypoxic.  Lactate is normal.  Chest x-ray as above. Covid negative. He was on IV antibiotics.  Cultures have been negative.  Antibiotics changed to oral.  Currently hemodynamically stable. Speech therapy evaluation done, no restriction.  Febrile neutropenia:   White cell counts have been stable and improved since admission. He was given a dose of Granix.  He will follow-up with oncology as an outpatient for CBC test .  Normocytic anemia: Most likely associated  with chemotherapy.  Severely deficient in iron. FOBT is positive but he has gastric malignancy so will not pursue for GI consultation.  We gave him a dose of IV  iron.  Iron supplementation on discharge.  Metastatic gastric cancer: Follows with oncology.  Status post cycle 1 FOLFOX on 09/04/19.  Pulmonary nodules: CT showing calcified left hilar nodules with severe scattered subcentimeter 5 mm calcified and noncalcified nodules throughout the lungs.  Follow-up imaging as an outpatient.  Insulin-dependent diabetes mellitus: Resume home regimen. Hemoglobin A1c is 7.7  Constipation: Continue MiraLAX  Severe protein calorie malnutrition: BMI of 17.  Albumin of 2.8.  Nutrition team following.    Discharge Diagnoses:  Principal Problem:   Aspiration pneumonia ([place]) Active Problems:   Gastric cancer ([place])   Sepsis ([place])   Neutropenia with fever ([place])   Anemia associated with chemotherapy    Discharge Instructions  Discharge Instructions    Diet general   Complete by: As directed    Discharge instructions   Complete by: As directed    1)Please take prescribed medications as instructed. 2)Follow up with your oncologist as an outpatient.  Do a CBC test during the follow-up.   Increase activity slowly   Complete by: As directed      Allergies as of 09/19/2019   No Known Allergies     Medication List    STOP taking these medications   HYDROcodone-acetaminophen 7.5-325 mg/15 ml solution Commonly known as: HYCET     TAKE these medications   ciprofloxacin 500 MG tablet Commonly known as: Cipro Take 1 tablet (500 mg total) by mouth 2 (two) times daily for 7 days.   ferrous sulfate 325 (65 FE) MG tablet Take 1 tablet (325 mg total) by mouth daily.   insulin NPH Human 100 UNIT/ML injection Commonly known as: NOVOLIN N Inject 5-10 Units into the skin daily as needed (5 units if BS over 150/10 units if  BS over 200).   lidocaine-prilocaine cream Commonly known as: EMLA Apply 1 application topically as directed. Apply to port site 1 hour prior to stick and cover with plastic wrap. Start with 2nd chemo treatment   magic mouthwash  Soln Take 5 mLs by mouth 4 (four) times daily as needed for mouth pain (swish and spit).   oxyCODONE 5 MG immediate release tablet Commonly known as: Roxicodone Take 1 tablet (5 mg total) by mouth every 6 (six) hours as needed for up to 20 doses for severe pain.   polyethylene glycol 17 g packet Commonly known as: MIRALAX / GLYCOLAX Take 17 g by mouth daily. Start taking on: Sep 20, 2019   prochlorperazine 10 MG tablet Commonly known as: COMPAZINE Take 1 tablet (10 mg total) by mouth every 6 (six) hours as needed for nausea.       No Known Allergies  Consultations:  Oncology   Procedures/Studies: CT Chest Wo Contrast  Result Date: 09/16/2019 CLINICAL DATA:  Persistent artifact versus pneumothorax on plain film with cough and fever EXAM: CT CHEST WITHOUT CONTRAST TECHNIQUE: Multidetector CT imaging of the chest was performed following the standard protocol without IV contrast. COMPARISON:  CT abdomen pelvis 08/25/2019, radiograph 09/16/2019. FINDINGS: Cardiovascular: Normal heart size. No pericardial effusion. Accessed right IJ Port-A-Cath tip positioned at the right atrium. Few calcifications at the aortic leaflets. Coronary calcifications are present as well. Atherosclerotic plaque within the normal caliber aorta. Normal 3 vessel branching of the aortic arch. Central pulmonary arteries are normal caliber. Mediastinum/Nodes: No mediastinal fluid or gas. Normal thyroid gland and thoracic inlet. No acute abnormality of the trachea or esophagus. Few calcified left hilar nodes, likely sequela of granulomatous disease. No worrisome mediastinal or axillary adenopathy. Hilar nodal evaluation is limited in the absence of intravenous contrast media. Lungs/Pleura: Few scattered solid calcified nodules throughout the lungs include: 4 mm subpleural nodule in the left lung apex (7/31, solid irregular 4 mm calcified nodule more medially left apex (7/30), calcified 7 mm ureter subpleural node in the  lingula (7/108), 3 mm subpleural nodule along the right heart border in the right middle lobe (7/108). Some bandlike areas of scarring present in the lung apices. Mild airways thickening more pronounced towards the lower lungs. Dependent atelectasis posteriorly with more dense opacity in the periphery of the right lung base which could reflect further subsegmental atelectasis though some underlying consolidation or aspiration is a possibility as well. Upper Abdomen: Multiple hypoattenuating lesions seen throughout the liver. Redemonstration of the large air and fluid containing mass arising from the pancreatic body measuring up to the 11.4 x 8.8 cm on this exam and involving the lesser curvature of the stomach and gastric fundus. Musculoskeletal: Multilevel degenerative changes are present in the imaged portions of the spine. No acute osseous abnormality or suspicious osseous lesion. No worrisome chest wall lesions. Paucity of subcutaneous fat. Right chest wall port catheter. IMPRESSION: 1. No pneumothorax. Previously seen artifact likely reflected a skin fold. 2. Dependent atelectasis posteriorly with more dense opacity in the periphery of the right lung base which could reflect further subsegmental atelectasis though some underlying consolidation or aspiration is a possibility particularly given the involvement of the gastric fundus and lesser curvature by the large air and fluid containing pancreatic mass better detailed on comparison CT abdomen pelvis. 3. Multiple hypoattenuating lesions throughout the liver, compatible with metastatic disease. 4. Calcified left hilar nodes with several scattered sub 5 mm calcified and noncalcified nodules throughout the lungs. May reflect sequela  of granulomatous disease though continued attention on follow-up imaging is recommended in the setting of known malignancy. 5. Accessed right IJ Port-A-Cath tip positioned at the right atrium. 6. Aortic Atherosclerosis (ICD10-I70.0).  Electronically Signed   By: Lovena Le M.D.   On: 09/16/2019 23:04   CT ABDOMEN PELVIS W CONTRAST  Result Date: 08/25/2019 CLINICAL DATA:  Abdominal distension and pain. Recently diagnosed with pancreatic cancer in [place]. No reported therapy. History of diabetes. EXAM: CT ABDOMEN AND PELVIS WITH CONTRAST TECHNIQUE: Multidetector CT imaging of the abdomen and pelvis was performed using the standard protocol following bolus administration of intravenous contrast. CONTRAST:  178m OMNIPAQUE IOHEXOL 300 MG/ML  SOLN COMPARISON:  None. FINDINGS: Lower chest: Mild linear atelectasis or scarring at both lung bases. There is a calcified granuloma in the lingula. No significant pleural or pericardial effusion. Hepatobiliary: There are multiple metastases throughout all segments of the liver which demonstrate early peripheral enhancement. Representative lesions on series 7 include a 3.2 cm lesion in the anterior dome of the right lobe on image 14, a 3.4 cm lesion in the far lateral segment of the left lobe on image 19, a 4.3 cm lesion in the left lobe on image 21 and a 2.1 cm lesion more inferiorly in the right lobe on image 40. no evidence of gallstones, gallbladder wall thickening or biliary dilatation. Pancreas: There is a very large, centrally necrotic mass in the left upper quadrant of the abdomen. This measures 12.2 x 9.0 x 8.6 cm and has thick irregular enhancing walls, central low-density and multiple central air bubbles. This mass is located between the liver, stomach and pancreas, and appears inseparable from the lesser curvature of the stomach. This mass also abuts and inferiorly displaces the pancreatic body and tail, but is not favored to represent a primary pancreatic process. There is no pancreatic ductal dilatation. Spleen: Normal in size without focal abnormality. Adrenals/Urinary Tract: Both adrenal glands appear normal. The kidneys appear normal without evidence of urinary tract calculus, suspicious  lesion or hydronephrosis. No bladder abnormalities are seen. Stomach/Bowel: As above, large centrally necrotic left upper quadrant mass along the lesser curvature of the stomach, favored to reflect a primary gastric malignancy (likely gastrointestinal stromal tumor). The air within this lesion may be secondary to central necrosis and/or communication with the gastric lumen. The small bowel, appendix and colon appear normal. There is moderate stool throughout the colon. No evidence of bowel obstruction or perforation. Vascular/Lymphatic: There are no enlarged abdominal or pelvic lymph nodes. There are multiple small lymph nodes in the gastrohepatic ligament and retroperitoneum which are not pathologically enlarged. Mild aortic and branch vessel atherosclerosis. No vascular encasement. A metastasis within the caudate lobe of the liver exerts mild mass effect on the IVC which remains patent. The portal, superior mesenteric, splenic and hepatic veins are patent. Reproductive: Mild enlargement of the prostate gland. Other: Intact anterior abdominal wall. No ascites or peritoneal nodularity. Musculoskeletal: No acute or significant osseous findings. Mild lumbar spondylosis. IMPRESSION: 1. 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 This mass is located between the liver, stomach and pancreas but is not favored to arise from the pancreas. Gas within the lesion may be secondary necrosis or communication with the gastric lumen. 2. Multiple hepatic metastases. Numerous prominent lymph nodes in the upper abdomen, not pathologically enlarged. 3. No evidence of bowel obstruction or perforation. 4. Aortic Atherosclerosis (ICD10-I70.0). Electronically Signed   By: WGwyndolyn Saxon  Lin Landsman M.D.   On: 08/25/2019 13:29   DG Chest Port 1 View  Result Date: 09/16/2019 CLINICAL DATA:  74 year old male with fever. EXAM: PORTABLE CHEST 1 VIEW  COMPARISON:  None. FINDINGS: The lungs are clear. There is no pleural effusion. Linear lucency along the peripheral right lung, likely artifactual and related to skin fold. A pneumothorax is less likely. A 4 mm left lung base calcified granuloma. The cardiac silhouette is within limits. Left hilar calcified gland normal noted. Right-sided Port-A-Cath with tip at the cavoatrial junction. No acute osseous pathology. IMPRESSION: 1. No focal consolidation. 2. Artifact versus less likely a right-sided pneumothorax. Electronically Signed   By: Anner Crete M.D.   On: 09/16/2019 21:27   IR IMAGING GUIDED PORT INSERTION  Result Date: 09/03/2019 INDICATION: Pancreatic cancer. In need of durable intravenous access for chemotherapy administration. EXAM: IMPLANTED PORT A CATH PLACEMENT WITH ULTRASOUND AND FLUOROSCOPIC GUIDANCE COMPARISON:  None. MEDICATIONS: Ancef 2 gm IV; The antibiotic was administered within an appropriate time interval prior to skin puncture. ANESTHESIA/SEDATION: Moderate (conscious) sedation was employed during this procedure. A total of Versed 2 mg and Fentanyl 100 mcg was administered intravenously. Moderate Sedation Time: 20 minutes. The patient's level of consciousness and vital signs were monitored continuously by radiology nursing throughout the procedure under my direct supervision. CONTRAST:  None FLUOROSCOPY TIME:  12 seconds (2 mGy) COMPLICATIONS: None immediate. PROCEDURE: The procedure, risks, benefits, and alternatives were explained to the patient. Questions regarding the procedure were encouraged and answered. The patient understands and consents to the procedure. The right neck and chest were prepped with chlorhexidine in a sterile fashion, and a sterile drape was applied covering the operative field. Maximum barrier sterile technique with sterile gowns and gloves were used for the procedure. A timeout was performed prior to the initiation of the procedure. Local anesthesia was  provided with 1% lidocaine with epinephrine. After creating a small venotomy incision, a micropuncture kit was utilized to access the internal jugular vein. Real-time ultrasound guidance was utilized for vascular access including the acquisition of a permanent ultrasound image documenting patency of the accessed vessel. The microwire was utilized to measure appropriate catheter length. A subcutaneous port pocket was then created along the upper chest wall utilizing a combination of sharp and blunt dissection. The pocket was irrigated with sterile saline. A single lumen "Slim" sized power injectable port was chosen for placement. The 8 Fr catheter was tunneled from the port pocket site to the venotomy incision. The port was placed in the pocket. The external catheter was trimmed to appropriate length. At the venotomy, an 8 Fr peel-away sheath was placed over a guidewire under fluoroscopic guidance. The catheter was then placed through the sheath and the sheath was removed. Final catheter positioning was confirmed and documented with a fluoroscopic spot radiograph. The port was accessed with a Huber needle, aspirated and flushed with heparinized saline. The venotomy site was closed with an interrupted 4-0 Vicryl suture. The port pocket incision was closed with interrupted 2-0 Vicryl suture. The skin was opposed with a running subcuticular 4-0 Vicryl suture. Dermabond and Steri-strips were applied to both incisions. Dressings were applied. The patient tolerated the procedure well without immediate post procedural complication. FINDINGS: After catheter placement, the tip lies within the superior cavoatrial junction. The catheter aspirates and flushes normally and is ready for immediate use. IMPRESSION: Successful placement of a right internal jugular approach power injectable Port-A-Cath. The catheter is ready for immediate use. Electronically Signed   By: Jenny Reichmann  Watts M.D.   On: 09/03/2019 13:05       Subjective: Patient seen and examined at the bedside this morning.  Hemodynamically stable  for discharge today.  Discharge Exam: Vitals:   09/18/19 2300 09/19/19 0527  BP:  129/78  Pulse:  80  Resp:  16  Temp:  98.2 F (36.8 C)  SpO2: 99% 100%   Vitals:   09/18/19 1624 09/18/19 2131 09/18/19 2300 09/19/19 0527  BP: 113/68 117/63  129/78  Pulse: 67 80  80  Resp: 20 16  16   Temp: 98.1 F (36.7 C) 98.3 F (36.8 C)  98.2 F (36.8 C)  TempSrc: Oral   Oral  SpO2: 100% 100% 99% 100%    General: Pt is alert, awake, not in acute distress Cardiovascular: RRR, S1/S2 +, no rubs, no gallops Respiratory: CTA bilaterally, no wheezing, no rhonchi Abdominal: Soft, NT, ND, bowel sounds + Extremities: no edema, no cyanosis    The results of significant diagnostics from this hospitalization (including imaging, microbiology, ancillary and laboratory) are listed below for reference.     Microbiology: Recent Results (from the past 240 hour(s))  Blood Culture (routine x 2)     Status: None (Preliminary result)   Collection Time: 09/16/19  9:01 PM   Specimen: BLOOD  Result Value Ref Range Status   Specimen Description   Final    BLOOD RIGHT ANTECUBITAL Performed at [facility] [address], [place], [place] [address]    Special Requests   Final    BOTTLES DRAWN AEROBIC AND ANAEROBIC Blood Culture results may not be optimal due to an excessive volume of blood received in culture bottles Performed at [facility] [address], [place], [place] [address]    Culture   Final    NO GROWTH 2 DAYS Performed at [facility] [address], [place], [place] [address]    Report Status PENDING  Incomplete  Blood Culture (routine x 2)     Status: None (Preliminary result)   Collection Time: 09/16/19  9:27 PM   Specimen: BLOOD  Result Value Ref Range Status   Specimen Description   Final    BLOOD PORTA CATH Performed at [facility] [address], [place], [place] [address]    Special Requests   Final    BOTTLES DRAWN AEROBIC AND ANAEROBIC Blood Culture adequate volume Performed at [facility] [address] [place], [place] [address]    Culture   Final    NO GROWTH 2 DAYS Performed at [facility] [address], [place], [place] [address]    Report Status PENDING  Incomplete  Respiratory Panel by RT PCR (Flu A&B, Covid) - Nasopharyngeal Swab     Status: None   Collection Time: 09/16/19  9:27 PM   Specimen: Nasopharyngeal Swab  Result Value Ref Range Status   SARS Coronavirus 2 by RT PCR NEGATIVE NEGATIVE Final    Comment: (NOTE) SARS-CoV-2 target nucleic acids are NOT DETECTED. The SARS-CoV-2 RNA is generally detectable in upper respiratoy specimens during the acute phase of infection. The lowest concentration of SARS-CoV-2 viral copies this assay can detect is 131 copies/mL. A negative result does not preclude SARS-Cov-2 infection and should not be used as the sole basis for treatment or other patient management decisions. A negative result may occur with  improper specimen collection/handling, submission of specimen other than nasopharyngeal swab, presence of viral mutation(s) within the areas targeted  by this assay, and inadequate number of viral copies (<131 copies/mL). A negative result must be combined with clinical observations, patient history, and epidemiological information. The expected result is Negative. Fact Sheet for Patients:  PinkCheek.be Fact Sheet for Healthcare Providers:  GravelBags.it This test is not yet ap proved or cleared by the [place] FDA and  has been authorized for detection and/or diagnosis of SARS-CoV-2 by FDA under an Emergency Use Authorization (EUA). This EUA will remain  in effect (meaning this test can be used) for the duration of the COVID-19 declaration  under Section 564(b)(1) of the Act, 21 U.S.C. section 360bbb-3(b)(1), unless the authorization is terminated or revoked sooner.    Influenza A by PCR NEGATIVE NEGATIVE Final   Influenza B by PCR NEGATIVE NEGATIVE Final    Comment: (NOTE) The Xpert Xpress SARS-CoV-2/FLU/RSV assay is intended as an aid in  the diagnosis of influenza from Nasopharyngeal swab specimens and  should not be used as a sole basis for treatment. Nasal washings and  aspirates are unacceptable for Xpert Xpress SARS-CoV-2/FLU/RSV  testing. Fact Sheet for Patients: PinkCheek.be Fact Sheet for Healthcare Providers: GravelBags.it This test is not yet approved or cleared by the [place] FDA and  has been authorized for detection and/or diagnosis of SARS-CoV-2 by  FDA under an Emergency Use Authorization (EUA). This EUA will remain  in effect (meaning this test can be used) for the duration of the  Covid-19 declaration under Section 564(b)(1) of the Act, 21  U.S.C. section 360bbb-3(b)(1), unless the authorization is  terminated or revoked. Performed at [facility], [place] [address], [place], [place] [address]   Urine culture     Status: None   Collection Time: 09/16/19 11:53 PM   Specimen: In/Out Cath Urine  Result Value Ref Range Status   Specimen Description   Final    IN/OUT CATH URINE Performed at [facility] [address], [place], [place] [address]    Special Requests   Final    NONE Performed at [facility], [place] [address] [place],  [address]    Culture   Final    NO GROWTH Performed at [facility], [place] [address], [place],  [address]    Report Status 09/17/2019 FINAL  Final     Labs: BNP (last 3 results) No results for input(s): BNP in the last 8760 hours. Basic Metabolic Panel: Recent Labs  Lab 09/12/19 1443 09/16/19 2101 09/18/19 0500  09/19/19 0344  NA 131* 132* 131* 135  K 4.4 3.6 3.4* 3.6  CL 96* 100 99 103  CO2 23 23 23 24   GLUCOSE 143* 139* 88 97  BUN 11 5* <5* 7*  CREATININE 0.66 0.56* 0.54* 0.54*  CALCIUM 8.8* 8.3* 8.2* 8.5*   Liver Function Tests: Recent Labs  Lab 09/12/19 1443 09/16/19 2101  AST 36 45*  ALT 11 19  ALKPHOS 142* 112  BILITOT 0.7 0.6  PROT 6.9 6.2*  ALBUMIN 2.8* 2.8*   No results for input(s): LIPASE, AMYLASE in the last 168 hours. No results for input(s): AMMONIA in the last 168 hours. CBC: Recent Labs  Lab 09/12/19 1443 09/16/19 2101 09/17/19 0600 09/18/19 0500 09/19/19 0344  WBC 7.3 1.4* 2.6* 2.6* 2.7*  NEUTROABS 5.4 0.5* 0.6* 0.5* 0.4*  HGB 11.5* 10.1* 9.3* 9.7* 10.1*  HCT 34.0* 29.7* 28.4* 29.7* 31.2*  MCV 81.1 81.1 83.0 81.6 81.9  PLT 346 302 262 335 411*   Cardiac Enzymes: No  results for input(s): CKTOTAL, CKMB, CKMBINDEX, TROPONINI in the last 168 hours. BNP: Invalid input(s): POCBNP CBG: Recent Labs  Lab 09/18/19 0800 09/18/19 1124 09/18/19 1621 09/18/19 2133 09/19/19 0733  GLUCAP 117* 168* 133* 114* 80   D-Dimer No results for input(s): DDIMER in the last 72 hours. Hgb A1c Recent Labs    09/17/19 0600  HGBA1C 7.7*   Lipid Profile No results for input(s): CHOL, HDL, LDLCALC, TRIG, CHOLHDL, LDLDIRECT in the last 72 hours. Thyroid function studies No results for input(s): TSH, T4TOTAL, T3FREE, THYROIDAB in the last 72 hours.  Invalid input(s): FREET3 Anemia work up Recent Labs    09/17/19 0600  VITAMINB12 3,770*  FOLATE 22.1  FERRITIN 350*  TIBC 144*  IRON 10*  RETICCTPCT 1.7   Urinalysis    Component Value Date/Time   COLORURINE YELLOW 09/16/2019 2353   APPEARANCEUR CLEAR 09/16/2019 2353   LABSPEC 1.008 09/16/2019 2353   PHURINE 6.0 09/16/2019 2353   GLUCOSEU NEGATIVE 09/16/2019 2353   HGBUR NEGATIVE 09/16/2019 2353   BILIRUBINUR NEGATIVE 09/16/2019 2353   KETONESUR NEGATIVE 09/16/2019 2353   PROTEINUR NEGATIVE 09/16/2019 2353    NITRITE NEGATIVE 09/16/2019 2353   LEUKOCYTESUR NEGATIVE 09/16/2019 2353   Sepsis Labs Invalid input(s): PROCALCITONIN,  WBC,  LACTICIDVEN Microbiology Recent Results (from the past 240 hour(s))  Blood Culture (routine x 2)     Status: None (Preliminary result)   Collection Time: 09/16/19  9:01 PM   Specimen: BLOOD  Result Value Ref Range Status   Specimen Description   Final    BLOOD RIGHT ANTECUBITAL Performed at [facility], [place] [address], [place], [place] [address]    Special Requests   Final    BOTTLES DRAWN AEROBIC AND ANAEROBIC Blood Culture results may not be optimal due to an excessive volume of blood received in culture bottles Performed at [facility] [address] [place], [place] [address]    Culture   Final    NO GROWTH 2 DAYS Performed at [facility] [address] [place], [place] [address]    Report Status PENDING  Incomplete  Blood Culture (routine x 2)     Status: None (Preliminary result)   Collection Time: 09/16/19  9:27 PM   Specimen: BLOOD  Result Value Ref Range Status   Specimen Description   Final    BLOOD PORTA CATH Performed at [facility] [address], [place], [place] [address]    Special Requests   Final    BOTTLES DRAWN AEROBIC AND ANAEROBIC Blood Culture adequate volume Performed at [facility] [address] [place], [place] [address]    Culture   Final    NO GROWTH 2 DAYS Performed at [facility] [address] [place], [place] [address]    Report Status PENDING  Incomplete  Respiratory Panel by RT PCR (Flu A&B, Covid) - Nasopharyngeal Swab     Status: None   Collection Time: 09/16/19  9:27 PM   Specimen: Nasopharyngeal Swab  Result Value Ref Range Status   SARS Coronavirus 2 by RT PCR NEGATIVE NEGATIVE Final    Comment: (NOTE) SARS-CoV-2 target nucleic acids are NOT DETECTED. The SARS-CoV-2 RNA is generally detectable in  upper respiratoy specimens during the acute phase of infection. The lowest concentration of SARS-CoV-2 viral copies this assay can detect is 131 copies/mL. A negative result does not preclude SARS-Cov-2 infection and should not be used as the sole basis for  treatment or other patient management decisions. A negative result may occur with  improper specimen collection/handling, submission of specimen other than nasopharyngeal swab, presence of viral mutation(s) within the areas targeted by this assay, and inadequate number of viral copies (<131 copies/mL). A negative result must be combined with clinical observations, patient history, and epidemiological information. The expected result is Negative. Fact Sheet for Patients:  PinkCheek.be Fact Sheet for Healthcare Providers:  GravelBags.it This test is not yet ap proved or cleared by the [place] FDA and  has been authorized for detection and/or diagnosis of SARS-CoV-2 by FDA under an Emergency Use Authorization (EUA). This EUA will remain  in effect (meaning this test can be used) for the duration of the COVID-19 declaration under Section 564(b)(1) of the Act, 21 U.S.C. section 360bbb-3(b)(1), unless the authorization is terminated or revoked sooner.    Influenza A by PCR NEGATIVE NEGATIVE Final   Influenza B by PCR NEGATIVE NEGATIVE Final    Comment: (NOTE) The Xpert Xpress SARS-CoV-2/FLU/RSV assay is intended as an aid in  the diagnosis of influenza from Nasopharyngeal swab specimens and  should not be used as a sole basis for treatment. Nasal washings and  aspirates are unacceptable for Xpert Xpress SARS-CoV-2/FLU/RSV  testing. Fact Sheet for Patients: PinkCheek.be Fact Sheet for Healthcare Providers: GravelBags.it This test is not yet approved or cleared by the [place] FDA and  has been authorized for  detection and/or diagnosis of SARS-CoV-2 by  FDA under an Emergency Use Authorization (EUA). This EUA will remain  in effect (meaning this test can be used) for the duration of the  Covid-19 declaration under Section 564(b)(1) of the Act, 21  U.S.C. section 360bbb-3(b)(1), unless the authorization is  terminated or revoked. Performed at [facility], [place] [address] [place], [place] [address]   Urine culture     Status: None   Collection Time: 09/16/19 11:53 PM   Specimen: In/Out Cath Urine  Result Value Ref Range Status   Specimen Description   Final    IN/OUT CATH URINE Performed at [facility] [address] [place], [place] [address]    Special Requests   Final    NONE Performed at [facility], [place] [address], [place], [place] [address]    Culture   Final    NO GROWTH Performed at [facility], [place] [address], [place], [place] [address]    Report Status 09/17/2019 FINAL  Final    Please note: You were cared for by a hospitalist during your hospital stay. Once you are discharged, your primary care physician will handle any further medical issues. Please note that NO REFILLS for any discharge medications will be authorized once you are discharged, as it is imperative that you return to your primary care physician (or establish a relationship with a primary care physician if you do not have one) for your post hospital discharge needs so that they can reassess your need for medications and monitor your lab values.    Time coordinating discharge: 40 minutes  SIGNED:   Shelly Coss, MD  [facility] 09/19/2019, 11:24 AM Pager [phone]  If 7PM-7AM, please contact night-coverage [email or web address] Password [email or web address]

## 2019-09-22 ENCOUNTER — Inpatient Hospital Stay: Payer: Self-pay | Admitting: Oncology

## 2019-09-22 ENCOUNTER — Inpatient Hospital Stay: Payer: Self-pay

## 2019-09-22 ENCOUNTER — Other Ambulatory Visit: Payer: Self-pay

## 2019-09-22 DIAGNOSIS — D709 Neutropenia, unspecified: Secondary | ICD-10-CM

## 2019-09-22 DIAGNOSIS — R5081 Fever presenting with conditions classified elsewhere: Secondary | ICD-10-CM

## 2019-09-22 LAB — CBC WITH DIFFERENTIAL (CANCER CENTER ONLY)
Abs Immature Granulocytes: 0.49 10*3/uL — ABNORMAL HIGH (ref 0.00–0.07)
Basophils Absolute: 0.1 10*3/uL (ref 0.0–0.1)
Basophils Relative: 1 %
Eosinophils Absolute: 0.1 10*3/uL (ref 0.0–0.5)
Eosinophils Relative: 1 %
HCT: 33.5 % — ABNORMAL LOW (ref 39.0–52.0)
Hemoglobin: 10.9 g/dL — ABNORMAL LOW (ref 13.0–17.0)
Immature Granulocytes: 5 %
Lymphocytes Relative: 17 %
Lymphs Abs: 1.7 10*3/uL (ref 0.7–4.0)
MCH: 26.8 pg (ref 26.0–34.0)
MCHC: 32.5 g/dL (ref 30.0–36.0)
MCV: 82.5 fL (ref 80.0–100.0)
Monocytes Absolute: 0.9 10*3/uL (ref 0.1–1.0)
Monocytes Relative: 9 %
Neutro Abs: 6.7 10*3/uL (ref 1.7–7.7)
Neutrophils Relative %: 67 %
Platelet Count: 524 10*3/uL — ABNORMAL HIGH (ref 150–400)
RBC: 4.06 MIL/uL — ABNORMAL LOW (ref 4.22–5.81)
RDW: 14.8 % (ref 11.5–15.5)
WBC Count: 10 10*3/uL (ref 4.0–10.5)
nRBC: 0 % (ref 0.0–0.2)

## 2019-09-22 LAB — CULTURE, BLOOD (ROUTINE X 2)
Culture: NO GROWTH
Culture: NO GROWTH
Special Requests: ADEQUATE

## 2019-09-25 ENCOUNTER — Inpatient Hospital Stay: Payer: Self-pay

## 2019-09-25 ENCOUNTER — Other Ambulatory Visit: Payer: Self-pay

## 2019-09-25 ENCOUNTER — Inpatient Hospital Stay (HOSPITAL_BASED_OUTPATIENT_CLINIC_OR_DEPARTMENT_OTHER): Payer: Self-pay | Admitting: Oncology

## 2019-09-25 ENCOUNTER — Telehealth: Payer: Self-pay | Admitting: Oncology

## 2019-09-25 VITALS — BP 101/66 | HR 70

## 2019-09-25 VITALS — BP 84/62 | HR 109 | Temp 98.7°F | Resp 16 | Ht 68.5 in | Wt 113.2 lb

## 2019-09-25 DIAGNOSIS — C160 Malignant neoplasm of cardia: Secondary | ICD-10-CM

## 2019-09-25 DIAGNOSIS — D709 Neutropenia, unspecified: Secondary | ICD-10-CM

## 2019-09-25 LAB — CBC WITH DIFFERENTIAL (CANCER CENTER ONLY)
Abs Immature Granulocytes: 0.44 10*3/uL — ABNORMAL HIGH (ref 0.00–0.07)
Basophils Absolute: 0.1 10*3/uL (ref 0.0–0.1)
Basophils Relative: 1 %
Eosinophils Absolute: 0.1 10*3/uL (ref 0.0–0.5)
Eosinophils Relative: 1 %
HCT: 32.5 % — ABNORMAL LOW (ref 39.0–52.0)
Hemoglobin: 10.5 g/dL — ABNORMAL LOW (ref 13.0–17.0)
Immature Granulocytes: 5 %
Lymphocytes Relative: 17 %
Lymphs Abs: 1.6 10*3/uL (ref 0.7–4.0)
MCH: 26.9 pg (ref 26.0–34.0)
MCHC: 32.3 g/dL (ref 30.0–36.0)
MCV: 83.1 fL (ref 80.0–100.0)
Monocytes Absolute: 0.7 10*3/uL (ref 0.1–1.0)
Monocytes Relative: 7 %
Neutro Abs: 7 10*3/uL (ref 1.7–7.7)
Neutrophils Relative %: 69 %
Platelet Count: 615 10*3/uL — ABNORMAL HIGH (ref 150–400)
RBC: 3.91 MIL/uL — ABNORMAL LOW (ref 4.22–5.81)
RDW: 15.8 % — ABNORMAL HIGH (ref 11.5–15.5)
WBC Count: 9.9 10*3/uL (ref 4.0–10.5)
nRBC: 0 % (ref 0.0–0.2)

## 2019-09-25 LAB — CMP (CANCER CENTER ONLY)
ALT: 11 U/L (ref 0–44)
AST: 54 U/L — ABNORMAL HIGH (ref 15–41)
Albumin: 2.8 g/dL — ABNORMAL LOW (ref 3.5–5.0)
Alkaline Phosphatase: 148 U/L — ABNORMAL HIGH (ref 38–126)
Anion gap: 10 (ref 5–15)
BUN: 10 mg/dL (ref 8–23)
CO2: 25 mmol/L (ref 22–32)
Calcium: 9.3 mg/dL (ref 8.9–10.3)
Chloride: 97 mmol/L — ABNORMAL LOW (ref 98–111)
Creatinine: 0.62 mg/dL (ref 0.61–1.24)
GFR, Est AFR Am: >60 mL/min (ref >60)
GFR, Estimated: >60 mL/min (ref >60)
Glucose, Bld: 103 mg/dL — ABNORMAL HIGH (ref 70–99)
Potassium: 4.4 mmol/L (ref 3.5–5.1)
Sodium: 132 mmol/L — ABNORMAL LOW (ref 135–145)
Total Bilirubin: 0.4 mg/dL (ref 0.3–1.2)
Total Protein: 6.9 g/dL (ref 6.5–8.1)

## 2019-09-25 MED ORDER — SODIUM CHLORIDE 0.9 % IV SOLN
INTRAVENOUS | Status: DC
  Filled 2019-09-25 (×2): qty 250

## 2019-09-25 MED ORDER — SODIUM CHLORIDE 0.9 % IV SOLN
1680.0000 mg/m2 | INTRAVENOUS | Status: DC
  Administered 2019-09-25: 2650 mg via INTRAVENOUS
  Filled 2019-09-25: qty 53

## 2019-09-25 MED ORDER — PALONOSETRON HCL INJECTION 0.25 MG/5ML
INTRAVENOUS | Status: AC
  Filled 2019-09-25: qty 5

## 2019-09-25 MED ORDER — SODIUM CHLORIDE 0.9 % IV SOLN
240.0000 mg | Freq: Once | INTRAVENOUS | Status: AC
  Administered 2019-09-25: 240 mg via INTRAVENOUS
  Filled 2019-09-25: qty 24

## 2019-09-25 MED ORDER — PALONOSETRON HCL INJECTION 0.25 MG/5ML
0.2500 mg | Freq: Once | INTRAVENOUS | Status: AC
  Administered 2019-09-25: 0.25 mg via INTRAVENOUS

## 2019-09-25 MED ORDER — DEXTROSE 5 % IV SOLN
Freq: Once | INTRAVENOUS | Status: AC
  Filled 2019-09-25: qty 250

## 2019-09-25 MED ORDER — SODIUM CHLORIDE 0.9% FLUSH
10.0000 mL | Freq: Once | INTRAVENOUS | Status: AC
  Administered 2019-09-25: 10 mL
  Filled 2019-09-25: qty 10

## 2019-09-25 MED ORDER — SODIUM CHLORIDE 0.9 % IV SOLN
10.0000 mg | Freq: Once | INTRAVENOUS | Status: AC
  Administered 2019-09-25: 10 mg via INTRAVENOUS
  Filled 2019-09-25: qty 10

## 2019-09-25 MED ORDER — LEUCOVORIN CALCIUM INJECTION 350 MG
400.0000 mg/m2 | Freq: Once | INTRAVENOUS | Status: AC
  Administered 2019-09-25: 628 mg via INTRAVENOUS
  Filled 2019-09-25: qty 31.4

## 2019-09-25 MED ORDER — OXALIPLATIN CHEMO INJECTION 100 MG/20ML
85.0000 mg/m2 | Freq: Once | INTRAVENOUS | Status: AC
  Administered 2019-09-25: 135 mg via INTRAVENOUS
  Filled 2019-09-25: qty 27

## 2019-09-25 NOTE — Progress Notes (Signed)
Okay to treat with VS per Dr. Benay Spice with add 500 NS ober 1 hour and recheck VS

## 2019-09-25 NOTE — Patient Instructions (Signed)
To moisturize skin, try Curel, Aquaphor,or Cetaphil--these are over-the-counter For diarrhea: Take Imodium 2 mg--1-2 tablets four times daily if needed (also comes in a liquid)

## 2019-09-25 NOTE — Patient Instructions (Signed)
[facility] Discharge Instructions for Patients Receiving Chemotherapy  Today you received the following chemotherapy agents Oxaliplatin (ELOXATIN), Leucovorin & Flourouracil (ADRUCIL).  To help prevent nausea and vomiting after your treatment, we encourage you to take your nausea medication as prescribed.   If you develop nausea and vomiting that is not controlled by your nausea medication, call the clinic.   BELOW ARE SYMPTOMS THAT SHOULD BE REPORTED IMMEDIATELY:  *FEVER GREATER THAN 100.5 F  *CHILLS WITH OR WITHOUT FEVER  NAUSEA AND VOMITING THAT IS NOT CONTROLLED WITH YOUR NAUSEA MEDICATION  *UNUSUAL SHORTNESS OF BREATH  *UNUSUAL BRUISING OR BLEEDING  TENDERNESS IN MOUTH AND THROAT WITH OR WITHOUT PRESENCE OF ULCERS  *URINARY PROBLEMS  *BOWEL PROBLEMS  UNUSUAL RASH Items with * indicate a potential emergency and should be followed up as soon as possible.  Feel free to call the clinic should you have any questions or concerns. The clinic phone number is [phone].  Please show the [place] at check-in to the Emergency Department and triage nurse.   Oxaliplatin Injection Qu es este medicamento? El OXALIPLATINO es un agente quimioteraputico. Este medicamento acta sobre las clulas que se dividen rpidamente, como las clulas cancerosas, y finalmente provoca la muerte de estas clulas. Se utiliza en el tratamiento del cncer de colon y recto y 60 tipos de cncer. Este medicamento puede ser utilizado para otros usos; si tiene alguna pregunta consulte con su proveedor de atencin mdica o con su farmacutico. MARCAS COMUNES: Eloxatin Qu le debo informar a mi profesional de la salud antes de tomar este medicamento? Necesita saber si usted presenta alguno de los siguientes problemas o situaciones:  enfermedad renal  una reaccin alrgica o inusual al oxaliplatino, a otros agentes quimioteraputicos, a otros medicamentos, alimentos,  colorantes o conservantes  si est embarazada o buscando quedar embarazada  si est amamantando a un beb Cmo debo utilizar este medicamento? Este medicamento se administra mediante infusin por va intravenosa. Lo administra un profesional de la salud calificado en un hospital o en un entorno clnico. Hable con su pediatra para informarse acerca del uso de este medicamento en nios. Puede requerir atencin especial. Sobredosis: Pngase en contacto inmediatamente con un centro toxicolgico o una sala de urgencia si usted cree que haya tomado demasiado medicamento. ATENCIN: [employer] es solo para usted. No comparta este medicamento con nadie. Qu sucede si me olvido de una dosis? Es importante no olvidar ninguna dosis. Informe a su mdico o a su profesional de la salud si no puede asistir a Photographer. Qu puede interactuar con este medicamento?  medicamentos para incrementar los conteos sanguneos, tales como filgrastim, pegfilgrastim, sargramostim  probenecid  ciertos antibiticos, tales como amicacina, gentamicina, neomicina, polimixina B, estreptomicina, tobramicina  zalcitabina Consulte a su mdico o a su profesional de la salud antes de tomar cualquiera de los siguientes medicamentos:  acetaminofeno  aspirina  ibuprofeno  quetoprofeno  naproxeno Puede ser que esta lista no menciona todas las posibles interacciones. Informe a su profesional de KB Home	[place] de [employer] productos a base de hierbas, medicamentos de [place] o suplementos nutritivos que est tomando. Si usted fuma, consume bebidas alcohlicas o si utiliza drogas ilegales, indqueselo tambin a su profesional de KB Home	[place]. Algunas sustancias pueden interactuar con su medicamento. A qu debo estar atento al usar [employer]? Se supervisar su condicin atentamente mientras reciba este medicamento. Tendr que hacerse anlisis de sangre peridicos mientras reciba este medicamento. Este medicamento puede  aumentar su sensibilidad  al fro. No tomar bebidas fras o usar hielo. Cubra la piel expuesta antes de estar en contacto con temperaturas fras u objetos fros. Mientras se encuentra afuera cuando haga fro use ropa [place] su boca y su nariz para calentar el aire que entra en sus pulmones. Informe a su mdico si experimenta sensibilidad al fro. Este medicamento puede hacerle sentir un Nurse, mental health. Esto es normal ya que la quimioterapia afecta tanto a las clulas sanas como a las clulas cancerosas. Si presenta alguno de los [employer], infrmelos. Sin embargo, contine con el tratamiento aun si se siente enfermo, a menos que su mdico le indique que lo suspenda. En algunos casos, podr recibir [employer] para ayudarle con los efectos secundarios. Siga las instrucciones para usarlos. Consulte a su mdico o a su profesional de la salud por asesoramiento si tiene fiebre, escalofros, dolor de garganta o cualquier otro sntoma de resfro o gripe. No se trate usted mismo. Este medicamento puede reducir la capacidad del cuerpo para combatir infecciones. Trate de no acercarse a personas que estn enfermas. [employer] puede aumentar el riesgo de magulladuras o sangrado. Consulte a su mdico o a su profesional de la salud si observa sangrados inusuales. Proceda con cuidado al cepillar sus dientes, usar hilo dental o Risk manager palillos para los dientes, ya que puede contraer una infeccin o Therapist, art con mayor facilidad. Si se somete a algn tratamiento dental, informe a su dentista que est [employer]. Evite tomar productos que contienen aspirina, acetaminofeno, ibuprofeno, naproxeno o quetoprofeno a menos que as lo indique su mdico. Estos productos pueden disimular la fiebre. No se debe quedar embarazada mientras reciba este medicamento. Las mujeres deben informar a su mdico si estn buscando quedar embarazadas o si creen que estn embarazadas. Existe la  posibilidad de efectos secundarios graves a un beb sin nacer. Para ms informacin hable con su profesional de la salud o su farmacutico. No debe Economist a un beb mientras reciba este medicamento. Si tiene diarrea, llame a su mdico o a su profesional de KB Home	[place]. No se trate usted mismo. Qu efectos secundarios puedo tener al [employer] este medicamento? Efectos secundarios que debe informar a su mdico o a Barrister's clerk de la salud tan pronto como sea posible:  Chief of Staff como erupcin cutnea, picazn o urticarias, hinchazn de la cara, labios o lengua  conteos sanguneos bajos - este medicamento puede reducir la cantidad de glbulos blancos, glbulos rojos y plaquetas. Su riesgo de infeccin y [place].  signos de infeccin - fiebre o escalofros, tos, dolor de garganta, Social research officer, government o dificultad para orinar  signos de reduccin de plaquetas o sangrado - magulladuras, puntos rojos en la piel, heces de color oscuro o con aspecto alquitranado, sangrado por la nariz  signos de reduccin de glbulos rojos - cansancio o debilidad inusual, desmayos, sensacin de Lobbyist u opresin en el pecho  tos  diarrea  tensin en la mandbula  llagas en la boca  nuseas, vmito  dolor, hinchazn, enrojecimiento o irritacin en el lugar de la inyeccin  dolor, hormigueo, entumecimiento de manos o pies  problemas de coordinacin, del habla, al caminar  enrojecimiento, formacin de ampollas, descamacin o distensin de la piel, inclusive dentro de la boca  dificultad para orinar o cambios en el volumen de orina Efectos secundarios que, por lo general, no requieren atencin mdica (debe informarlos a su mdico o a su profesional de la salud si persisten o si  son molestos):  cambios en la visin  estreimiento  cada del cabello  prdida del apetito  sabor metlico en la boca o cambios en el sentido del gusto  dolor estomacal Puede  ser que esta lista no menciona todos los posibles efectos secundarios. Comunquese a su mdico por asesoramiento mdico [employer]. Usted puede informar los efectos secundarios a la FDA por telfono al 1-800-FDA-1088. Dnde debo guardar mi medicina? Este medicamento se administra en hospitales o clnicas y no necesitar guardarlo en su domicilio. ATENCIN: Este folleto es un resumen. Puede ser que no cubra toda la posible informacin. Si usted tiene preguntas acerca de esta medicina, consulte con su mdico, su farmacutico o su profesional de Technical sales engineer.  2020 Elsevier/Gold Standard (2014-06-23 00:00:00)   Leucovorin injection Qu es este medicamento? La LEUCOVORINA se [place] para prevenir o tratar [employer] nocivos de ciertos medicamentos. Este medicamento tambin sirve para tratar la anemia provocada por una nivel bajo de cido flico en el cuerpo. Tambin se puede administrar con 5-fluorouracilo (5-FU), para tratar el cncer de colon. Este medicamento puede ser utilizado para otros usos; si tiene alguna pregunta consulte con su proveedor de atencin mdica o con su farmacutico. Qu le debo informar a mi profesional de la salud antes de tomar este medicamento? Necesita saber si usted presenta alguno de los siguientes problemas o situaciones:  anemia debido a bajos niveles de vitamina B-12 en la sangre  una reaccin alrgica o inusual a la leucovorina, cido flico, a otros medicamentos, alimentos, colorantes o conservantes  si est embarazada o buscando quedar embarazada  si est amamantando a un beb Cmo debo utilizar este medicamento? Este medicamento se administra mediante inyeccin por va intramuscular o intravenosa. Lo administra un profesional de Technical sales engineer en un hospital o en un entorno clnico. Hable con su pediatra para informarse acerca del uso de este medicamento en nios. Puede requerir atencin especial. Sobredosis: Pngase en contacto inmediatamente  con un centro toxicolgico o una sala de urgencia si usted cree que haya tomado demasiado medicamento. ATENCIN: [employer] es solo para usted. No comparta este medicamento con nadie. Qu sucede si me olvido de una dosis? No se aplica en este caso. Qu puede interactuar con este medicamento?  capecitabina  fluorouracilo  fenobarbital  fenitona  primidona  trimetoprima- sulfametoxasol Puede ser que esta lista no menciona todas las posibles interacciones. Informe a su profesional de KB Home	[place] de [employer] productos a base de hierbas, medicamentos de [place] o suplementos nutritivos que est tomando. Si usted fuma, consume bebidas alcohlicas o si utiliza drogas ilegales, indqueselo tambin a su profesional de KB Home	[place]. Algunas sustancias pueden interactuar con su medicamento. A qu debo estar atento al usar [employer]? Se supervisar su estado de salud atentamente mientras reciba este medicamento. Este medicamento puede aumentar los efectos secundarios de 5-fluorouracilo, 5-FU. Si tiene diarrea o [employer] boca que no mejoran o que [place], consulte a su mdico o a su profesional de KB Home	[place]. Qu efectos secundarios puedo tener al [employer] este medicamento? Efectos secundarios que debe informar a su mdico o a Barrister's clerk de la salud tan pronto como sea posible:  Chief of Staff como erupcin cutnea, picazn o urticarias, hinchazn de la cara, labios o lengua  problemas respiratorios  fiebre, infeccin  llagas en la boca  sangrado, magulladuras inusuales  cansancio o debilidad inusual Efectos secundarios que, por lo general, no requieren atencin mdica (debe informarlos a su mdico o a Barrister's clerk de  la salud si persisten o si son molestos):  estreimiento o diarrea  prdida del apetito  nuseas, vmito Puede ser que esta lista no menciona todos los posibles efectos secundarios. Comunquese a su mdico por asesoramiento mdico [employer]. Usted puede informar los efectos secundarios a la FDA por telfono al 1-800-FDA-1088. Dnde debo guardar mi medicina? Este medicamento se administra en hospitales o clnicas y no necesitar guardarlo en su domicilio. ATENCIN: Este folleto es un resumen. Puede ser que no cubra toda la posible informacin. Si usted tiene preguntas acerca de esta medicina, consulte con su mdico, su farmacutico o su profesional de Technical sales engineer.  2020 Elsevier/Gold Standard (2014-06-23 00:00:00)  Fluorouracil, 5-FU injection Qu es este medicamento? El [place], 5-FU es un agente quimioteraputico. Este medicamento reduce el crecimiento de las clulas cancerosas. Se utiliza en el tratamiento de muchos tipos de cncer, incluyendo el cncer de mama, de colon y recto, pancretico y de Paramedic. Este medicamento puede ser utilizado para otros usos; si tiene alguna pregunta consulte con su proveedor de atencin mdica o con su farmacutico. MARCAS COMUNES: [place] Qu le debo informar a mi profesional de la salud antes de tomar este medicamento? Necesita saber si usted presenta alguno de los siguientes problemas o situaciones:  trastornos sanguneos  deficiencia de la dihidropirimidina deshidrogenasa (DPD)  infeccin (especialmente infecciones virales, como varicela o herpes)  enfermedad renal  enfermedad heptica  desnutrido, malnutricin  radioterapia reciente o continuada  una reaccin alrgica o inusual al fluorouracilo, a otros agentes quimioteraputicos, otros medicamentos, alimentos, colorantes o conservantes  si est embarazada o buscando quedar embarazada  si est amamantando a un beb Cmo debo utilizar este medicamento? Este medicamento se administra mediante inyeccin o infusin por va intravenosa. Lo administra un profesional de la salud calificado en un hospital o en un entorno clnico. Hable con su pediatra para informarse acerca del uso de este medicamento en  nios. Puede requerir atencin especial. Sobredosis: Pngase en contacto inmediatamente con un centro toxicolgico o una sala de urgencia si usted cree que haya tomado demasiado medicamento. ATENCIN: [employer] es solo para usted. No comparta este medicamento con nadie. Qu sucede si me olvido de una dosis? Es importante no olvidar ninguna dosis. Informe a su mdico o a su profesional de la salud si no puede asistir a Photographer. Qu puede interactuar con este medicamento?  alopurinol  cimetidina  dapsona  digoxina  hidroxiurea  leucovorina  levamisol  medicamentos para convulsiones, tales como etotona, fosfenitona, fenitona  medicamentos para incrementar los conteos sanguneos, tales como filgrastim, pegfilgrastim, sargramostim  medicamentos que tratan o previenen cogulos sanguneos, tales como warfarina, enoxaparina y dalteparina  metotrexato  metronidazol  pirimetamina  otros agentes quimioteraputicos, tales como busulfn, cisplatino, estramustina, vinblastina  trimetoprima  trimetrexato  vacunas Consulte a su mdico o a su profesional de la salud antes de tomar cualquiera de los siguientes medicamentos:  acetaminofeno  aspirina  ibuprofeno  quetoprofeno  naproxeno Puede ser que esta lista no menciona todas las posibles interacciones. Informe a su profesional de KB Home	[place] de [employer] productos a base de hierbas, medicamentos de [place] o suplementos nutritivos que est tomando. Si usted fuma, consume bebidas alcohlicas o si utiliza drogas ilegales, indqueselo tambin a su profesional de KB Home	[place]. Algunas sustancias pueden interactuar con su medicamento. A qu debo estar atento al usar [employer]? Visite a su mdico para chequear su evolucin peridicamente. Este medicamento puede hacerle sentir un Nurse, mental health. Esto es normal ya que  la quimioterapia afecta tanto a las clulas sanas como a las clulas cancerosas. Si presenta alguno  de los [employer], infrmelos. Sin embargo, contine con el tratamiento aun si se siente enfermo, a menos que su mdico le indique que lo suspenda. En algunos casos, podr recibir [employer] para ayudarle con los efectos secundarios. Siga las instrucciones para usarlos. Consulte a su mdico o a su profesional de la salud por asesoramiento si tiene fiebre, escalofros, dolor de garganta o cualquier otro sntoma de resfro o gripe. No se trate usted mismo. Este medicamento puede reducir la capacidad del cuerpo para combatir infecciones. Trate de no acercarse a personas que estn enfermas. [employer] puede aumentar el riesgo de magulladuras o sangrado. Consulte a su mdico o a su profesional de la salud si observa sangrados inusuales. Proceda con cuidado al cepillar sus dientes, usar hilo dental o Risk manager palillos para los dientes, ya que puede contraer una infeccin o Therapist, art con mayor facilidad. Si se somete a algn tratamiento dental, informe a su dentista que est [employer]. Evite tomar productos que contienen aspirina, acetaminofeno, ibuprofeno, naproxeno o quetoprofeno a menos que as lo indique su mdico. Estos productos pueden disimular la fiebre. No se debe quedar embarazada mientras recibe este medicamento. Las mujeres deben informar a su mdico si estn buscando quedar embarazadas o si creen que estn embarazadas. Existe la posibilidad de efectos secundarios graves a un beb sin nacer. Para ms informacin hable con su profesional de la salud o su farmacutico. No debe Economist a un beb mientras est usando este medicamento. Los hombres deben informar a su mdico si quieren tener nios. Este medicamento puede reducir el conteo de esperma. No trate la diarrea con productos de [employer]. Comunquese con su mdico si tiene diarrea que dura ms de 2 das o si es severa y [place]. Este medicamento puede aumentar la sensibilidad al sol. Mantngase fuera de Arboriculturist. Si no lo puede evitar, utilice ropa protectora y crema de Photographer. No utilice lmparas solares, camas solares ni cabinas solares. Qu efectos secundarios puedo tener al [employer] este medicamento? Efectos secundarios que debe informar a su mdico o a Barrister's clerk de la salud tan pronto como sea posible:  Chief of Staff como erupcin cutnea, picazn o urticarias, hinchazn de la cara, labios o lengua  conteos sanguneos bajos - este medicamento puede reducir la cantidad de glbulos blancos, glbulos rojos y plaquetas. Su riesgo de infeccin y [place].  signos de infeccin - fiebre o escalofros, tos, dolor de garganta, dolor o dificultad para orinar  signos de reduccin de plaquetas o sangrado - magulladuras, puntos rojos en la piel, heces de color oscuro o con aspecto alquitranado, sangre en la orina  signos de reduccin de glbulos rojos - cansancio o debilidad inusual, desmayos, sensacin de mareo  problemas respiratorios  cambios en la visin  dolor en el pecho  llagas en la boca  nuseas, vmito  dolor, hinchazn, enrojecimiento en el lugar de la inyeccin  hormigueo, dolor, entumecimiento de manos o pies  enrojecimiento, hinchazn o llagas en las manos o pies  dolor estomacal  sangrado inusuales Efectos secundarios que, por lo general, no requieren atencin mdica (debe informarlos a su mdico o a su profesional de la salud si persisten o si son molestos):  cambios en las uas de las manos o pies  diarrea  picazn o sequedad de la piel  cada del cabello  dolor de cabeza  prdida del apetito  sensibilidad de los ojos a la luz  Higher education careers adviser  ojos inusualmente llorosos Puede ser que esta lista no menciona todos los posibles efectos secundarios. Comunquese a su mdico por asesoramiento mdico [employer]. Usted puede informar los efectos secundarios a la FDA por telfono al  1-800-FDA-1088. Dnde debo guardar mi medicina? Este medicamento se administra en hospitales o clnicas y no necesitar guardarlo en su domicilio. ATENCIN: Este folleto es un resumen. Puede ser que no cubra toda la posible informacin. Si usted tiene preguntas acerca de esta medicina, consulte con su mdico, su farmacutico o su profesional de Technical sales engineer.  2020 Elsevier/Gold Standard (2014-06-23 00:00:00)

## 2019-09-25 NOTE — Telephone Encounter (Signed)
Scheduled appt per 5/13 los - gave wife appt date and time.

## 2019-09-25 NOTE — Progress Notes (Signed)
[facility] OFFICE PROGRESS NOTE   Diagnosis: Gastric cancer  INTERVAL HISTORY:   Alexander Lawson was discharged in the hospital on 09/19/2019 after admission with febrile neutropenia.  He is here today with his daughter and an interpreter.  No fever.  He has a mild cough.  No dyspnea.  He reports feeling better as compared to prechemotherapy.  No neuropathy symptoms.  He has solid dysphagia.  Objective:  Vital signs in last 24 hours:  Blood pressure (!) 84/62, pulse (!) 109, temperature 98.7 F (37.1 C), temperature source Temporal, resp. rate 16, height 5' 8.5" (1.74 m), weight 113 lb 3.2 oz (51.3 kg), SpO2 100 %.    HEENT: Dried ulcerations at the angle of the lips, no oral ulcers or thrush Resp: Lungs clear bilaterally Cardio: Regular rate and rhythm GI: No hepatosplenomegaly, nontender Vascular: No leg edema  Skin: Dryness of the palms with callus formation, no erythema  Portacath/PICC-without erythema  Lab Results:  Lab Results  Component Value Date   WBC 9.9 09/25/2019   HGB 10.5 (L) 09/25/2019   HCT 32.5 (L) 09/25/2019   MCV 83.1 09/25/2019   PLT 615 (H) 09/25/2019   NEUTROABS 7.0 09/25/2019    CMP  Lab Results  Component Value Date   NA 132 (L) 09/25/2019   K 4.4 09/25/2019   CL 97 (L) 09/25/2019   CO2 25 09/25/2019   GLUCOSE 103 (H) 09/25/2019   BUN 10 09/25/2019   CREATININE 0.62 09/25/2019   CALCIUM 9.3 09/25/2019   PROT 6.9 09/25/2019   ALBUMIN 2.8 (L) 09/25/2019   AST 54 (H) 09/25/2019   ALT 11 09/25/2019   ALKPHOS 148 (H) 09/25/2019   BILITOT 0.4 09/25/2019   GFRNONAA >60 09/25/2019   GFRAA >60 09/25/2019    Lab Results  Component Value Date   CEA1 7.80 (H) 09/04/2019     Medications: I have reviewed the patient's current medications.   Assessment/Plan: 1. Metastatic gastric cancer  EGD 08/15/2019-large ulcerated lesion in the gastric cardia immediately below the GE junction, malignant appearing.  Biopsy-adenocarcinoma, poorly differentiated; HER-2 negative, PD1 combined positive score 3  CT abdomen/pelvis August 25, 2019-large centrally necrotic mass left upper quadrant of the abdomen measuring 12.2 x 9.0 x 8.6 cm, thick irregular enhancing walls, central low density and multiple central air bubbles; the mass is located between the liver, stomach and pancreas and appears inseparable from the lesser curvature of the stomach. Multiple liver lesions throughout all segments. Metastasis within the caudate lobe of the liver exerts mild mass-effect on the IVC which remains patent. Multiple small lymph nodes in the gastrohepatic ligament and retroperitoneum, not pathologically enlarged.  Cycle 1 FOLFOX 09/04/2019  Cycle 2 FOLFOX plus nivolumab 09/24/2019, Udenyca added, 5-FU dose reduced 2. Abdominal pain secondary to #1  3. Dysphagia/odynophagia secondary to #1  4. Anorexia/weight loss secondary to #1  5. Diabetes 6. Mucositis secondary to chemotherapy. Improved 09/15/2019. 7. Admission 09/16/2019 with febrile neutropenia, cough-right lower lobe consolidation on CT    Disposition: Alexander Lawson appears well.  He will complete cycle 2 FOLFOX today.  Alexander Lawson will be added with this cycle.  I reviewed potential toxicities associated with Udenyca including the chance of bone pain, rash, and splenic rupture.  He agrees to proceed.  He will receive nivolumab with this cycle.  The 5-fluorouracil will be dose reduced secondary to mucositis following cycle 1.  Alexander Lawson knows to call for a fever or bleeding.  He will return for an office visit and  cycle 3 chemotherapy in 2 weeks.  He will use a moisturizer on the hands.  I recommended a liquid/mechanical soft diet.  We will asked the Cancer center nutritionist to meet with him.  Alexander Coder, MD  09/25/2019  1:02 PM

## 2019-09-27 ENCOUNTER — Inpatient Hospital Stay: Payer: Self-pay

## 2019-09-27 ENCOUNTER — Other Ambulatory Visit: Payer: Self-pay

## 2019-09-27 VITALS — BP 94/63 | HR 93 | Temp 99.2°F | Resp 18

## 2019-09-27 DIAGNOSIS — C160 Malignant neoplasm of cardia: Secondary | ICD-10-CM

## 2019-09-27 MED ORDER — HEPARIN SOD (PORK) LOCK FLUSH 100 UNIT/ML IV SOLN
500.0000 [IU] | Freq: Once | INTRAVENOUS | Status: AC | PRN
  Administered 2019-09-27: 500 [IU]
  Filled 2019-09-27: qty 5

## 2019-09-27 MED ORDER — SODIUM CHLORIDE 0.9% FLUSH
10.0000 mL | INTRAVENOUS | Status: DC | PRN
  Administered 2019-09-27: 10 mL
  Filled 2019-09-27: qty 10

## 2019-09-27 MED ORDER — PEGFILGRASTIM-CBQV 6 MG/0.6ML ~~LOC~~ SOSY
6.0000 mg | PREFILLED_SYRINGE | Freq: Once | SUBCUTANEOUS | Status: AC
  Administered 2019-09-27: 6 mg via SUBCUTANEOUS

## 2019-09-27 NOTE — Patient Instructions (Signed)

## 2019-09-29 ENCOUNTER — Encounter: Payer: Self-pay | Admitting: Pharmacy Technician

## 2019-09-29 NOTE — Progress Notes (Signed)
Patient has been approved for drug assistance by [employer] for Udenyca. The enrollment period is from 09/25/19-09/24/20 based on self pay. First DOS covered is 09/27/19.

## 2019-10-02 ENCOUNTER — Other Ambulatory Visit: Payer: Self-pay

## 2019-10-02 ENCOUNTER — Ambulatory Visit: Payer: Self-pay

## 2019-10-02 ENCOUNTER — Encounter: Payer: Self-pay | Admitting: Nutrition

## 2019-10-02 ENCOUNTER — Ambulatory Visit: Payer: Self-pay | Admitting: Nurse Practitioner

## 2019-10-02 NOTE — Progress Notes (Signed)
Pharmacist Chemotherapy Monitoring - Follow Up Assessment    I verify that I have reviewed each item in the below checklist:  . Regimen for the patient is scheduled for the appropriate day and plan matches scheduled date. Marland Kitchen Appropriate non-routine labs are ordered dependent on drug ordered. . If applicable, additional medications reviewed and ordered per protocol based on lifetime cumulative doses and/or treatment regimen.   Plan for follow-up and/or issues identified: No . I-vent associated with next due treatment: No . MD and/or nursing notified: No  Alexander Lawson [name] 10/02/2019 2:16 PM

## 2019-10-03 ENCOUNTER — Emergency Department (HOSPITAL_COMMUNITY): Payer: Self-pay

## 2019-10-03 ENCOUNTER — Other Ambulatory Visit: Payer: Self-pay

## 2019-10-03 ENCOUNTER — Emergency Department (HOSPITAL_COMMUNITY)
Admission: EM | Admit: 2019-10-03 | Discharge: 2019-10-04 | Disposition: A | Discharge status: Home / Self Care | Payer: Self-pay | Attending: Emergency Medicine | Admitting: Emergency Medicine

## 2019-10-03 ENCOUNTER — Encounter (HOSPITAL_COMMUNITY): Payer: Self-pay | Admitting: Emergency Medicine

## 2019-10-03 DIAGNOSIS — Z79899 Other long term (current) drug therapy: Secondary | ICD-10-CM | POA: Insufficient documentation

## 2019-10-03 DIAGNOSIS — E860 Dehydration: Secondary | ICD-10-CM | POA: Insufficient documentation

## 2019-10-03 DIAGNOSIS — Z794 Long term (current) use of insulin: Secondary | ICD-10-CM | POA: Insufficient documentation

## 2019-10-03 DIAGNOSIS — C162 Malignant neoplasm of body of stomach: Secondary | ICD-10-CM | POA: Insufficient documentation

## 2019-10-03 DIAGNOSIS — E119 Type 2 diabetes mellitus without complications: Secondary | ICD-10-CM | POA: Insufficient documentation

## 2019-10-03 DIAGNOSIS — R0602 Shortness of breath: Secondary | ICD-10-CM | POA: Insufficient documentation

## 2019-10-03 DIAGNOSIS — Z87891 Personal history of nicotine dependence: Secondary | ICD-10-CM | POA: Insufficient documentation

## 2019-10-03 DIAGNOSIS — R5383 Other fatigue: Secondary | ICD-10-CM | POA: Insufficient documentation

## 2019-10-03 LAB — COMPREHENSIVE METABOLIC PANEL
ALT: 13 U/L (ref 0–44)
AST: 41 U/L (ref 15–41)
Albumin: 3.1 g/dL — ABNORMAL LOW (ref 3.5–5.0)
Alkaline Phosphatase: 172 U/L — ABNORMAL HIGH (ref 38–126)
Anion gap: 11 (ref 5–15)
BUN: 22 mg/dL (ref 8–23)
CO2: 23 mmol/L (ref 22–32)
Calcium: 8.7 mg/dL — ABNORMAL LOW (ref 8.9–10.3)
Chloride: 97 mmol/L — ABNORMAL LOW (ref 98–111)
Creatinine, Ser: 0.55 mg/dL — ABNORMAL LOW (ref 0.61–1.24)
GFR calc Af Amer: >60 mL/min (ref >60)
GFR calc non Af Amer: >60 mL/min (ref >60)
Glucose, Bld: 102 mg/dL — ABNORMAL HIGH (ref 70–99)
Potassium: 4.5 mmol/L (ref 3.5–5.1)
Sodium: 131 mmol/L — ABNORMAL LOW (ref 135–145)
Total Bilirubin: 0.6 mg/dL (ref 0.3–1.2)
Total Protein: 6.5 g/dL (ref 6.5–8.1)

## 2019-10-03 LAB — URINALYSIS, ROUTINE W REFLEX MICROSCOPIC
Bilirubin Urine: NEGATIVE
Glucose, UA: NEGATIVE mg/dL
Ketones, ur: 80 mg/dL — AB
Leukocytes,Ua: NEGATIVE
Nitrite: NEGATIVE
Protein, ur: NEGATIVE mg/dL
Specific Gravity, Urine: 1.027 (ref 1.005–1.030)
pH: 6 (ref 5.0–8.0)

## 2019-10-03 LAB — CBC WITH DIFFERENTIAL/PLATELET
Abs Immature Granulocytes: 0.86 10*3/uL — ABNORMAL HIGH (ref 0.00–0.07)
Basophils Absolute: 0.1 10*3/uL (ref 0.0–0.1)
Basophils Relative: 0 %
Eosinophils Absolute: 0 10*3/uL (ref 0.0–0.5)
Eosinophils Relative: 0 %
HCT: 28.9 % — ABNORMAL LOW (ref 39.0–52.0)
Hemoglobin: 9.4 g/dL — ABNORMAL LOW (ref 13.0–17.0)
Immature Granulocytes: 3 %
Lymphocytes Relative: 5 %
Lymphs Abs: 1.7 10*3/uL (ref 0.7–4.0)
MCH: 27.2 pg (ref 26.0–34.0)
MCHC: 32.5 g/dL (ref 30.0–36.0)
MCV: 83.8 fL (ref 80.0–100.0)
Monocytes Absolute: 1.9 10*3/uL — ABNORMAL HIGH (ref 0.1–1.0)
Monocytes Relative: 6 %
Neutro Abs: 29.7 10*3/uL — ABNORMAL HIGH (ref 1.7–7.7)
Neutrophils Relative %: 86 %
Platelets: 470 10*3/uL — ABNORMAL HIGH (ref 150–400)
RBC: 3.45 MIL/uL — ABNORMAL LOW (ref 4.22–5.81)
RDW: 17 % — ABNORMAL HIGH (ref 11.5–15.5)
WBC: 34.3 10*3/uL — ABNORMAL HIGH (ref 4.0–10.5)
nRBC: 0 % (ref 0.0–0.2)

## 2019-10-03 LAB — TROPONIN I (HIGH SENSITIVITY)
Troponin I (High Sensitivity): 7 ng/L (ref <18)
Troponin I (High Sensitivity): 10 ng/L (ref <18)

## 2019-10-03 LAB — D-DIMER, QUANTITATIVE: D-Dimer, Quant: 3.37 ug/mL-FEU — ABNORMAL HIGH (ref 0.00–0.50)

## 2019-10-03 LAB — LACTIC ACID, PLASMA: Lactic Acid, Venous: 1 mmol/L (ref 0.5–1.9)

## 2019-10-03 MED ORDER — ONDANSETRON HCL 4 MG/2ML IJ SOLN
4.0000 mg | Freq: Once | INTRAMUSCULAR | Status: AC
  Administered 2019-10-03: 4 mg via INTRAVENOUS
  Filled 2019-10-03: qty 2

## 2019-10-03 MED ORDER — SODIUM CHLORIDE 0.9 % IV BOLUS
1000.0000 mL | Freq: Once | INTRAVENOUS | Status: AC
  Administered 2019-10-03: 1000 mL via INTRAVENOUS

## 2019-10-03 MED ORDER — SODIUM CHLORIDE 0.9 % IV BOLUS
500.0000 mL | Freq: Once | INTRAVENOUS | Status: AC
  Administered 2019-10-03: 500 mL via INTRAVENOUS

## 2019-10-03 MED ORDER — SODIUM CHLORIDE (PF) 0.9 % IJ SOLN
INTRAMUSCULAR | Status: AC
  Filled 2019-10-03: qty 50

## 2019-10-03 MED ORDER — IOHEXOL 350 MG/ML SOLN
100.0000 mL | Freq: Once | INTRAVENOUS | Status: AC | PRN
  Administered 2019-10-03: 100 mL via INTRAVENOUS

## 2019-10-03 NOTE — ED Provider Notes (Addendum)
[facility] DEPT Provider Note   CSN: [id number] Arrival date & time: 10/03/19  1635     History Chief Complaint  Patient presents with  . Failure To Thrive  . Weakness    Alexander Lawson is a 74 y.o. male.  Patient is a 74 year old male who presents with generalized fatigue and nausea.  He has a history of metastatic gastric cancer and is undergoing chemotherapy.  He says over the last 2 to 3 days he has had increasing weakness and lightheadedness on standing.  He is not able to walk around due to the weakness and dizziness.  He has had some persistent nausea with couple episodes of vomiting but none today.  He has persistent nausea however.  He has had 2 stools today but no diarrhea.  No blood in his stool or emesis.  He does have some central chest pain that is worse when he takes a deep breath.  He denies any shortness of breath.  No known fevers.  No leg swelling.  No unilateral weakness.  No cough or cold symptoms.  History was obtained through bedside staff interpreter.        Past Medical History:  Diagnosis Date  . Cancer ([place])    pancreas  . Diabetes mellitus without complication [facility])     Patient Active Problem List   Diagnosis Date Noted  . Aspiration pneumonia ([place]) 09/17/2019  . Sepsis ([place]) 09/17/2019  . Neutropenia with fever ([place]) 09/17/2019  . Anemia associated with chemotherapy 09/17/2019  . Malignant neoplasm of cardia of stomach ([place]) 09/12/2019  . Gastric cancer ([place]) 08/29/2019  . Goals of care, counseling/discussion 08/29/2019    Past Surgical History:  Procedure Laterality Date  . IR IMAGING GUIDED PORT INSERTION  09/03/2019       Family History  Problem Relation Age of Onset  . Asthma Mother     Social History   Tobacco Use  . Smoking status: Former Research scientist (life sciences)  . Smokeless tobacco: Never Used  Substance Use Topics  . Alcohol use: Not Currently  . Drug use: Not Currently    Home Medications Prior to  Admission medications   Medication Sig Start Date End Date Taking? Authorizing Provider  ferrous sulfate 325 (65 FE) MG tablet Take 1 tablet (325 mg total) by mouth daily. 09/19/19 11/18/19 Yes Adhikari, Tamsen Meek, MD  insulin NPH Human (NOVOLIN N) 100 UNIT/ML injection Inject 5-10 Units into the skin daily as needed (5 units if BS over 150/10 units if BS over 200).   Yes [provider]  magic mouthwash SOLN Take 5 mLs by mouth 4 (four) times daily as needed for mouth pain (swish and spit). 09/10/19  Yes Ladell Pier, MD  prochlorperazine (COMPAZINE) 10 MG tablet Take 1 tablet (10 mg total) by mouth every 6 (six) hours as needed for nausea. 09/02/19  Yes Ladell Pier, MD  lidocaine-prilocaine (EMLA) cream Apply 1 application topically as directed. Apply to port site 1 hour prior to stick and cover with plastic wrap. Start with 2nd chemo treatment Patient not taking: Reported on 09/25/2019 09/02/19   Ladell Pier, MD  oxyCODONE (ROXICODONE) 5 MG immediate release tablet Take 1 tablet (5 mg total) by mouth every 6 (six) hours as needed for up to 20 doses for severe pain. Patient not taking: Reported on 09/25/2019 08/25/19   Lennice Sites, DO  polyethylene glycol (MIRALAX / GLYCOLAX) 17 g packet Take 17 g by mouth daily. Patient not taking: Reported on 09/25/2019  09/20/19   Shelly Coss, MD    Allergies    Patient has no known allergies.  Review of Systems   Review of Systems  Constitutional: Positive for fatigue. Negative for chills, diaphoresis and fever.  HENT: Negative for congestion, rhinorrhea and sneezing.   Eyes: Negative.   Respiratory: Negative for cough, chest tightness and shortness of breath.   Cardiovascular: Positive for chest pain. Negative for leg swelling.  Gastrointestinal: Positive for nausea and vomiting. Negative for abdominal pain, blood in stool and diarrhea.  Genitourinary: Negative for difficulty urinating, flank pain, frequency and hematuria.    Musculoskeletal: Negative for arthralgias and back pain.  Skin: Negative for rash.  Neurological: Positive for weakness (Generalized) and light-headedness. Negative for speech difficulty, numbness and headaches.    Physical Exam Updated Vital Signs BP 111/70   Pulse 71   Temp (!) 97.5 F (36.4 C) (Oral)   Resp 16   Ht 5' 8.5" (1.74 m)   Wt 51.3 kg   SpO2 100%   BMI 16.96 kg/m   Physical Exam Constitutional:      Appearance: He is well-developed. He is cachectic.  HENT:     Head: Normocephalic and atraumatic.     Mouth/Throat:     Mouth: Mucous membranes are dry.  Eyes:     Pupils: Pupils are equal, round, and reactive to light.  Cardiovascular:     Rate and Rhythm: Normal rate and regular rhythm.     Heart sounds: Normal heart sounds.  Pulmonary:     Effort: Pulmonary effort is normal. No respiratory distress.     Breath sounds: Normal breath sounds. No wheezing or rales.  Chest:     Chest wall: No tenderness.  Abdominal:     General: Bowel sounds are normal.     Palpations: Abdomen is soft.     Tenderness: There is no abdominal tenderness. There is no guarding or rebound.  Musculoskeletal:        General: Normal range of motion.     Cervical back: Normal range of motion and neck supple.     Comments: No edema or calf tenderness  Lymphadenopathy:     Cervical: No cervical adenopathy.  Skin:    General: Skin is warm and dry.     Findings: No rash.  Neurological:     General: No focal deficit present.     Mental Status: He is alert and oriented to person, place, and time.     ED Results / Procedures / Treatments   Labs (all labs ordered are listed, but only abnormal results are displayed) Labs Reviewed  COMPREHENSIVE METABOLIC PANEL - Abnormal; Notable for the following components:      Result Value   Sodium 131 (*)    Chloride 97 (*)    Glucose, Bld 102 (*)    Creatinine, Ser 0.55 (*)    Calcium 8.7 (*)    Albumin 3.1 (*)    Alkaline Phosphatase 172  (*)    All other components within normal limits  CBC WITH DIFFERENTIAL/PLATELET - Abnormal; Notable for the following components:   WBC 34.3 (*)    RBC 3.45 (*)    Hemoglobin 9.4 (*)    HCT 28.9 (*)    RDW 17.0 (*)    Platelets 470 (*)    Neutro Abs 29.7 (*)    Monocytes Absolute 1.9 (*)    Abs Immature Granulocytes 0.86 (*)    All other components within normal limits  URINALYSIS, ROUTINE W  REFLEX MICROSCOPIC - Abnormal; Notable for the following components:   Hgb urine dipstick MODERATE (*)    Ketones, ur 80 (*)    Bacteria, UA RARE (*)    All other components within normal limits  D-DIMER, QUANTITATIVE (NOT AT [facility]) - Abnormal; Notable for the following components:   D-Dimer, Quant 3.37 (*)    All other components within normal limits  LACTIC ACID, PLASMA  LACTIC ACID, PLASMA  TROPONIN I (HIGH SENSITIVITY)  TROPONIN I (HIGH SENSITIVITY)    EKG EKG Interpretation  Date/Time:  Friday Oct 03 2019 17:42:12 EDT Ventricular Rate:  83 PR Interval:    QRS Duration: 94 QT Interval:  386 QTC Calculation: 454 R Axis:   -16 Text Interpretation: Sinus rhythm Borderline left axis deviation Anteroseptal infarct, age indeterminate since last tracing no significant change Confirmed by Malvin Johns [phone]) on 10/03/2019 5:55:50 PM   Radiology CT Angio Chest PE W/Cm &/Or Wo Cm  Result Date: 10/03/2019 CLINICAL DATA:  Shortness of breath. Failure to thrive. Active chemotherapy. EXAM: CT ANGIOGRAPHY CHEST WITH CONTRAST TECHNIQUE: Multidetector CT imaging of the chest was performed using the standard protocol during bolus administration of intravenous contrast. Multiplanar CT image reconstructions and MIPs were obtained to evaluate the vascular anatomy. CONTRAST:  173mL OMNIPAQUE IOHEXOL 350 MG/ML SOLN COMPARISON:  Chest CT 09/16/2019 FINDINGS: Cardiovascular: There are no filling defects within the pulmonary arteries to suggest pulmonary embolus. The thoracic aorta is normal in caliber.  Cannot assess for dissection given phase of contrast tailored to pulmonary artery evaluation. Minor aortic atherosclerosis right chest port in place, tip obscured by dense contrast. Heart is normal in size. No pericardial effusion. Mediastinum/Nodes: Calcified left hilar lymph nodes again seen. No evidence of noncalcified adenopathy. Thyroid gland and esophagus are unremarkable. Lungs/Pleura: Breathing motion artifact through the lung bases. There scattered subcentimeter pulmonary nodules that are unchanged from prior exam. Nodules in the left apex series 7, images 17, 18, and 21. Nodule in the right upper lobe series 7, image 32. Subpleural right middle lobe pulmonary nodules, series 7 image 94 and 116, and paramediastinal nodule in the right middle lobe image 97. calcified granuloma in the lingula. Suggestion of subpleural nodules at the right lung base that are obscured by breathing motion. Improved bibasilar atelectasis from prior. No pleural fluid. Upper Abdomen: No hepatic metastatic disease. Known left upper quadrant mass lesion, partially included. No significant upper abdominal free fluid. Musculoskeletal: No lytic or blastic osseous lesion. No acute osseous abnormality. Generalized paucity of body fat which may represent cachexia. Review of the MIP images confirms the above findings. IMPRESSION: 1. No pulmonary embolus. 2. Multiple subcentimeter pulmonary nodules, unchanged from CT 2 and half weeks ago. At least 1 of these nodules is a calcified granuloma. Remaining nodules are indeterminate in regards to metastatic disease. 3. Improved bibasilar aeration with residual atelectasis. 4. Known left upper quadrant mass lesion, partially included. Aortic Atherosclerosis (ICD10-I70.0). Electronically Signed   By: Keith Rake M.D.   On: 10/03/2019 21:04    Procedures Procedures (including critical care time)  Medications Ordered in ED Medications  sodium chloride (PF) 0.9 % injection (has no  administration in time range)  sodium chloride 0.9 % bolus 1,000 mL (0 mLs Intravenous Stopped 10/03/19 1934)  ondansetron (ZOFRAN) injection 4 mg (4 mg Intravenous Given 10/03/19 1800)  iohexol (OMNIPAQUE) 350 MG/ML injection 100 mL (100 mLs Intravenous Contrast Given 10/03/19 2023)  sodium chloride 0.9 % bolus 500 mL (0 mLs Intravenous Stopped 10/03/19 2244)  ED Course  I have reviewed the triage vital signs and the nursing notes.  Pertinent labs & imaging results that were available during my care of the patient were reviewed by me and considered in my medical decision making (see chart for details).    MDM Rules/Calculators/A&P                      Patient is a 74 year old male who presents with generalized fatigue and nausea, likely related to recent chemotherapy.  He also had a recent infusion of pegfilgrastim to stimulate his neutrophils.  His white count is markedly elevated.  I spoke with Dr. Jana Hakim who feels that this would be an appropriate response from the infusion.  Given that the patient does not have a fever or an elevated lactate, this is unlikely to be related to infectious etiology.  He does not have any cough or other suggestions of infection.  His sodium is a little bit low and his hemoglobin is low, likely related to his underlying cancer state.  He does not have any suggestions of acute bleeding.  He was having some pleuritic type chest pain.  His D-dimer was mildly elevated.  CT scan shows no evidence of PE.  He does not have symptoms that would be more concerning for ACS.  His troponin is negative.  No ischemic changes on EKG.  He is feeling much better in the ED with IV fluids.  He is able to ambulate without significant symptoms.  He is tolerating oral fluids.  He is ready to go home.  He was discharged home in good condition.  He was encouraged to follow-up with Dr. Benay Spice, his oncologist.  Return precautions were given.  Discharge instructions were given through video  language line. Final Clinical Impression(s) / ED Diagnoses Final diagnoses:  Dehydration  Fatigue, unspecified type    Rx / DC Orders ED Discharge Orders    None       Malvin Johns, MD 10/04/19 [place]    Malvin Johns, MD 10/04/19 0004

## 2019-10-03 NOTE — ED Triage Notes (Signed)
Patient presents to the hospital due to failure to thrive.His family reports weakness for the past 3-4 days. Patient is unable to hold an upright posture. He is currently undergoing chemo for stomach cancer. EMS administered 750 cc of fluid. Family would like to have him evaluated and EMS believes hospice may be beneficial.   EMS vitals: 106/74 BP 90 HR

## 2019-10-04 ENCOUNTER — Other Ambulatory Visit: Payer: Self-pay | Admitting: Oncology

## 2019-10-07 MED FILL — Dexamethasone Sodium Phosphate Inj 100 MG/10ML: INTRAMUSCULAR | Qty: 1 | Status: AC

## 2019-10-08 ENCOUNTER — Inpatient Hospital Stay: Payer: Self-pay

## 2019-10-08 ENCOUNTER — Inpatient Hospital Stay: Payer: Self-pay | Admitting: Nutrition

## 2019-10-08 ENCOUNTER — Inpatient Hospital Stay (HOSPITAL_BASED_OUTPATIENT_CLINIC_OR_DEPARTMENT_OTHER): Payer: Self-pay | Admitting: Nurse Practitioner

## 2019-10-08 ENCOUNTER — Encounter: Payer: Self-pay | Admitting: Nurse Practitioner

## 2019-10-08 ENCOUNTER — Other Ambulatory Visit: Payer: Self-pay

## 2019-10-08 ENCOUNTER — Encounter: Payer: Self-pay | Admitting: Dietician

## 2019-10-08 VITALS — BP 90/58 | HR 98 | Temp 97.7°F | Resp 18 | Wt 107.9 lb

## 2019-10-08 DIAGNOSIS — C160 Malignant neoplasm of cardia: Secondary | ICD-10-CM

## 2019-10-08 LAB — CMP (CANCER CENTER ONLY)
ALT: 10 U/L (ref 0–44)
AST: 44 U/L — ABNORMAL HIGH (ref 15–41)
Albumin: 2.9 g/dL — ABNORMAL LOW (ref 3.5–5.0)
Alkaline Phosphatase: 190 U/L — ABNORMAL HIGH (ref 38–126)
Anion gap: 9 (ref 5–15)
BUN: 13 mg/dL (ref 8–23)
CO2: 27 mmol/L (ref 22–32)
Calcium: 8.9 mg/dL (ref 8.9–10.3)
Chloride: 96 mmol/L — ABNORMAL LOW (ref 98–111)
Creatinine: 0.62 mg/dL (ref 0.61–1.24)
GFR, Est AFR Am: >60 mL/min (ref >60)
GFR, Estimated: >60 mL/min (ref >60)
Glucose, Bld: 117 mg/dL — ABNORMAL HIGH (ref 70–99)
Potassium: 4.3 mmol/L (ref 3.5–5.1)
Sodium: 132 mmol/L — ABNORMAL LOW (ref 135–145)
Total Bilirubin: 0.3 mg/dL (ref 0.3–1.2)
Total Protein: 6.4 g/dL — ABNORMAL LOW (ref 6.5–8.1)

## 2019-10-08 LAB — CBC WITH DIFFERENTIAL (CANCER CENTER ONLY)
Abs Immature Granulocytes: 1.2 10*3/uL — ABNORMAL HIGH (ref 0.00–0.07)
Basophils Absolute: 0.1 10*3/uL (ref 0.0–0.1)
Basophils Relative: 0 %
Eosinophils Absolute: 0.1 10*3/uL (ref 0.0–0.5)
Eosinophils Relative: 0 %
HCT: 24 % — ABNORMAL LOW (ref 39.0–52.0)
Hemoglobin: 8 g/dL — ABNORMAL LOW (ref 13.0–17.0)
Immature Granulocytes: 3 %
Lymphocytes Relative: 6 %
Lymphs Abs: 2.1 10*3/uL (ref 0.7–4.0)
MCH: 27.6 pg (ref 26.0–34.0)
MCHC: 33.3 g/dL (ref 30.0–36.0)
MCV: 82.8 fL (ref 80.0–100.0)
Monocytes Absolute: 1.3 10*3/uL — ABNORMAL HIGH (ref 0.1–1.0)
Monocytes Relative: 4 %
Neutro Abs: 31.4 10*3/uL — ABNORMAL HIGH (ref 1.7–7.7)
Neutrophils Relative %: 87 %
Platelet Count: 423 10*3/uL — ABNORMAL HIGH (ref 150–400)
RBC: 2.9 MIL/uL — ABNORMAL LOW (ref 4.22–5.81)
RDW: 18.6 % — ABNORMAL HIGH (ref 11.5–15.5)
WBC Count: 36.2 10*3/uL — ABNORMAL HIGH (ref 4.0–10.5)
nRBC: 0.1 % (ref 0.0–0.2)

## 2019-10-08 LAB — CEA (IN HOUSE-CHCC): CEA (CHCC-In House): 15.03 ng/mL — ABNORMAL HIGH (ref 0.00–5.00)

## 2019-10-08 MED ORDER — SODIUM CHLORIDE 0.9 % IV SOLN
INTRAVENOUS | Status: DC
  Filled 2019-10-08 (×2): qty 250

## 2019-10-08 MED ORDER — PREDNISONE 10 MG PO TABS: 10.0000 mg | ORAL_TABLET | Freq: Every day | ORAL | 0 refills | Status: AC

## 2019-10-08 MED ORDER — OXYCODONE HCL 5 MG PO TABS: 5.0000 mg | ORAL_TABLET | Freq: Four times a day (QID) | ORAL | 0 refills | Status: DC | PRN

## 2019-10-08 MED ORDER — HEPARIN SOD (PORK) LOCK FLUSH 100 UNIT/ML IV SOLN
500.0000 [IU] | Freq: Once | INTRAVENOUS | Status: AC
  Administered 2019-10-08: 500 [IU]
  Filled 2019-10-08: qty 5

## 2019-10-08 MED ORDER — SODIUM CHLORIDE 0.9% FLUSH
10.0000 mL | Freq: Once | INTRAVENOUS | Status: AC
  Administered 2019-10-08: 10 mL
  Filled 2019-10-08: qty 10

## 2019-10-08 NOTE — Patient Instructions (Signed)
Deshidratacin en los adultos Dehydration, Adult La deshidratacin es una afeccin que se caracteriza por una cantidad insuficiente de agua u otros lquidos en el organismo. Esto sucede cuando una persona pierde ms lquidos de los que consume. Las partes importantes del cuerpo no pueden funcionar correctamente sin una cantidad [place] de lquidos. Cualquier prdida de lquidos del organismo puede causar deshidratacin. La deshidratacin puede ser leve, grave o muy grave. Debe tratarse de inmediato para evitar que sea muy grave. Cules son las causas? Esta afeccin puede ser causada por lo siguiente:  Afecciones que hacen que pierda agua u otros lquidos, por ejemplo: ? Materia fecal lquida (diarrea). ? Vmitos. ? Sudoracin abundante. ? Hacer mucho pis (orinar).  No beber suficientes lquidos, especialmente cuando: ? Usted est enfermo. ? Hace cosas que requieren Automatic Data.  Otras enfermedades y afecciones, como fiebre o infeccin.  Determinados medicamentos, como aquellos que eliminan el exceso de lquido del organismo (diurticos).  Falta de agua potable segura.  No poder obtener suficiente agua y alimentos. Qu incrementa el riesgo? Los siguientes factores pueden hacer que sea ms propenso a Armed forces training and education officer afeccin:  Tener una enfermedad prolongada (crnica) que no se ha tratado de Estate agent, como: ? Diabetes. ? Enfermedad cardaca. ? Enfermedad renal.  Ser mayor de 65aos de edad.  Tener una discapacidad.  Vivir en un lugar alto con respecto al suelo o al nivel del mar (de gran altitud). El aire menos denso y ms seco causa ms prdida de lquidos.  Hacer ejercicios que sobrecargan el cuerpo [employer]. Cules son los signos o sntomas? Los sntomas de deshidratacin dependen de su gravedad. Deshidratacin leve o grave  Sed.  Sequedad en los labios o la boca.  Sensacin de [employer] o desvanecimiento, especialmente al ponerse de pie  despus de estar sentado.  Calambres musculares.  Su cuerpo produce: ? Pis (orina) de color oscuro. Orina que puede tener el color del t. ? Bennie Hind menos que lo normal. ? Tiene menos lgrimas que lo normal.  Dolor de [place]. Deshidratacin muy grave  Cambios en la piel. La piel puede: ? Estar fra al tacto (pegajosa). ? Tener manchas o estar plida. ? No volver a la normalidad de inmediato despus de pellizcarla ligeramente y soltarla.  Escasa produccin o ausencia de lgrimas, orina o sudor.  Cambios en los signos vitales, por ejemplo: ? Respiracin acelerada. ? Presin arterial baja. ? Pulso dbil. ? Pulso que supera los 100latidos por minuto cuando est sentado y [place].  Otros cambios, por ejemplo: ? Catering manager sed. ? Ojos que se ven huecos (hundidos). ? Manos y pies fros. ? Estar desorientado (confundido). ? Estar muy cansado (aletargado) o tener problemas para despertarse. ? Prdida de peso a Control and instrumentation engineer. ? Prdida de la conciencia. Cmo se trata? El tratamiento de esta afeccin depende de su gravedad. El tratamiento debe comenzar de inmediato. No espere hasta que su afeccin sea muy grave. La deshidratacin muy grave es Engineer, maintenance (IT). Tendr que ir al hospital.  La deshidratacin leve o grave puede tratarse en la casa. Le pedirn que: ? Beba ms lquidos. ? Beba una solucin de rehidratacin oral (SRO). Esta bebida ayuda a reponer las cantidades correctas de lquidos y de sales y Optometrist en la sangre (electrolitos).  La deshidratacin muy grave puede tratarse: ? Con lquidos a travs de un tubo (catter) intravenoso. ? Restableciendo los [employer] de sales y Public librarian. A menudo, para esto, se administran sales y [employer] a travs de un  tubo. El tubo se introduce a travs de la nariz hasta el estmago. ? Tratando el origen del problema. Siga estas instrucciones en su casa: Solucin de rehidratacin oral Si se lo indica el mdico, beba  una SRO:  Prepare una SRO. Use las instrucciones del envase.  Comience por beber pequeas cantidades, aproximadamente taza (120ml) cada 5 a 10minutos.  Beba de a poco hasta llegar a la cantidad indicada por el mdico. Comida y bebida         Beba suficiente lquido transparente como para mantener la orina de color amarillo plido. Si le indicaron que tome una SRO, termine primero la solucin. Luego, comience a beber lentamente otros lquidos transparentes. Beba lquidos, por ejemplo: ? Agua. No beba solamente agua. Esto puede hacer que los niveles de sal (sodio) en el organismo bajen demasiado. ? Agua de trocitos de hielo que usted succiona. ? Jugo de frutas con agregado de agua (diluido). ? Bebidas deportivas de bajas caloras.  Consuma alimentos que contengan la cantidad adecuada de sales y minerales, por ejemplo: ? Bananas. ? [place]aranjas. ? Papas. ? [place]. ? [place].  No beba alcohol.  Evite lo siguiente: ? Bebidas que contengan gran cantidad de azcar. Esto incluye lo siguiente:  Bebidas deportivas ricas en caloras.  Jugo de frutas sin agregado de agua.  Gaseosas.  Cafena. ? Alimentos grasos o que contienen mucha grasa o azcar. Instrucciones generales  Use los medicamentos de venta libre y los recetados solamente como se lo haya indicado el mdico.  No tome comprimidos de sal. Esto puede hacer que los niveles de sal en el cuerpo suban demasiado.  Retome sus actividades normales segn lo indicado por el mdico. Pregntele al mdico qu actividades son seguras para usted.  Concurra a todas las visitas de seguimiento como se lo haya indicado el mdico. Esto es importante. Comunquese con un mdico si:  Tiene dolor de vientre (abdomen) y el dolor: ? Empeora. ? Permanece en un solo lugar.  Tiene una erupcin cutnea.  Presenta rigidez en el cuello.  Se siente enojado o molesto (irritable) con ms facilidad de lo normal.  Est ms cansado o le cuesta  despertarse ms de lo normal.  Presenta los siguientes sntomas: ? Debilidad o mareos. ? Mucha sed. Solicite ayuda inmediatamente si tiene:  Algn sntoma de deshidratacin muy grave.  Sntomas de vmitos, por ejemplo: ? No puede comer ni beber sin vomitar. ? Los vmitos empeoran o no desaparecen. ? Su vmito tiene sangre o una sustancia verde.  Sntomas que empeoran con el tratamiento.  Fiebre.  Dolor de cabeza muy intenso.  Problemas para orinar o para defecar (tener deposiciones), como: ? Heces acuosas que empeoran o que no desaparecen. ? Sangre en las heces (deposiciones). Esto puede hacer que la materia fecal sea negra y tenga aspecto alquitranado. ? No orinar durante 6 a 8 horas. ? Hacer una cantidad pequea de orina muy oscura en el trmino de 6 a 8horas.  Dificultad para respirar. Estos sntomas pueden indicar una emergencia. No espere a ver si los sntomas desaparecen. Solicite atencin mdica de inmediato. Comunquese con el servicio de emergencias de su localidad (911 en los [place]). No conduzca por sus propios medios hasta el hospital. Resumen  La deshidratacin es una afeccin que se caracteriza por una cantidad insuficiente de agua u otros lquidos en el organismo. Esto sucede cuando una persona pierde ms lquidos de los que consume.  El tratamiento de esta afeccin depende de su gravedad. El tratamiento se debe comenzar   de inmediato. No espere hasta que su afeccin sea muy grave.  Beba suficiente lquido transparente como para mantener la orina de color amarillo plido. Si le indicaron que tome una solucin de rehidratacin oral (SRO), termine primero la solucin. Luego, comience a beber lentamente otros lquidos transparentes.  Use los medicamentos de venta libre y los recetados solamente como se lo haya indicado el mdico.  Obtenga ayuda de inmediato si tiene algn sntoma de deshidratacin muy grave. Esta informacin no tiene como fin reemplazar el  consejo del mdico. Asegrese de hacerle al mdico cualquier pregunta que tenga. Document Revised: 01/16/2019 Document Reviewed: 01/16/2019 Elsevier Patient Education  2020 Elsevier Inc.  

## 2019-10-08 NOTE — Progress Notes (Addendum)
[facility] OFFICE PROGRESS NOTE   Diagnosis: Gastric cancer  INTERVAL HISTORY:   [name] returns as scheduled.  He completed cycle 2 FOLFOX plus nivolumab 09/24/2019.  He received Udenyca on the day of pump discontinuation.  He was seen in the emergency department 10/03/2019 with fatigue/weakness.  He received IV fluids with improvement.  He was discharged home.  He had nausea/vomiting for 2 days following chemotherapy.  He feels weak.  He has intermittent headaches.  He felt "a little better" after IV fluids in the emergency department.  No mouth sores.  No diarrhea.  Left-sided abdominal pain is better.  Family reports overall good control of blood sugars.  Objective:  Vital signs in last 24 hours:  Blood pressure (!) 90/58, pulse 98, temperature 97.7 F (36.5 C), temperature source Oral, resp. rate 18, weight 107 lb 14.4 oz (48.9 kg), SpO2 100 %.    HEENT: White coating over tongue.  No buccal thrush.  Mucous membranes appear moist. Resp: Lungs clear bilaterally. Cardio: Regular rate and rhythm. GI: No hepatomegaly.  Left-sided abdominal mass not definitely palpable on today's exam. Vascular: No leg edema. Port-A-Cath without erythema.  Lab Results:  Lab Results  Component Value Date   WBC 36.2 (H) 10/08/2019   HGB 8.0 (L) 10/08/2019   HCT 24.0 (L) 10/08/2019   MCV 82.8 10/08/2019   PLT 423 (H) 10/08/2019   NEUTROABS 31.4 (H) 10/08/2019    Imaging:  No results found.  Medications: I have reviewed the patient's current medications.  Assessment/Plan: 1. Metastatic gastric cancer  EGD 08/15/2019-large ulcerated lesion in the gastric cardia immediately below the GE junction, malignant appearing. Biopsy-adenocarcinoma, poorly differentiated; HER-2 negative, PD1 combined positive score 3  CT abdomen/pelvis August 25, 2019-large centrally necrotic mass left upper quadrant of the abdomen measuring 12.2 x 9.0 x 8.6 cm, thick irregular enhancing walls,  central low density and multiple central air bubbles; the mass is located between the liver, stomach and pancreas and appears inseparable from the lesser curvature of the stomach. Multiple liver lesions throughout all segments. Metastasis within the caudate lobe of the liver exerts mild mass-effect on the IVC which remains patent. Multiple small lymph nodes in the gastrohepatic ligament and retroperitoneum, not pathologically enlarged.  Cycle 1 FOLFOX 09/04/2019  Cycle 2 FOLFOX plus nivolumab 09/24/2019, Udenyca added, 5-FU dose reduced 2. Abdominal pain secondary to #1  3. Dysphagia/odynophagia secondary to #1  4. Anorexia/weight loss secondary to #1  5. Diabetes 6. Mucositis secondary to chemotherapy. Improved 09/15/2019. 7. Admission 09/16/2019 with febrile neutropenia, cough-right lower lobe consolidation on CT  Disposition: Alexander Lawson appears unchanged.  He has completed 2 cycles of FOLFOX.  Nivolumab was added with cycle 2.  He declines chemotherapy today.  He would like to receive IV fluids and return in 2 weeks for reevaluation, possible treatment.  If he elects to proceed with treatment in 2 weeks chemotherapy doses will be adjusted.  Plan for IV fluids today.  He will begin prednisone 10 mg daily.  His family understands to monitor blood sugars closely.  He will return for lab, follow-up, possible chemotherapy in 2 weeks.  He will contact the office in the interim with any problems.  Patient seen with Dr. Benay Spice.  Ned Card ANP/GNP-BC   10/08/2019  11:00 AM This was a shared visit with Ned Card.  Alexander Lawson continues to have a poor performance status.  It is unclear whether his current symptoms are related to persistent/progression of the gastric cancer versus  toxicity from chemotherapy.  Chemotherapy will be held today.  Alexander Manson, MD

## 2019-10-08 NOTE — Progress Notes (Unsigned)
Nutrition  Patient scheduled for nutrition follow-up in infusion for metastatic gastric cancer this afternoon. Patient was seen in ED on 10/03/19 with weakness/fatigue, patient improved after receiving IV fluids and discharged home. Patient received IV fluids, declined chemotherapy today and unable to see. Patient will return in 2 weeks for reevaluation, possible treatment. Will complete nutrition follow-up on 6/10 during infusion as able.   Lajuan Lines, RD, LDN Clinical Nutrition After Hours/Weekend Pager # in [place]

## 2019-10-10 ENCOUNTER — Inpatient Hospital Stay: Payer: Self-pay

## 2019-10-15 ENCOUNTER — Telehealth: Payer: Self-pay | Admitting: *Deleted

## 2019-10-15 NOTE — Telephone Encounter (Signed)
Daughter called to ask if OK for patient to have COVID vaccine? Confirmed that yes, Dr. Benay Spice would agree to this. Requested they bring his vaccine card to appointment to dates can be documented in his record.

## 2019-10-16 ENCOUNTER — Inpatient Hospital Stay (HOSPITAL_COMMUNITY)
Admission: EM | Admit: 2019-10-16 | Discharge: 2019-10-19 | DRG: 374 | Disposition: A | Discharge status: Home / Self Care | Payer: Self-pay | Attending: Internal Medicine | Admitting: Internal Medicine

## 2019-10-16 ENCOUNTER — Ambulatory Visit: Payer: Self-pay | Attending: Internal Medicine

## 2019-10-16 ENCOUNTER — Encounter (HOSPITAL_COMMUNITY): Payer: Self-pay | Admitting: Internal Medicine

## 2019-10-16 ENCOUNTER — Emergency Department (HOSPITAL_COMMUNITY): Payer: Self-pay

## 2019-10-16 ENCOUNTER — Other Ambulatory Visit: Payer: Self-pay

## 2019-10-16 DIAGNOSIS — Z79899 Other long term (current) drug therapy: Secondary | ICD-10-CM

## 2019-10-16 DIAGNOSIS — R55 Syncope and collapse: Secondary | ICD-10-CM | POA: Diagnosis present

## 2019-10-16 DIAGNOSIS — Z20822 Contact with and (suspected) exposure to covid-19: Secondary | ICD-10-CM | POA: Diagnosis present

## 2019-10-16 DIAGNOSIS — D638 Anemia in other chronic diseases classified elsewhere: Secondary | ICD-10-CM | POA: Diagnosis present

## 2019-10-16 DIAGNOSIS — Z794 Long term (current) use of insulin: Secondary | ICD-10-CM

## 2019-10-16 DIAGNOSIS — Z681 Body mass index (BMI) 19 or less, adult: Secondary | ICD-10-CM

## 2019-10-16 DIAGNOSIS — E43 Unspecified severe protein-calorie malnutrition: Secondary | ICD-10-CM | POA: Diagnosis present

## 2019-10-16 DIAGNOSIS — Z23 Encounter for immunization: Secondary | ICD-10-CM

## 2019-10-16 DIAGNOSIS — Z87891 Personal history of nicotine dependence: Secondary | ICD-10-CM

## 2019-10-16 DIAGNOSIS — R627 Adult failure to thrive: Secondary | ICD-10-CM | POA: Diagnosis present

## 2019-10-16 DIAGNOSIS — Z825 Family history of asthma and other chronic lower respiratory diseases: Secondary | ICD-10-CM

## 2019-10-16 DIAGNOSIS — T451X5A Adverse effect of antineoplastic and immunosuppressive drugs, initial encounter: Secondary | ICD-10-CM | POA: Diagnosis present

## 2019-10-16 DIAGNOSIS — D62 Acute posthemorrhagic anemia: Secondary | ICD-10-CM | POA: Diagnosis present

## 2019-10-16 DIAGNOSIS — R1310 Dysphagia, unspecified: Secondary | ICD-10-CM

## 2019-10-16 DIAGNOSIS — E119 Type 2 diabetes mellitus without complications: Secondary | ICD-10-CM | POA: Diagnosis present

## 2019-10-16 DIAGNOSIS — D72829 Elevated white blood cell count, unspecified: Secondary | ICD-10-CM

## 2019-10-16 DIAGNOSIS — D6481 Anemia due to antineoplastic chemotherapy: Secondary | ICD-10-CM | POA: Diagnosis present

## 2019-10-16 DIAGNOSIS — D509 Iron deficiency anemia, unspecified: Secondary | ICD-10-CM | POA: Diagnosis present

## 2019-10-16 DIAGNOSIS — C169 Malignant neoplasm of stomach, unspecified: Secondary | ICD-10-CM

## 2019-10-16 DIAGNOSIS — C787 Secondary malignant neoplasm of liver and intrahepatic bile duct: Secondary | ICD-10-CM | POA: Diagnosis present

## 2019-10-16 DIAGNOSIS — C160 Malignant neoplasm of cardia: Principal | ICD-10-CM | POA: Diagnosis present

## 2019-10-16 DIAGNOSIS — D649 Anemia, unspecified: Secondary | ICD-10-CM | POA: Diagnosis present

## 2019-10-16 DIAGNOSIS — E871 Hypo-osmolality and hyponatremia: Secondary | ICD-10-CM | POA: Diagnosis present

## 2019-10-16 DIAGNOSIS — K1230 Oral mucositis (ulcerative), unspecified: Secondary | ICD-10-CM | POA: Diagnosis present

## 2019-10-16 LAB — ABO/RH: ABO/RH(D): O POS

## 2019-10-16 LAB — CBC WITH DIFFERENTIAL/PLATELET
Abs Immature Granulocytes: 0.32 10*3/uL — ABNORMAL HIGH (ref 0.00–0.07)
Basophils Absolute: 0.1 10*3/uL (ref 0.0–0.1)
Basophils Relative: 0 %
Eosinophils Absolute: 0 10*3/uL (ref 0.0–0.5)
Eosinophils Relative: 0 %
HCT: 18.7 % — ABNORMAL LOW (ref 39.0–52.0)
Hemoglobin: 5.9 g/dL — CL (ref 13.0–17.0)
Immature Granulocytes: 1 %
Lymphocytes Relative: 4 %
Lymphs Abs: 0.9 10*3/uL (ref 0.7–4.0)
MCH: 28 pg (ref 26.0–34.0)
MCHC: 31.6 g/dL (ref 30.0–36.0)
MCV: 88.6 fL (ref 80.0–100.0)
Monocytes Absolute: 0.7 10*3/uL (ref 0.1–1.0)
Monocytes Relative: 3 %
Neutro Abs: 22.4 10*3/uL — ABNORMAL HIGH (ref 1.7–7.7)
Neutrophils Relative %: 92 %
Platelets: 434 10*3/uL — ABNORMAL HIGH (ref 150–400)
RBC: 2.11 MIL/uL — ABNORMAL LOW (ref 4.22–5.81)
RDW: 19.9 % — ABNORMAL HIGH (ref 11.5–15.5)
WBC: 24.4 10*3/uL — ABNORMAL HIGH (ref 4.0–10.5)
nRBC: 0 % (ref 0.0–0.2)

## 2019-10-16 LAB — BASIC METABOLIC PANEL
Anion gap: 11 (ref 5–15)
BUN: 19 mg/dL (ref 8–23)
CO2: 23 mmol/L (ref 22–32)
Calcium: 8.1 mg/dL — ABNORMAL LOW (ref 8.9–10.3)
Chloride: 98 mmol/L (ref 98–111)
Creatinine, Ser: 0.47 mg/dL — ABNORMAL LOW (ref 0.61–1.24)
GFR calc Af Amer: >60 mL/min (ref >60)
GFR calc non Af Amer: >60 mL/min (ref >60)
Glucose, Bld: 142 mg/dL — ABNORMAL HIGH (ref 70–99)
Potassium: 4.1 mmol/L (ref 3.5–5.1)
Sodium: 132 mmol/L — ABNORMAL LOW (ref 135–145)

## 2019-10-16 LAB — SARS CORONAVIRUS 2 BY RT PCR (HOSPITAL ORDER, PERFORMED IN ~~LOC~~ HOSPITAL LAB): SARS Coronavirus 2: NEGATIVE

## 2019-10-16 LAB — POC OCCULT BLOOD, ED: Fecal Occult Bld: NEGATIVE

## 2019-10-16 LAB — GLUCOSE, CAPILLARY
Glucose-Capillary: 107 mg/dL — ABNORMAL HIGH (ref 70–99)
Glucose-Capillary: 108 mg/dL — ABNORMAL HIGH (ref 70–99)

## 2019-10-16 LAB — PREPARE RBC (CROSSMATCH)

## 2019-10-16 MED ORDER — OXYCODONE HCL 5 MG PO TABS: 5.0000 mg | ORAL_TABLET | Freq: Four times a day (QID) | ORAL | Status: DC | PRN

## 2019-10-16 MED ORDER — PREDNISONE 10 MG PO TABS
10.0000 mg | ORAL_TABLET | Freq: Every day | ORAL | Status: DC
  Administered 2019-10-17 – 2019-10-19 (×3): 10 mg via ORAL
  Filled 2019-10-16 (×3): qty 1

## 2019-10-16 MED ORDER — SENNOSIDES-DOCUSATE SODIUM 8.6-50 MG PO TABS: 1.0000 | ORAL_TABLET | Freq: Every evening | ORAL | Status: DC | PRN

## 2019-10-16 MED ORDER — ONDANSETRON HCL 4 MG PO TABS: 4.0000 mg | ORAL_TABLET | Freq: Four times a day (QID) | ORAL | Status: DC | PRN

## 2019-10-16 MED ORDER — PANTOPRAZOLE SODIUM 40 MG IV SOLR
40.0000 mg | Freq: Two times a day (BID) | INTRAVENOUS | Status: DC
  Administered 2019-10-17 – 2019-10-19 (×6): 40 mg via INTRAVENOUS
  Filled 2019-10-16 (×5): qty 40

## 2019-10-16 MED ORDER — INSULIN ASPART 100 UNIT/ML ~~LOC~~ SOLN
0.0000 [IU] | Freq: Three times a day (TID) | SUBCUTANEOUS | Status: DC
  Administered 2019-10-17: 2 [IU] via SUBCUTANEOUS
  Administered 2019-10-18 – 2019-10-19 (×2): 1 [IU] via SUBCUTANEOUS

## 2019-10-16 MED ORDER — MAGIC MOUTHWASH
5.0000 mL | Freq: Four times a day (QID) | ORAL | Status: DC | PRN
  Filled 2019-10-16: qty 5

## 2019-10-16 MED ORDER — ACETAMINOPHEN 325 MG PO TABS: 650.0000 mg | ORAL_TABLET | Freq: Four times a day (QID) | ORAL | Status: DC | PRN

## 2019-10-16 MED ORDER — ACETAMINOPHEN 650 MG RE SUPP: 650.0000 mg | Freq: Four times a day (QID) | RECTAL | Status: DC | PRN

## 2019-10-16 MED ORDER — PANTOPRAZOLE SODIUM 40 MG IV SOLR
40.0000 mg | Freq: Once | INTRAVENOUS | Status: AC
  Administered 2019-10-16: 40 mg via INTRAVENOUS
  Filled 2019-10-16: qty 40

## 2019-10-16 MED ORDER — PROCHLORPERAZINE MALEATE 10 MG PO TABS: 10.0000 mg | ORAL_TABLET | Freq: Four times a day (QID) | ORAL | Status: DC | PRN

## 2019-10-16 MED ORDER — ENSURE ENLIVE PO LIQD
237.0000 mL | Freq: Two times a day (BID) | ORAL | Status: DC
  Administered 2019-10-17: 237 mL via ORAL

## 2019-10-16 MED ORDER — SODIUM CHLORIDE 0.9 % IV SOLN: 10.0000 mL/h | Freq: Once | INTRAVENOUS | Status: DC

## 2019-10-16 MED ORDER — INSULIN ASPART 100 UNIT/ML ~~LOC~~ SOLN: 0.0000 [IU] | Freq: Every day | SUBCUTANEOUS | Status: DC

## 2019-10-16 MED ORDER — SODIUM CHLORIDE 0.9 % IV BOLUS
500.0000 mL | Freq: Once | INTRAVENOUS | Status: AC
  Administered 2019-10-16: 500 mL via INTRAVENOUS

## 2019-10-16 MED ORDER — ONDANSETRON HCL 4 MG/2ML IJ SOLN: 4.0000 mg | Freq: Four times a day (QID) | INTRAMUSCULAR | Status: DC | PRN

## 2019-10-16 NOTE — ED Notes (Signed)
Family at bedside. 

## 2019-10-16 NOTE — H&P (View-Only) (Signed)
Referring Provider:  [facility]         Primary Care Physician:  Patient, No Pcp Per Primary Gastroenterologist:    Althia Forts          We were asked to see this patient for:     Possible GI bleed             ASSESSMENT /  PLAN    # Metastatic gastric adenocarcinoma.diagnosed 4-21 in [place] --Per oncology records which they received from [place].  EGD 08/15/2019 showed a large ulcerated lesion in the gastric cardia immediately below the GE junction, malignant appearing. --Biopsy-adenocarcinoma, poorly differentiated --Recently relocated from [place].  Now under care of Dr.Sherill.  Cycle 2 FOLFOX plus nivolumab 09/24/2019, Udenyca added, 5-FU dose reduced  # Severe normocytic anemia --Probably, combination of chemotherapy and GI blood loss secondary to known gastric cancer.  Last week reportedly had black stool and ? Hematemesis.  --Nice response to transfusion.  Hemoglobin up from 5.9-8.9 today.  --No stools in several days. --In the absence of active bleeding we will not pursue EGD at this time.  If he does start bleeding then will likely need to get IR involved as the bleeding would most certainly be related to the malignant gastric lesion    # Solid food dysphagia.  --Present even prior to starting chemotherapy.  Still could have some degree of mucositis/Candida esophagitis --We will obtain an esophagram.  If narrowing / stricture then patient may need EGD --Hopefully esophagram can be done today.  Will make n.p.o. after midnight for tentative EGD  HPI:    Chief Complaint:  GI bleed  Alexander Lawson is a 74 y.o. male , non-English speaking male who was diagnosed with gastric cancer in April of this year.  Patient had just recently relocated from [place] to [place] and not started any chemotherapy.   Gastric mass biopsy done in [place] 09-03-19 remarkable for adenocarcinoma, poorly differentiated  08/25/2019 ED visit for abdominal pain and weight loss.  WBC at that  time was 8.7, hemoglobin 12.5.  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 This mass is located between the liver, stomach and pancreas but is not favored to arise from the pancreas. Gas within the lesion may be secondary necrosis or communication with the gastric lumen. 2. Multiple hepatic metastases. Numerous prominent lymph nodes in the upper abdomen, not pathologically enlarged. 3. No evidence of bowel obstruction or perforation. 4. Aortic Atherosclerosis (ICD10-I70.0   Patient established care with Oncology in early April. Completed cycle 1 of FOLFOX 09/04/2019.  Developed mucositis, dehydration.  Required IV fluids 09/12/19.   09/17/2019 -09/19/2019.  Admitted with weakness and fevers.  Chest CT scan suggested consolidation versus aspiration.  Treated with broad-spectrum antibiotics.  Noted to be anemic, possibly secondary to chemotherapy. emoglobin that admission was 9.3 it has been slowly drifting from 12.5 in early April.Marland Kitchen He was FOBT positive but GI consultation not pursued given known gastric malignancy.    10/03/19 Follow up labs - Hgb 9.4  10/08/19 Follow up labs - Hgb 8.0  10/16/19 ED visit with pain in back and shoulders.  Feeling weak, ? Syncopal episode.  Daughter reported dark stools over the past month.  Episode of vomiting a few days ago which may have contained blood.  Patient was hemodynamically stable.  Hemoglobin was 5.9, BUN normal at 14.  AST 62, ALT 13, total bilirubin 0.7,  alkaline phosphatase 184.   Interval History:   History obtained with assistance of [place] Administrator, sports. Daughters are in room and help with history. The patient had 3 days of dark stool at home several days ago . He had started oral iron a few weeks back, stopped to about 1.5 weeks ago. Along with the black stool several days ago he also had nausea and vomiting of  brown / black liquid. He did have some associated upper abdominal pain with the nausea/ vomiting but none since.  He suffers with constipation at home.  Patient's main concern is that of diminished appetite and solid food dysphagia. Solid food feel like they have been getting stuck in "throat" for two months ( even before starting chemo).  Rice is particularly bothersome . No odynophagia. He was taking magic mouthwash at home.   Patient received 2 units of blood yesterday.  His hemoglobin has improved from 5.9-8.9 this a.m..  No BMs / blood in several days.    Past Medical History:  Diagnosis Date  . Cancer ([place])    pancreas  . Diabetes mellitus without complication [facility])     Past Surgical History:  Procedure Laterality Date  . IR IMAGING GUIDED PORT INSERTION  09/03/2019    Prior to Admission medications   Medication Sig Start Date End Date Taking? Authorizing Provider  diclofenac Sodium (VOLTAREN) 1 % GEL Apply 2 g topically 4 (four) times daily as needed (pain).   Yes [provider]  insulin NPH Human (NOVOLIN N) 100 UNIT/ML injection Inject 5-10 Units into the skin daily as needed (5 units if BS over 150/10 units if BS over 200).   Yes [provider]  lidocaine-prilocaine (EMLA) cream Apply 1 application topically as directed. Apply to port site 1 hour prior to stick and cover with plastic wrap. Start with 2nd chemo treatment 09/02/19  Yes Ladell Pier, MD  magic mouthwash SOLN Take 5 mLs by mouth 4 (four) times daily as needed for mouth pain (swish and spit). 09/10/19  Yes Ladell Pier, MD  oxyCODONE (ROXICODONE) 5 MG immediate release tablet Take 1 tablet (5 mg total) by mouth every 6 (six) hours as needed for severe pain. 10/08/19  Yes Owens Shark, NP  predniSONE (DELTASONE) 10 MG tablet Take 1 tablet (10 mg total) by mouth daily with breakfast. 10/08/19  Yes Owens Shark, NP  prochlorperazine (COMPAZINE) 10 MG tablet Take 1 tablet (10 mg total) by mouth  every 6 (six) hours as needed for nausea. 09/02/19  Yes Ladell Pier, MD  ferrous sulfate 325 (65 FE) MG tablet Take 1 tablet (325 mg total) by mouth daily. Patient not taking: Reported on 10/16/2019 09/19/19 11/18/19  Shelly Coss, MD  polyethylene glycol (MIRALAX / GLYCOLAX) 17 g packet Take 17 g by mouth daily. Patient not taking: Reported on 09/25/2019 09/20/19   Shelly Coss, MD    Current Facility-Administered Medications  Medication Dose Route Frequency Provider Last Rate Last Admin  . 0.9 %  sodium chloride infusion  10 mL/hr Intravenous Once Lucrezia Starch, MD      . acetaminophen (TYLENOL) tablet 650 mg  650 mg Oral Q6H PRN Little Ishikawa, MD       Or  . acetaminophen (TYLENOL) suppository 650 mg  650 mg Rectal Q6H PRN Little Ishikawa, MD      . Derrill Memo ON 10/17/2019] feeding supplement (ENSURE ENLIVE) (ENSURE ENLIVE) liquid 237 mL  237 mL Oral BID BM Holli Humbles  C, MD      . insulin aspart (novoLOG) injection 0-5 Units  0-5 Units Subcutaneous QHS Little Ishikawa, MD      . Derrill Memo ON 10/17/2019] insulin aspart (novoLOG) injection 0-6 Units  0-6 Units Subcutaneous TID WC Little Ishikawa, MD      . magic mouthwash  5 mL Oral QID PRN Little Ishikawa, MD      . ondansetron [facility]) tablet 4 mg  4 mg Oral Q6H PRN Little Ishikawa, MD       Or  . ondansetron [facility]) injection 4 mg  4 mg Intravenous Q6H PRN Little Ishikawa, MD      . oxyCODONE (Oxy IR/ROXICODONE) immediate release tablet 5 mg  5 mg Oral Q6H PRN Little Ishikawa, MD      . pantoprazole (PROTONIX) injection 40 mg  40 mg Intravenous Q12H Little Ishikawa, MD      . Derrill Memo ON 10/17/2019] predniSONE (DELTASONE) tablet 10 mg  10 mg Oral Q breakfast Little Ishikawa, MD      . prochlorperazine (COMPAZINE) tablet 10 mg  10 mg Oral Q6H PRN Little Ishikawa, MD      . senna-docusate (Senokot-S) tablet 1 tablet  1 tablet Oral QHS PRN Little Ishikawa, MD        Allergies  as of 10/16/2019  . (No Known Allergies)    Family History  Problem Relation Age of Onset  . Asthma Mother     Social History   Socioeconomic History  . Marital status: Widowed    Spouse name: Not on file  . Number of children: Not on file  . Years of education: Not on file  . Highest education level: Not on file  Occupational History  . Not on file  Tobacco Use  . Smoking status: Former Research scientist (life sciences)  . Smokeless tobacco: Never Used  Substance and Sexual Activity  . Alcohol use: Not Currently  . Drug use: Not Currently  . Sexual activity: Not on file  Other Topics Concern  . Not on file  Social History Narrative  . Not on file   Social Determinants of Health   Financial Resource Strain:   . Difficulty of Paying Living Expenses:   Food Insecurity:   . Worried About Charity fundraiser in the Last Year:   . Arboriculturist in the Last Year:   Transportation Needs:   . Film/video editor (Medical):   Marland Kitchen Lack of Transportation (Non-Medical):   Physical Activity:   . Days of Exercise per Week:   . Minutes of Exercise per Session:   Stress:   . Feeling of Stress :   Social Connections:   . Frequency of Communication with Friends and Family:   . Frequency of Social Gatherings with Friends and Family:   . Attends Religious Services:   . Active Member of Clubs or Organizations:   . Attends Archivist Meetings:   Marland Kitchen Marital Status:   Intimate Partner Violence:   . Fear of Current or Ex-Partner:   . Emotionally Abused:   Marland Kitchen Physically Abused:   . Sexually Abused:     Review of Systems: All systems reviewed and negative except where noted in HPI.  Physical Exam: Vital signs in last 24 hours: Temp:  [97.7 F (36.5 C)-98.3 F (36.8 C)] 97.7 F (36.5 C) (06/03 1650) Pulse Rate:  [83-93] 83 (06/03 1650) Resp:  [13-17] 17 (06/03 1650) BP: (93-112)/(59-74) 96/59 (06/03 1650)  SpO2:  [100 %] 100 % (06/03 1528)   General:   Alert, thin male in NAD Psych:   Pleasant, cooperative. Normal mood and affect. Eyes:  Pupils equal, sclera clear, no icterus.   Conjunctiva pink. Ears:  Normal auditory acuity. Nose:  No deformity, discharge,  or lesions Mouth: no obvious candida. Neck:  Supple; no masses Lungs:  Clear throughout to auscultation.   No wheezes, crackles, or rhonchi.  Heart:  Regular rate and rhythm;  no lower extremity edema Abdomen:  Soft, non-distended, nontender, BS active, no palp mass   Rectal:  Deferred  Msk:  Symmetrical without gross deformities. . Neurologic:  Alert and  oriented x4;  grossly normal neurologically. Skin:  Intact without significant lesions or rashes.   Intake/Output from previous day: No intake/output data recorded. Intake/Output this shift: Total I/O In: 1465 [I.V.:1000; Blood:315; IV Piggyback:150] Out: -   Lab Results: Recent Labs    10/16/19 1216  WBC 24.4*  HGB 5.9*  HCT 18.7*  PLT 434*   BMET Recent Labs    10/16/19 1216  NA 132*  K 4.1  CL 98  CO2 23  GLUCOSE 142*  BUN 19  CREATININE 0.47*  CALCIUM 8.1*   LFT No results for input(s): PROT, ALBUMIN, AST, ALT, ALKPHOS, BILITOT, BILIDIR, IBILI in the last 72 hours. PT/INR No results for input(s): LABPROT, INR in the last 72 hours. Hepatitis Panel No results for input(s): HEPBSAG, HCVAB, HEPAIGM, HEPBIGM in the last 72 hours.   . CBC Latest Ref Rng & Units 10/16/2019 10/08/2019 10/03/2019  WBC 4.0 - 10.5 K/uL 24.4(H) 36.2(H) 34.3(H)  Hemoglobin 13.0 - 17.0 g/dL 5.9(LL) 8.0(L) 9.4(L)  Hematocrit 39.0 - 52.0 % 18.7(L) 24.0(L) 28.9(L)  Platelets 150 - 400 K/uL 434(H) 423(H) 470(H)    . CMP Latest Ref Rng & Units 10/16/2019 10/08/2019 10/03/2019  Glucose 70 - 99 mg/dL 142(H) 117(H) 102(H)  BUN 8 - 23 mg/dL 19 13 22   Creatinine 0.61 - 1.24 mg/dL 0.47(L) 0.62 0.55(L)  Sodium 135 - 145 mmol/L 132(L) 132(L) 131(L)  Potassium 3.5 - 5.1 mmol/L 4.1 4.3 4.5  Chloride 98 - 111 mmol/L 98 96(L) 97(L)  CO2 22 - 32 mmol/L 23 27 23   Calcium 8.9  - 10.3 mg/dL 8.1(L) 8.9 8.7(L)  Total Protein 6.5 - 8.1 g/dL - 6.4(L) 6.5  Total Bilirubin 0.3 - 1.2 mg/dL - 0.3 0.6  Alkaline Phos 38 - 126 U/L - 190(H) 172(H)  AST 15 - 41 U/L - 44(H) 41  ALT 0 - 44 U/L - 10 13   Studies/Results: DG Chest Portable 1 View  Result Date: 10/16/2019 CLINICAL DATA:  Syncope EXAM: PORTABLE CHEST 1 VIEW COMPARISON:  Sep 16, 2019 FINDINGS: Port-A-Cath tip is in the superior vena cava at the cavoatrial junction. No pneumothorax. The there is a granuloma in the left base. The lungs elsewhere are clear. Heart size and pulmonary vascularity are normal. There is a calcified lymph node at the aortopulmonary window. No adenopathy evident. No bone lesions. IMPRESSION: Calcified granulomas. No edema or airspace opacity. Cardiac silhouette normal. Port-A-Cath tip at cavoatrial junction. No pneumothorax. Electronically Signed   By: Lowella Grip III M.D.   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 I agree with the Advanced Practitioner's note, impression and recommendations.  74 year old male with history of metastatic gastric adenocarcinoma diagnosed in April 2021 in [place], currently undergoing palliative chemo therapy admitted with severe anemia  Hemoglobin 5.9 on admission, responded appropriately to PRBC transfusion and improved to 8.9 Leukocytosis and thrombocytosis  Subacute blood loss likely secondary to known gastric adenocarcinoma and also s/p chemotherapy, received 2 cycles of FOLFOX plus nivolumab  Bleeding/oozing from malignant mass lesion is usually not amenable to endoscopic therapy Monitor hemoglobin and transfuse as  needed  Complaints of dysphagia, will request upper GI series to investigate it further, exclude obstruction of EG junction from the gastric cardia mass.  Empirically treat with fluconazole, has candidiasis in posterior pharynx  We will consider EGD for therapeutic intervention if needed based on upper GI series findings  GI will continue to follow along     K. Denzil Magnuson , MD [phone]

## 2019-10-16 NOTE — ED Notes (Signed)
[facility] interpreter used for triageTerrial Lawson [phone]  Pt reports pain in back and in shoulders. Pt reports feeling weak after getting Covid shot, felt dizziness in my head and then.....  Pt fell from chair.

## 2019-10-16 NOTE — Progress Notes (Signed)
At bedside

## 2019-10-16 NOTE — Progress Notes (Signed)
   Covid-19 Vaccination Clinic  Name:  Alexander Lawson    MRN: [id number] DOB: 01-Apr-1946  10/16/2019  Alexander Lawson was observed post Covid-19 immunization for 15 minutes without incident. He was provided with Vaccine Information Sheet and instruction to access the V-Safe system.   Alexander Lawson was instructed to call 911 with any severe reactions post vaccine: Marland Kitchen Difficulty breathing  . Swelling of face and throat  . A fast heartbeat  . A bad rash all over body  . Dizziness and weakness   Immunizations Administered    Name Date Dose VIS Date Route   Pfizer COVID-19 Vaccine 10/16/2019 10:02 AM 0.3 mL 07/09/2018 Intramuscular   Manufacturer: [employer], [employer]   Lot: [id number]   [place]: [id number]

## 2019-10-16 NOTE — ED Triage Notes (Addendum)
Pt BIBA from home.   Pt spanish speaking only.   Per EMS- Pt received 1/2 covid vaccines today (pfizer) appx 30 min prior to EMS arrival. Family reports pt had just returned home when he had witnessed syncopal event, incontinent of urine.  Unconscious appx 5 min. Family reports pt mentating at baseline, hx of confusion. Denies head trauma, denies blood thinners.   Pt with hx of stage 4 stomach.   EMS gave 1000 mL NaCl.   18 g L FA 108/66 88 HR 16 R 100% RA 191 CBG

## 2019-10-16 NOTE — ED Provider Notes (Addendum)
[facility] DEPT Provider Note   CSN: [id number] Arrival date & time: 10/16/19  1141     History Chief Complaint  Patient presents with  . Loss of Consciousness    Alexander Lawson is a 74 y.o. male.  Presented to ER with concern for syncopal episode.  Patient received first dose of Covid vaccine this morning.  Pfizer.  Patient had just gotten home when he reports that he felt lightheaded and passed out.  States that went down slowly, did not hit head, does not have any headache.  EMS reported incontinence of urine.  There was no report of seizure activity.  No reported postictal period.  Patient denies any associated chest pain or difficulty breathing.  Additional history obtained via chart review, past medical history of history of metastatic gastric cancer and is undergoing chemotherapy.  Additional history obtained via history from daughter, reports having dark stools over the past month, few days ago had an episode of vomiting and may have had small blood.  No vomiting recently since.  Used Quarry manager.  HPI     Past Medical History:  Diagnosis Date  . Cancer ([place])    pancreas  . Diabetes mellitus without complication [facility])     Patient Active Problem List   Diagnosis Date Noted  . Symptomatic anemia 10/16/2019  . Aspiration pneumonia ([place]) 09/17/2019  . Sepsis ([place]) 09/17/2019  . Neutropenia with fever ([place]) 09/17/2019  . Anemia associated with chemotherapy 09/17/2019  . 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 Gastric cancer ([place]) 08/29/2019  . Goals of care, counseling/discussion 08/29/2019    Past Surgical History:  Procedure Laterality Date  . IR IMAGING GUIDED PORT INSERTION  09/03/2019       Family History  Problem Relation Age of Onset  . Asthma Mother     Social History   Tobacco Use  . Smoking status: Former Research scientist (life sciences)  . Smokeless tobacco: Never Used  Substance Use Topics  .  Alcohol use: Not Currently  . Drug use: Not Currently    Home Medications Prior to Admission medications   Medication Sig Start Date End Date Taking? Authorizing Provider  diclofenac Sodium (VOLTAREN) 1 % GEL Apply 2 g topically 4 (four) times daily as needed (pain).   Yes [provider]  insulin NPH Human (NOVOLIN N) 100 UNIT/ML injection Inject 5-10 Units into the skin daily as needed (5 units if BS over 150/10 units if BS over 200).   Yes [provider]  lidocaine-prilocaine (EMLA) cream Apply 1 application topically as directed. Apply to port site 1 hour prior to stick and cover with plastic wrap. Start with 2nd chemo treatment 09/02/19  Yes Ladell Pier, MD  magic mouthwash SOLN Take 5 mLs by mouth 4 (four) times daily as needed for mouth pain (swish and spit). 09/10/19  Yes Ladell Pier, MD  oxyCODONE (ROXICODONE) 5 MG immediate release tablet Take 1 tablet (5 mg total) by mouth every 6 (six) hours as needed for severe pain. 10/08/19  Yes Owens Shark, NP  predniSONE (DELTASONE) 10 MG tablet Take 1 tablet (10 mg total) by mouth daily with breakfast. 10/08/19  Yes Owens Shark, NP  prochlorperazine (COMPAZINE) 10 MG tablet Take 1 tablet (10 mg total) by mouth every 6 (six) hours as needed for nausea. 09/02/19  Yes Ladell Pier, MD  ferrous sulfate 325 (65 FE) MG tablet Take 1 tablet (325 mg total) by mouth daily.  Patient not taking: Reported on 10/16/2019 09/19/19 11/18/19  Shelly Coss, MD  polyethylene glycol (MIRALAX / GLYCOLAX) 17 g packet Take 17 g by mouth daily. Patient not taking: Reported on 09/25/2019 09/20/19   Shelly Coss, MD    Allergies    Patient has no known allergies.  Review of Systems   Review of Systems  Constitutional: Negative for chills and fever.  HENT: Negative for ear pain and sore throat.   Eyes: Negative for pain and visual disturbance.  Respiratory: Negative for cough and shortness of breath.   Cardiovascular: Negative for  chest pain and palpitations.  Gastrointestinal: Negative for abdominal pain and vomiting.  Genitourinary: Negative for dysuria and hematuria.  Musculoskeletal: Negative for arthralgias and back pain.  Skin: Negative for color change and rash.  Neurological: Positive for syncope. Negative for seizures.  All other systems reviewed and are negative.   Physical Exam Updated Vital Signs BP 106/69   Pulse 88   Temp 97.8 F (36.6 C) (Oral)   Resp 15   SpO2 100%   Physical Exam Vitals and nursing note reviewed. Exam conducted with a chaperone present.  Constitutional:      Appearance: He is well-developed.  HENT:     Head: Normocephalic and atraumatic.  Eyes:     Comments: Pale conjunctiva  Cardiovascular:     Rate and Rhythm: Normal rate and regular rhythm.     Heart sounds: No murmur.  Pulmonary:     Effort: Pulmonary effort is normal. No respiratory distress.     Breath sounds: Normal breath sounds.  Abdominal:     Palpations: Abdomen is soft.     Tenderness: There is no abdominal tenderness.  Genitourinary:    Comments: Normal-appearing rectum, brown stool Musculoskeletal:        General: No deformity or signs of injury.     Cervical back: Neck supple.  Skin:    General: Skin is warm and dry.     Capillary Refill: Capillary refill takes less than 2 seconds.  Neurological:     General: No focal deficit present.     Mental Status: He is alert and oriented to person, place, and time.  Psychiatric:        Mood and Affect: Mood normal.        Behavior: Behavior normal.     ED Results / Procedures / Treatments   Labs (all labs ordered are listed, but only abnormal results are displayed) Labs Reviewed  CBC WITH DIFFERENTIAL/PLATELET - Abnormal; Notable for the following components:      Result Value   WBC 24.4 (*)    RBC 2.11 (*)    Hemoglobin 5.9 (*)    HCT 18.7 (*)    RDW 19.9 (*)    Platelets 434 (*)    Neutro Abs 22.4 (*)    Abs Immature Granulocytes 0.32 (*)     All other components within normal limits  BASIC METABOLIC PANEL - Abnormal; Notable for the following components:   Sodium 132 (*)    Glucose, Bld 142 (*)    Creatinine, Ser 0.47 (*)    Calcium 8.1 (*)    All other components within normal limits  SARS CORONAVIRUS 2 BY RT PCR (HOSPITAL ORDER, [facility])  POC OCCULT BLOOD, ED  PREPARE RBC (CROSSMATCH)  TYPE AND SCREEN  ABO/RH    EKG EKG Interpretation  Date/Time:  Thursday October 16 2019 12:20:32 EDT Ventricular Rate:  89 PR Interval:  QRS Duration: 96 QT Interval:  360 QTC Calculation: 438 R Axis:   35 Text Interpretation: Sinus rhythm Confirmed by Madalyn Rob [id number] on 10/16/2019 3:49:22 PM   Radiology DG Chest Portable 1 View  Result Date: 10/16/2019 CLINICAL DATA:  Syncope EXAM: PORTABLE CHEST 1 VIEW COMPARISON:  Sep 16, 2019 FINDINGS: Port-A-Cath tip is in the superior vena cava at the cavoatrial junction. No pneumothorax. The there is a granuloma in the left base. The lungs elsewhere are clear. Heart size and pulmonary vascularity are normal. There is a calcified lymph node at the aortopulmonary window. No adenopathy evident. No bone lesions. IMPRESSION: Calcified granulomas. No edema or airspace opacity. Cardiac silhouette normal. Port-A-Cath tip at cavoatrial junction. No pneumothorax. Electronically Signed   By: Lowella Grip III M.D.   On: 10/16/2019 13:25    Procedures .Critical Care Performed by: Lucrezia Starch, MD Authorized by: Lucrezia Starch, MD   Critical care provider statement:    Critical care time (minutes):  36   Critical care was necessary to treat or prevent imminent or life-threatening deterioration of the following conditions: anemia.   Critical care was time spent personally by me on the following activities:  Discussions with consultants, evaluation of patient's response to treatment, examination of patient, ordering and performing treatments and  interventions, ordering and review of laboratory studies, ordering and review of radiographic studies, pulse oximetry, re-evaluation of patient's condition, obtaining history from patient or surrogate and review of old charts   (including critical care time)  Medications Ordered in ED Medications  0.9 %  sodium chloride infusion (has no administration in time range)  sodium chloride 0.9 % bolus 500 mL (0 mLs Intravenous Stopped 10/16/19 1431)  pantoprazole (PROTONIX) injection 40 mg (40 mg Intravenous Given 10/16/19 1521)    ED Course  I have reviewed the triage vital signs and the nursing notes.  Pertinent labs & imaging results that were available during my care of the patient were reviewed by me and considered in my medical decision making (see chart for details).  Clinical Course as of Oct 16 1615  Thu Oct 16, 2019  [address]   [RD]  1549 D/w [facility] who will accept   [RD]    Clinical Course User Index [RD] Lucrezia Starch, MD   MDM Rules/Calculators/A&P                      74 year old male metastatic gastric cancer on chemotherapy presenting to ER after syncopal episode.  On exam patient was chronically ill-appearing but not in distress.  Blood pressure is at baseline which is 90s to low 100s.  Work-up concerning for significant drop in hemoglobin to 5.9.  Had reported dark stools but today has brown stool, Hemoccult negative.  Other differential would be related to chemotherapy.  Attempted to contact both gastroenterology and oncology however did not get call back. Secretary to try repaging.  Contacted hospitalist for admission and gave patient 2 units of blood. WIll need further monitoring and trending of hemoglobin.  Dr. Avon Gully accepting.  Final Clinical Impression(s) / ED Diagnoses Final diagnoses:  Syncope and collapse  Anemia, unspecified type    Rx / DC Orders ED Discharge Orders    None       Lucrezia Starch, MD 10/16/19  1617    Lucrezia Starch, MD 10/17/19 [phone]

## 2019-10-16 NOTE — Consult Note (Addendum)
Referring Provider:  [facility]         Primary Care Physician:  Patient, No Pcp Per Primary Gastroenterologist:    Althia Forts          We were asked to see this patient for:     Possible GI bleed             ASSESSMENT /  PLAN    # Metastatic gastric adenocarcinoma.diagnosed 4-21 in [place] --Per oncology records which they received from [place].  EGD 08/15/2019 showed a large ulcerated lesion in the gastric cardia immediately below the GE junction, malignant appearing. --Biopsy-adenocarcinoma, poorly differentiated --Recently relocated from [place].  Now under care of Dr.Sherill.  Cycle 2 FOLFOX plus nivolumab 09/24/2019, Udenyca added, 5-FU dose reduced  # Severe normocytic anemia --Probably, combination of chemotherapy and GI blood loss secondary to known gastric cancer.  Last week reportedly had black stool and ? Hematemesis.  --Nice response to transfusion.  Hemoglobin up from 5.9-8.9 today.  --No stools in several days. --In the absence of active bleeding we will not pursue EGD at this time.  If he does start bleeding then will likely need to get IR involved as the bleeding would most certainly be related to the malignant gastric lesion    # Solid food dysphagia.  --Present even prior to starting chemotherapy.  Still could have some degree of mucositis/Candida esophagitis --We will obtain an esophagram.  If narrowing / stricture then patient may need EGD --Hopefully esophagram can be done today.  Will make n.p.o. after midnight for tentative EGD  HPI:    Chief Complaint:  GI bleed  Alexander Lawson is a 74 y.o. male , non-English speaking male who was diagnosed with gastric cancer in April of this year.  Patient had just recently relocated from [place] to [place] and not started any chemotherapy.   Gastric mass biopsy done in [place] 09-03-19 remarkable for adenocarcinoma, poorly differentiated  08/25/2019 ED visit for abdominal pain and weight loss.  WBC at that  time was 8.7, hemoglobin 12.5.  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 This mass is located between the liver, stomach and pancreas but is not favored to arise from the pancreas. Gas within the lesion may be secondary necrosis or communication with the gastric lumen. 2. Multiple hepatic metastases. Numerous prominent lymph nodes in the upper abdomen, not pathologically enlarged. 3. No evidence of bowel obstruction or perforation. 4. Aortic Atherosclerosis (ICD10-I70.0   Patient established care with Oncology in early April. Completed cycle 1 of FOLFOX 09/04/2019.  Developed mucositis, dehydration.  Required IV fluids 09/12/19.   09/17/2019 -09/19/2019.  Admitted with weakness and fevers.  Chest CT scan suggested consolidation versus aspiration.  Treated with broad-spectrum antibiotics.  Noted to be anemic, possibly secondary to chemotherapy. emoglobin that admission was 9.3 it has been slowly drifting from 12.5 in early April.Marland Kitchen He was FOBT positive but GI consultation not pursued given known gastric malignancy.    10/03/19 Follow up labs - Hgb 9.4  10/08/19 Follow up labs - Hgb 8.0  10/16/19 ED visit with pain in back and shoulders.  Feeling weak, ? Syncopal episode.  Daughter reported dark stools over the past month.  Episode of vomiting a few days ago which may have contained blood.  Patient was hemodynamically stable.  Hemoglobin was 5.9, BUN normal at 14.  AST 62, ALT 13, total bilirubin 0.7,  alkaline phosphatase 184.   Interval History:   History obtained with assistance of [place] Administrator, sports. Daughters are in room and help with history. The patient had 3 days of dark stool at home several days ago . He had started oral iron a few weeks back, stopped to about 1.5 weeks ago. Along with the black stool several days ago he also had nausea and vomiting of  brown / black liquid. He did have some associated upper abdominal pain with the nausea/ vomiting but none since.  He suffers with constipation at home.  Patient's main concern is that of diminished appetite and solid food dysphagia. Solid food feel like they have been getting stuck in "throat" for two months ( even before starting chemo).  Rice is particularly bothersome . No odynophagia. He was taking magic mouthwash at home.   Patient received 2 units of blood yesterday.  His hemoglobin has improved from 5.9-8.9 this a.m..  No BMs / blood in several days.    Past Medical History:  Diagnosis Date  . Cancer ([place])    pancreas  . Diabetes mellitus without complication [facility])     Past Surgical History:  Procedure Laterality Date  . IR IMAGING GUIDED PORT INSERTION  09/03/2019    Prior to Admission medications   Medication Sig Start Date End Date Taking? Authorizing Provider  diclofenac Sodium (VOLTAREN) 1 % GEL Apply 2 g topically 4 (four) times daily as needed (pain).   Yes [provider]  insulin NPH Human (NOVOLIN N) 100 UNIT/ML injection Inject 5-10 Units into the skin daily as needed (5 units if BS over 150/10 units if BS over 200).   Yes [provider]  lidocaine-prilocaine (EMLA) cream Apply 1 application topically as directed. Apply to port site 1 hour prior to stick and cover with plastic wrap. Start with 2nd chemo treatment 09/02/19  Yes Ladell Pier, MD  magic mouthwash SOLN Take 5 mLs by mouth 4 (four) times daily as needed for mouth pain (swish and spit). 09/10/19  Yes Ladell Pier, MD  oxyCODONE (ROXICODONE) 5 MG immediate release tablet Take 1 tablet (5 mg total) by mouth every 6 (six) hours as needed for severe pain. 10/08/19  Yes Owens Shark, NP  predniSONE (DELTASONE) 10 MG tablet Take 1 tablet (10 mg total) by mouth daily with breakfast. 10/08/19  Yes Owens Shark, NP  prochlorperazine (COMPAZINE) 10 MG tablet Take 1 tablet (10 mg total) by mouth  every 6 (six) hours as needed for nausea. 09/02/19  Yes Ladell Pier, MD  ferrous sulfate 325 (65 FE) MG tablet Take 1 tablet (325 mg total) by mouth daily. Patient not taking: Reported on 10/16/2019 09/19/19 11/18/19  Shelly Coss, MD  polyethylene glycol (MIRALAX / GLYCOLAX) 17 g packet Take 17 g by mouth daily. Patient not taking: Reported on 09/25/2019 09/20/19   Shelly Coss, MD    Current Facility-Administered Medications  Medication Dose Route Frequency Provider Last Rate Last Admin  . 0.9 %  sodium chloride infusion  10 mL/hr Intravenous Once Lucrezia Starch, MD      . acetaminophen (TYLENOL) tablet 650 mg  650 mg Oral Q6H PRN Little Ishikawa, MD       Or  . acetaminophen (TYLENOL) suppository 650 mg  650 mg Rectal Q6H PRN Little Ishikawa, MD      . Derrill Memo ON 10/17/2019] feeding supplement (ENSURE ENLIVE) (ENSURE ENLIVE) liquid 237 mL  237 mL Oral BID BM Holli Humbles  C, MD      . insulin aspart (novoLOG) injection 0-5 Units  0-5 Units Subcutaneous QHS Little Ishikawa, MD      . Derrill Memo ON 10/17/2019] insulin aspart (novoLOG) injection 0-6 Units  0-6 Units Subcutaneous TID WC Little Ishikawa, MD      . magic mouthwash  5 mL Oral QID PRN Little Ishikawa, MD      . ondansetron [facility]) tablet 4 mg  4 mg Oral Q6H PRN Little Ishikawa, MD       Or  . ondansetron [facility]) injection 4 mg  4 mg Intravenous Q6H PRN Little Ishikawa, MD      . oxyCODONE (Oxy IR/ROXICODONE) immediate release tablet 5 mg  5 mg Oral Q6H PRN Little Ishikawa, MD      . pantoprazole (PROTONIX) injection 40 mg  40 mg Intravenous Q12H Little Ishikawa, MD      . Derrill Memo ON 10/17/2019] predniSONE (DELTASONE) tablet 10 mg  10 mg Oral Q breakfast Little Ishikawa, MD      . prochlorperazine (COMPAZINE) tablet 10 mg  10 mg Oral Q6H PRN Little Ishikawa, MD      . senna-docusate (Senokot-S) tablet 1 tablet  1 tablet Oral QHS PRN Little Ishikawa, MD        Allergies  as of 10/16/2019  . (No Known Allergies)    Family History  Problem Relation Age of Onset  . Asthma Mother     Social History   Socioeconomic History  . Marital status: Widowed    Spouse name: Not on file  . Number of children: Not on file  . Years of education: Not on file  . Highest education level: Not on file  Occupational History  . Not on file  Tobacco Use  . Smoking status: Former Research scientist (life sciences)  . Smokeless tobacco: Never Used  Substance and Sexual Activity  . Alcohol use: Not Currently  . Drug use: Not Currently  . Sexual activity: Not on file  Other Topics Concern  . Not on file  Social History Narrative  . Not on file   Social Determinants of Health   Financial Resource Strain:   . Difficulty of Paying Living Expenses:   Food Insecurity:   . Worried About Charity fundraiser in the Last Year:   . Arboriculturist in the Last Year:   Transportation Needs:   . Film/video editor (Medical):   Marland Kitchen Lack of Transportation (Non-Medical):   Physical Activity:   . Days of Exercise per Week:   . Minutes of Exercise per Session:   Stress:   . Feeling of Stress :   Social Connections:   . Frequency of Communication with Friends and Family:   . Frequency of Social Gatherings with Friends and Family:   . Attends Religious Services:   . Active Member of Clubs or Organizations:   . Attends Archivist Meetings:   Marland Kitchen Marital Status:   Intimate Partner Violence:   . Fear of Current or Ex-Partner:   . Emotionally Abused:   Marland Kitchen Physically Abused:   . Sexually Abused:     Review of Systems: All systems reviewed and negative except where noted in HPI.  Physical Exam: Vital signs in last 24 hours: Temp:  [97.7 F (36.5 C)-98.3 F (36.8 C)] 97.7 F (36.5 C) (06/03 1650) Pulse Rate:  [83-93] 83 (06/03 1650) Resp:  [13-17] 17 (06/03 1650) BP: (93-112)/(59-74) 96/59 (06/03 1650)  SpO2:  [100 %] 100 % (06/03 1528)   General:   Alert, thin male in NAD Psych:   Pleasant, cooperative. Normal mood and affect. Eyes:  Pupils equal, sclera clear, no icterus.   Conjunctiva pink. Ears:  Normal auditory acuity. Nose:  No deformity, discharge,  or lesions Mouth: no obvious candida. Neck:  Supple; no masses Lungs:  Clear throughout to auscultation.   No wheezes, crackles, or rhonchi.  Heart:  Regular rate and rhythm;  no lower extremity edema Abdomen:  Soft, non-distended, nontender, BS active, no palp mass   Rectal:  Deferred  Msk:  Symmetrical without gross deformities. . Neurologic:  Alert and  oriented x4;  grossly normal neurologically. Skin:  Intact without significant lesions or rashes.   Intake/Output from previous day: No intake/output data recorded. Intake/Output this shift: Total I/O In: 1465 [I.V.:1000; Blood:315; IV Piggyback:150] Out: -   Lab Results: Recent Labs    10/16/19 1216  WBC 24.4*  HGB 5.9*  HCT 18.7*  PLT 434*   BMET Recent Labs    10/16/19 1216  NA 132*  K 4.1  CL 98  CO2 23  GLUCOSE 142*  BUN 19  CREATININE 0.47*  CALCIUM 8.1*   LFT No results for input(s): PROT, ALBUMIN, AST, ALT, ALKPHOS, BILITOT, BILIDIR, IBILI in the last 72 hours. PT/INR No results for input(s): LABPROT, INR in the last 72 hours. Hepatitis Panel No results for input(s): HEPBSAG, HCVAB, HEPAIGM, HEPBIGM in the last 72 hours.   . CBC Latest Ref Rng & Units 10/16/2019 10/08/2019 10/03/2019  WBC 4.0 - 10.5 K/uL 24.4(H) 36.2(H) 34.3(H)  Hemoglobin 13.0 - 17.0 g/dL 5.9(LL) 8.0(L) 9.4(L)  Hematocrit 39.0 - 52.0 % 18.7(L) 24.0(L) 28.9(L)  Platelets 150 - 400 K/uL 434(H) 423(H) 470(H)    . CMP Latest Ref Rng & Units 10/16/2019 10/08/2019 10/03/2019  Glucose 70 - 99 mg/dL 142(H) 117(H) 102(H)  BUN 8 - 23 mg/dL 19 13 22   Creatinine 0.61 - 1.24 mg/dL 0.47(L) 0.62 0.55(L)  Sodium 135 - 145 mmol/L 132(L) 132(L) 131(L)  Potassium 3.5 - 5.1 mmol/L 4.1 4.3 4.5  Chloride 98 - 111 mmol/L 98 96(L) 97(L)  CO2 22 - 32 mmol/L 23 27 23   Calcium 8.9  - 10.3 mg/dL 8.1(L) 8.9 8.7(L)  Total Protein 6.5 - 8.1 g/dL - 6.4(L) 6.5  Total Bilirubin 0.3 - 1.2 mg/dL - 0.3 0.6  Alkaline Phos 38 - 126 U/L - 190(H) 172(H)  AST 15 - 41 U/L - 44(H) 41  ALT 0 - 44 U/L - 10 13   Studies/Results: DG Chest Portable 1 View  Result Date: 10/16/2019 CLINICAL DATA:  Syncope EXAM: PORTABLE CHEST 1 VIEW COMPARISON:  Sep 16, 2019 FINDINGS: Port-A-Cath tip is in the superior vena cava at the cavoatrial junction. No pneumothorax. The there is a granuloma in the left base. The lungs elsewhere are clear. Heart size and pulmonary vascularity are normal. There is a calcified lymph node at the aortopulmonary window. No adenopathy evident. No bone lesions. IMPRESSION: Calcified granulomas. No edema or airspace opacity. Cardiac silhouette normal. Port-A-Cath tip at cavoatrial junction. No pneumothorax. Electronically Signed   By: Lowella Grip III M.D.   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 I agree with the Advanced Practitioner's note, impression and recommendations.  74 year old male with history of metastatic gastric adenocarcinoma diagnosed in April 2021 in [place], currently undergoing palliative chemo therapy admitted with severe anemia  Hemoglobin 5.9 on admission, responded appropriately to PRBC transfusion and improved to 8.9 Leukocytosis and thrombocytosis  Subacute blood loss likely secondary to known gastric adenocarcinoma and also s/p chemotherapy, received 2 cycles of FOLFOX plus nivolumab  Bleeding/oozing from malignant mass lesion is usually not amenable to endoscopic therapy Monitor hemoglobin and transfuse as  needed  Complaints of dysphagia, will request upper GI series to investigate it further, exclude obstruction of EG junction from the gastric cardia mass.  Empirically treat with fluconazole, has candidiasis in posterior pharynx  We will consider EGD for therapeutic intervention if needed based on upper GI series findings  GI will continue to follow along     K. Denzil Magnuson , MD [phone]

## 2019-10-16 NOTE — H&P (Signed)
History and Physical    Alexander Lawson [id number] DOB: 07-14-1945 DOA: 10/16/2019  PCP: Patient, No Pcp Per   Chief Complaint: weakness/syncope  HPI: Alexander Lawson is a 74 y.o. male with medical history significant of Stage 4 metastatic gastric cancer - following with Dr. Benay Spice currently on Folfox. He presented for his covid vaccine today with his daughter and subsequently had a syncopal event after receiving his vaccine.  Reports he has been feeling well prior to this, somewhat fatigued but not all that much different than his normal baseline around chemotherapy.  Daughter at bedside and patient denies any recent travel, headache, fever, chills, nausea, diarrhea, constipation.  They did indicate one single episode of vomiting 3 to 4 days ago which was black in color.  Further discussion patient indicates black stool over the past few days to week as well, but denies any bright red blood per rectum or hematemesis  ED Course: In ED patient was noted to have marked anemia at 5.9, leukocytosis appears to be stable over the past few weeks currently 24 with ongoing thrombocytosis as well about at baseline over the past few weeks.  Patient reported to ED physician and friend with myself black stool over the past few weeks as well as one episode of black vomit.  Hemoccult negative here in the ED and stool appears to be brown on exam.  ED did contact patient's primary oncologist Dr. Benay Spice as well as GI on call for further evaluation and treatment.  2 unit PRBC ordered in the ED.  Otherwise patient received IV Protonix x1 and 500 cc sodium chloride bolus for borderline hypotension.  Hospitalist was called to admit  Review of Systems: As per HPI, denies nausea, diarrhea, constipation, headache, fever, chills, chest pain, shortness of breath.   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)  Anemia associated with chemotherapy   Leukocytosis   Syncope and collapse   Acute symptomatic anemia on chronic anemia of chronic disease with concurrent iron deficiency anemia, rule out GI bleed -2 unit PRBC in the ED -Patient given IV Protonix 40x1, will continue inpatient twice daily -Patient stopped taking iron at least 2 to 3 weeks prior due to nausea, unlikely related to patient's dark stool -Continue to follow likely, FOBT negative in the ED, unclear if previous bleeding event has now resolved -Certainly reasonable to consider outpatient endoscopy if hemoglobin stable and no further bleeding noted over the next 24 hours  Syncopal event, likely secondary to above -In the setting of acute symptomatic anemia as above -PT OT to follow, ensure patient is safe to ambulate, orthostatic vital signs pending as well  Stage IV metastatic gastric cancer, follows with Dr. Benay Spice on FOLFOX -Defer to oncology with Dr. Benay Spice -Per most recent oncology note he has completed cycle 2 FOLFOX plus nivolumab on 09/24/2019 -plan to reevaluate in the next week or two to evaluate further treatment -Currently receiving 2 unit PRBC as above, leukocytosis and thrombocytosis in the setting of chemo likely requiring filgrastim/similar  Insulin-dependent diabetes, well controlled Lab Results  Component Value Date   HGBA1C 7.7 (H) 09/17/2019    DVT prophylaxis: None given concern for active bleeding as above Code Status: Full Family Communication: Daughter at bedside, Spanish-speaking only, translator was used Status is: Inpatient  Dispo: The patient is from: Home              Anticipated d/c is to:  Likely home              Anticipated d/c date is: 24 to 48 hours pending clinical status, resolution of anemia, syncope, symptoms and possible further evaluation with GI and oncology              Patient currently not medically stable for discharge due to ongoing need for blood transfusion, IV fluids, close  monitoring in the setting of possible ongoing GI bleed.  Consultants:   Oncology, GI  Procedures:   None planned   Past Medical History:  Diagnosis Date  . Cancer ([place])    pancreas  . Diabetes mellitus without complication [facility])     Past Surgical History:  Procedure Laterality Date  . IR IMAGING GUIDED PORT INSERTION  09/03/2019     reports that he has quit smoking. He has never used smokeless tobacco. He reports previous alcohol use. He reports previous drug use.  No Known Allergies  Family History  Problem Relation Age of Onset  . Asthma Mother     Prior to Admission medications   Medication Sig Start Date End Date Taking? Authorizing Provider  diclofenac Sodium (VOLTAREN) 1 % GEL Apply 2 g topically 4 (four) times daily as needed (pain).   Yes [provider]  insulin NPH Human (NOVOLIN N) 100 UNIT/ML injection Inject 5-10 Units into the skin daily as needed (5 units if BS over 150/10 units if BS over 200).   Yes [provider]  lidocaine-prilocaine (EMLA) cream Apply 1 application topically as directed. Apply to port site 1 hour prior to stick and cover with plastic wrap. Start with 2nd chemo treatment 09/02/19  Yes Ladell Pier, MD  magic mouthwash SOLN Take 5 mLs by mouth 4 (four) times daily as needed for mouth pain (swish and spit). 09/10/19  Yes Ladell Pier, MD  oxyCODONE (ROXICODONE) 5 MG immediate release tablet Take 1 tablet (5 mg total) by mouth every 6 (six) hours as needed for severe pain. 10/08/19  Yes Owens Shark, NP  predniSONE (DELTASONE) 10 MG tablet Take 1 tablet (10 mg total) by mouth daily with breakfast. 10/08/19  Yes Owens Shark, NP  prochlorperazine (COMPAZINE) 10 MG tablet Take 1 tablet (10 mg total) by mouth every 6 (six) hours as needed for nausea. 09/02/19  Yes Ladell Pier, MD  ferrous sulfate 325 (65 FE) MG tablet Take 1 tablet (325 mg total) by mouth daily. Patient not taking: Reported on 10/16/2019 09/19/19 11/18/19   Shelly Coss, MD  polyethylene glycol (MIRALAX / GLYCOLAX) 17 g packet Take 17 g by mouth daily. Patient not taking: Reported on 09/25/2019 09/20/19   Shelly Coss, MD    Physical Exam: Vitals:   10/16/19 1430 10/16/19 1500 10/16/19 1513 10/16/19 1528  BP: 108/74 112/69 112/69 106/69  Pulse: 88 89 89 88  Resp: 14 16 13 15   Temp:   97.9 F (36.6 C) 97.8 F (36.6 C)  TempSrc:   Oral Oral  SpO2: 100% 100% 100% 100%    Constitutional: NAD, calm, comfortable Vitals:   10/16/19 1430 10/16/19 1500 10/16/19 1513 10/16/19 1528  BP: 108/74 112/69 112/69 106/69  Pulse: 88 89 89 88  Resp: 14 16 13 15   Temp:   97.9 F (36.6 C) 97.8 F (36.6 C)  TempSrc:   Oral Oral  SpO2: 100% 100% 100% 100%   General: Markedly cachectic gentleman, resting comfortably in bed, no acute distress. HEENT:  Normocephalic atraumatic.  Sclerae nonicteric, noninjected.  Extraocular movements intact bilaterally. Neck:  Without mass or deformity.  Trachea is midline. Lungs:  Clear to auscultate bilaterally without rhonchi, wheeze, or rales. Heart:  Regular rate and rhythm.  Without murmurs, rubs, or gallops. Abdomen:  Soft, nontender, nondistended.  Without guarding or rebound. Extremities: Without cyanosis, clubbing, edema, or obvious deformity. Vascular:  Dorsalis pedis and posterior tibial pulses palpable bilaterally. Skin:  Warm and dry, no erythema, no ulcerations.  Labs on Admission: I have personally reviewed following labs and imaging studies  CBC: Recent Labs  Lab 10/16/19 1216  WBC 24.4*  NEUTROABS 22.4*  HGB 5.9*  HCT 18.7*  MCV 88.6  PLT [id number]*   Basic Metabolic Panel: Recent Labs  Lab 10/16/19 1216  NA 132*  K 4.1  CL 98  CO2 23  GLUCOSE 142*  BUN 19  CREATININE 0.47*  CALCIUM 8.1*   GFR: Estimated Creatinine Clearance: 57.7 mL/min (A) (by C-G formula based on SCr of 0.47 mg/dL (L)). Liver Function Tests: No results for input(s): AST, ALT, ALKPHOS, BILITOT, PROT, ALBUMIN  in the last 168 hours. No results for input(s): LIPASE, AMYLASE in the last 168 hours. No results for input(s): AMMONIA in the last 168 hours. Coagulation Profile: No results for input(s): INR, PROTIME in the last 168 hours. Cardiac Enzymes: No results for input(s): CKTOTAL, CKMB, CKMBINDEX, TROPONINI in the last 168 hours. BNP (last 3 results) No results for input(s): PROBNP in the last 8760 hours. HbA1C: No results for input(s): HGBA1C in the last 72 hours. CBG: No results for input(s): GLUCAP in the last 168 hours. Lipid Profile: No results for input(s): CHOL, HDL, LDLCALC, TRIG, CHOLHDL, LDLDIRECT in the last 72 hours. Thyroid Function Tests: No results for input(s): TSH, T4TOTAL, FREET4, T3FREE, THYROIDAB in the last 72 hours. Anemia Panel: No results for input(s): VITAMINB12, FOLATE, FERRITIN, TIBC, IRON, RETICCTPCT in the last 72 hours. Urine analysis:    Component Value Date/Time   COLORURINE YELLOW 10/03/2019 [place] 10/03/2019 1719   LABSPEC 1.027 10/03/2019 1719   PHURINE 6.0 10/03/2019 1719   GLUCOSEU NEGATIVE 10/03/2019 1719   HGBUR MODERATE (A) 10/03/2019 1719   BILIRUBINUR NEGATIVE 10/03/2019 1719   KETONESUR 80 (A) 10/03/2019 1719   PROTEINUR NEGATIVE 10/03/2019 1719   NITRITE NEGATIVE 10/03/2019 1719   LEUKOCYTESUR NEGATIVE 10/03/2019 1719    Radiological Exams on Admission: DG Chest Portable 1 View  Result Date: 10/16/2019 CLINICAL DATA:  Syncope EXAM: PORTABLE CHEST 1 VIEW COMPARISON:  Sep 16, 2019 FINDINGS: Port-A-Cath tip is in the superior vena cava at the cavoatrial junction. No pneumothorax. The there is a granuloma in the left base. The lungs elsewhere are clear. Heart size and pulmonary vascularity are normal. There is a calcified lymph node at the aortopulmonary window. No adenopathy evident. No bone lesions. IMPRESSION: Calcified granulomas. No edema or airspace opacity. Cardiac silhouette normal. Port-A-Cath tip at cavoatrial  junction. No pneumothorax. Electronically Signed   By: Lowella Grip III M.D.   On: 10/16/2019 13:25    EKG: Independently reviewed. NSR without ST elevation/depression   [place] [facility]  If 7PM-7AM, please contact night-coverage [email or web address]   10/16/2019, 4:50 PM

## 2019-10-17 LAB — COMPREHENSIVE METABOLIC PANEL
ALT: 13 U/L (ref 0–44)
AST: 62 U/L — ABNORMAL HIGH (ref 15–41)
Albumin: 2.5 g/dL — ABNORMAL LOW (ref 3.5–5.0)
Alkaline Phosphatase: 184 U/L — ABNORMAL HIGH (ref 38–126)
Anion gap: 10 (ref 5–15)
BUN: 14 mg/dL (ref 8–23)
CO2: 24 mmol/L (ref 22–32)
Calcium: 8.5 mg/dL — ABNORMAL LOW (ref 8.9–10.3)
Chloride: 98 mmol/L (ref 98–111)
Creatinine, Ser: 0.54 mg/dL — ABNORMAL LOW (ref 0.61–1.24)
GFR calc Af Amer: >60 mL/min (ref >60)
GFR calc non Af Amer: >60 mL/min (ref >60)
Glucose, Bld: 94 mg/dL (ref 70–99)
Potassium: 4.1 mmol/L (ref 3.5–5.1)
Sodium: 132 mmol/L — ABNORMAL LOW (ref 135–145)
Total Bilirubin: 0.7 mg/dL (ref 0.3–1.2)
Total Protein: 6 g/dL — ABNORMAL LOW (ref 6.5–8.1)

## 2019-10-17 LAB — CBC
HCT: 26.8 % — ABNORMAL LOW (ref 39.0–52.0)
Hemoglobin: 8.9 g/dL — ABNORMAL LOW (ref 13.0–17.0)
MCH: 29.2 pg (ref 26.0–34.0)
MCHC: 33.2 g/dL (ref 30.0–36.0)
MCV: 87.9 fL (ref 80.0–100.0)
Platelets: 472 10*3/uL — ABNORMAL HIGH (ref 150–400)
RBC: 3.05 MIL/uL — ABNORMAL LOW (ref 4.22–5.81)
RDW: 18.1 % — ABNORMAL HIGH (ref 11.5–15.5)
WBC: 22.8 10*3/uL — ABNORMAL HIGH (ref 4.0–10.5)
nRBC: 0 % (ref 0.0–0.2)

## 2019-10-17 LAB — GLUCOSE, CAPILLARY
Glucose-Capillary: 95 mg/dL (ref 70–99)
Glucose-Capillary: 127 mg/dL — ABNORMAL HIGH (ref 70–99)
Glucose-Capillary: 217 mg/dL — ABNORMAL HIGH (ref 70–99)
Glucose-Capillary: 152 mg/dL — ABNORMAL HIGH (ref 70–99)

## 2019-10-17 LAB — BPAM RBC
Blood Product Expiration Date: 202107062359
Blood Product Expiration Date: 202107062359
ISSUE DATE / TIME: 202106031505
ISSUE DATE / TIME: 202106032042
Unit Type and Rh: 5100
Unit Type and Rh: 5100

## 2019-10-17 LAB — TYPE AND SCREEN
ABO/RH(D): O POS
Antibody Screen: NEGATIVE
Unit division: 0
Unit division: 0

## 2019-10-17 MED ORDER — ADULT MULTIVITAMIN W/MINERALS CH
1.0000 | ORAL_TABLET | Freq: Every day | ORAL | Status: DC
  Administered 2019-10-17 – 2019-10-19 (×2): 1 via ORAL
  Filled 2019-10-17 (×2): qty 1

## 2019-10-17 MED ORDER — ENSURE ENLIVE PO LIQD
237.0000 mL | Freq: Three times a day (TID) | ORAL | Status: DC
  Administered 2019-10-17 – 2019-10-19 (×4): 237 mL via ORAL

## 2019-10-17 NOTE — Progress Notes (Signed)
PROGRESS NOTE    Patient: Alexander Lawson                            PCP: Patient, No Pcp Per                    DOB: 1946-04-03            DOA: 10/16/2019 YTK:[id number]             DOS: 10/17/2019, 1:19 PM   LOS: 1 day   Date of Service: The patient was seen and examined on 10/17/2019  Subjective:   The patient was seen and examined this Am. Denies of having any abdominal pain, nausea vomiting.. Tolerating breakfast  no issues overnight .  Brief Narrative:  Per HPI:   Alexander Lawson is a 74 y.o. male with medical history significant of Stage 4 metastatic gastric cancer - following with Dr. Benay Spice currently on Folfox. He presented for his covid vaccine today with his daughter and subsequently had a syncopal event after receiving his vaccine.  Reports he has been feeling well prior to this, somewhat fatigued but not all that much different than his normal baseline around chemotherapy.   They did indicate one single episode of vomiting 3 to 4 days ago which was black in color.  Further discussion patient indicates black stool over the past few days to week as well, but denies any bright red blood per rectum or hematemesis  ED Course:  In ED patient was noted to have marked anemia at 5.9, leukocytosis appears to be stable over the past few weeks currently 24 with ongoing thrombocytosis as well about at baseline over the past few weeks.  Patient reported black stool over the past few weeks as well as one episode of black vomit.   Hemoccult negative.  ED did contact patient's primary oncologist Dr. Benay Spice as well as GI on call for further evaluation and treatment.   2 unit PRBC ordered in the ED.  Otherwise patient received IV Protonix x1 and 500 cc sodium chloride bolus for borderline hypotension.    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)   Anemia associated with chemotherapy   Leukocytosis   Syncope and collapse   Acute symptomatic anemia on chronic anemia of chronic disease with concurrent iron deficiency anemia, rule out GI bleed -S/p transfusion with 2 unit PRBC H&H improved from 5.9-8.9 today -Patient given IV Protonix 40x1, continue as twice daily -Patient stopped taking iron at least 2 to 3 weeks prior due to nausea, unlikely related to patient's dark stool -Continue to follow likely, FOBT negative in the ED, unclear if previous bleeding event has now resolved -Appreciate GI evaluation further work-up -Monitoring the patient and H&H closely over next 24 hours    Syncopal event, likely secondary to above -In the setting of acute symptomatic anemia as above -PT OT to follow, ensure patient is safe to ambulate, orthostatic vital signs pending as well  Stage IV metastatic gastric cancer, follows with Dr. Benay Spice on FOLFOX Pending oncology evaluation recommendation -Defer to oncology with Dr. Benay Spice -Per most recent oncology note  he has completed cycle 2 FOLFOX plus nivolumab on 09/24/2019 -plan to reevaluate in the next week or two to evaluate further treatment -Status post transfusion with 2 unit PRBC as above, leukocytosis and thrombocytosis in the setting of chemo likely requiring filgrastim/similar -Hemoglobin improved from 5.9-to- 8.9 today  Insulin-dependent diabetes,  well controlled -CBG QA CHS, with SSI    Nutritional status:          Cultures; None    Antimicrobials: non    Consultants:  [facility]   ------------------------------------------------------------------------------------------------------------------------------------------------  DVT prophylaxis:  SCD/Compression stockings Code Status:   Code Status: Full Code Family Communication:  No family member present at bedside- attempt will be made to update daily The above findings and plan of care has been discussed with patient (and family )  in detail,  they expressed understanding and agreement of above. -Advance care planning has been discussed.   Admission status:   Status is: Inpatient  Remains inpatient appropriate because:Inpatient level of care appropriate due to severity of illness   Dispo: The patient is from: Home              Anticipated d/c is to: Home              Anticipated d/c date is: 1 day if no further GI work-up or oncology recommendation              Patient currently is not medically stable to d/c.          Procedures:   No admission procedures for hospital encounter.     Antimicrobials:  Anti-infectives (From admission, onward)   None       Medication:  . feeding supplement (ENSURE ENLIVE)  237 mL Oral BID BM  . insulin aspart  0-5 Units Subcutaneous QHS  . insulin aspart  0-6 Units Subcutaneous TID WC  . pantoprazole (PROTONIX) IV  40 mg Intravenous Q12H  . predniSONE  10 mg Oral Q breakfast    acetaminophen **OR** acetaminophen, magic mouthwash, ondansetron **OR** ondansetron (ZOFRAN) IV, oxyCODONE, prochlorperazine, senna-docusate   Objective:   Vitals:   10/17/19 0004 10/17/19 0612 10/17/19 0900 10/17/19 1123  BP: 109/67 (!) 105/59  109/66  Pulse: 76 77  78  Resp: 18 16  (!) 22  Temp: 97.9 F (36.6 C) 97.9 F (36.6 C)  98.1 F (36.7 C)  TempSrc:  Oral    SpO2: 100% 98% 100% 100%  Weight:      Height:        Intake/Output Summary (Last 24 hours) at 10/17/2019 1319 Last data filed at 10/17/2019 1035 Gross per 24 hour  Intake 1784.17 ml  Output 0 ml  Net 1784.17 ml   Filed Weights   10/16/19 1829  Weight: 46.9 kg     Examination:   Physical Exam  Constitution:  Alert, cooperative, no distress,  Appears calm and  comfortable  Psychiatric: Normal and stable mood and affect, cognition intact,   HEENT: Normocephalic, PERRL, otherwise with in Normal limits  Chest:Chest symmetric Cardio vascular:  S1/S2, RRR, No murmure, No Rubs or Gallops  pulmonary: Clear to auscultation bilaterally, respirations unlabored, negative wheezes / crackles Abdomen: Soft, non-tender, non-distended, bowel sounds,no masses, no organomegaly Muscular skeletal: Limited exam - in bed, able to move all 4 extremities, Normal strength,  Neuro: CNII-XII intact. , normal motor and sensation, reflexes intact  Extremities: No pitting edema lower extremities, +2 pulses  Skin: Dry, warm to touch, negative for any Rashes, No open wounds  Wounds: per nursing documentation    ------------------------------------------------------------------------------------------------------------------------------------------    LABs:  CBC Latest Ref Rng & Units 10/17/2019 10/16/2019 10/08/2019  WBC 4.0 - 10.5 K/uL 22.8(H) 24.4(H) 36.2(H)  Hemoglobin 13.0 - 17.0 g/dL 8.9(L) 5.9(LL) 8.0(L)  Hematocrit 39.0 - 52.0 % 26.8(L) 18.7(L) 24.0(L)  Platelets 150 - 400 K/uL 472(H) 434(H) 423(H)   CMP Latest Ref Rng & Units 10/17/2019 10/16/2019 10/08/2019  Glucose 70 - 99 mg/dL 94 142(H) 117(H)  BUN 8 - 23 mg/dL 14 19 13   Creatinine 0.61 - 1.24 mg/dL 0.54(L) 0.47(L) 0.62  Sodium 135 - 145 mmol/L 132(L) 132(L) 132(L)  Potassium 3.5 - 5.1 mmol/L 4.1 4.1 4.3  Chloride 98 - 111 mmol/L 98 98 96(L)  CO2 22 - 32 mmol/L 24 23 27   Calcium 8.9 - 10.3 mg/dL 8.5(L) 8.1(L) 8.9  Total Protein 6.5 - 8.1 g/dL 6.0(L) - 6.4(L)  Total Bilirubin 0.3 - 1.2 mg/dL 0.7 - 0.3  Alkaline Phos 38 - 126 U/L 184(H) - 190(H)  AST 15 - 41 U/L 62(H) - 44(H)  ALT 0 - 44 U/L 13 - 10       Micro Results Recent Results (from the past 240 hour(s))  SARS Coronavirus 2 by RT PCR (hospital order, performed in [facility] hospital lab) Nasopharyngeal Nasopharyngeal Swab     Status: None   Collection  Time: 10/16/19  4:10 PM   Specimen: Nasopharyngeal Swab  Result Value Ref Range Status   SARS Coronavirus 2 NEGATIVE NEGATIVE Final    Comment: (NOTE) SARS-CoV-2 target nucleic acids are NOT DETECTED. The SARS-CoV-2 RNA is generally detectable in upper and lower respiratory specimens during the acute phase of infection. The lowest concentration of SARS-CoV-2 viral copies this assay can detect is 250 copies / mL. A negative result does not preclude SARS-CoV-2 infection and should not be used as the sole basis for treatment or other patient management decisions.  A negative result may occur with improper specimen collection / handling, submission of specimen other than nasopharyngeal swab, presence of viral mutation(s) within the areas targeted by this assay, and inadequate number of viral copies (<250 copies / mL). A negative result must be combined with clinical observations, patient history, and epidemiological information. Fact Sheet for Patients:   StrictlyIdeas.no Fact Sheet for Healthcare Providers: BankingDealers.co.za This test is not yet approved or cleared  by the [place] FDA and has been authorized for detection and/or diagnosis of SARS-CoV-2 by FDA under an Emergency Use Authorization (EUA).  This EUA will remain in effect (meaning this test can be used) for the duration of the COVID-19 declaration under Section 564(b)(1) of the Act, 21 U.S.C. section 360bbb-3(b)(1), unless the authorization is terminated or revoked sooner. Performed at [facility], [place] [address] [place], [place] [address]     Radiology Reports CT Angio Chest PE W/Cm &/Or Wo Cm  Result Date: 10/03/2019 CLINICAL DATA:  Shortness of breath. Failure to thrive. Active chemotherapy. EXAM: CT ANGIOGRAPHY CHEST WITH CONTRAST TECHNIQUE: Multidetector CT imaging of the chest was performed using the standard protocol during bolus  administration of intravenous contrast. Multiplanar CT image reconstructions and MIPs were obtained to evaluate the vascular anatomy. CONTRAST:  184mL OMNIPAQUE IOHEXOL 350 MG/ML SOLN COMPARISON:  Chest CT 09/16/2019 FINDINGS: Cardiovascular: There are no filling defects within the pulmonary arteries to suggest pulmonary embolus. The thoracic aorta is normal in caliber. Cannot assess for dissection given phase of contrast tailored to pulmonary artery evaluation. Minor aortic atherosclerosis right chest port in place,  tip obscured by dense contrast. Heart is normal in size. No pericardial effusion. Mediastinum/Nodes: Calcified left hilar lymph nodes again seen. No evidence of noncalcified adenopathy. Thyroid gland and esophagus are unremarkable. Lungs/Pleura: Breathing motion artifact through the lung bases. There scattered subcentimeter pulmonary nodules that are unchanged from prior exam. Nodules in the left apex series 7, images 17, 18, and 21. Nodule in the right upper lobe series 7, image 32. Subpleural right middle lobe pulmonary nodules, series 7 image 94 and 116, and paramediastinal nodule in the right middle lobe image 97. calcified granuloma in the lingula. Suggestion of subpleural nodules at the right lung base that are obscured by breathing motion. Improved bibasilar atelectasis from prior. No pleural fluid. Upper Abdomen: No hepatic metastatic disease. Known left upper quadrant mass lesion, partially included. No significant upper abdominal free fluid. Musculoskeletal: No lytic or blastic osseous lesion. No acute osseous abnormality. Generalized paucity of body fat which may represent cachexia. Review of the MIP images confirms the above findings. IMPRESSION: 1. No pulmonary embolus. 2. Multiple subcentimeter pulmonary nodules, unchanged from CT 2 and half weeks ago. At least 1 of these nodules is a calcified granuloma. Remaining nodules are indeterminate in regards to metastatic disease. 3. Improved  bibasilar aeration with residual atelectasis. 4. Known left upper quadrant mass lesion, partially included. Aortic Atherosclerosis (ICD10-I70.0). Electronically Signed   By: Keith Rake M.D.   On: 10/03/2019 21:04   DG Chest Portable 1 View  Result Date: 10/16/2019 CLINICAL DATA:  Syncope EXAM: PORTABLE CHEST 1 VIEW COMPARISON:  Sep 16, 2019 FINDINGS: Port-A-Cath tip is in the superior vena cava at the cavoatrial junction. No pneumothorax. The there is a granuloma in the left base. The lungs elsewhere are clear. Heart size and pulmonary vascularity are normal. There is a calcified lymph node at the aortopulmonary window. No adenopathy evident. No bone lesions. IMPRESSION: Calcified granulomas. No edema or airspace opacity. Cardiac silhouette normal. Port-A-Cath tip at cavoatrial junction. No pneumothorax. Electronically Signed   By: Lowella Grip III M.D.   On: 10/16/2019 13:25    SIGNED: Deatra James, MD, FACP, FHM. [facility],  Pager (please use [email or web address] to page/text)  If 7PM-7AM, please contact night-coverage [email or web address], Password [facility] 10/17/2019, 1:19 PM

## 2019-10-17 NOTE — Progress Notes (Signed)
Initial Nutrition Assessment  DOCUMENTATION CODES:   Severe malnutrition in context of chronic illness, Underweight  INTERVENTION:  - will increase Ensure Enlive from BID to TID, each supplement provides 350 kcal and 20 grams protein (total of 1050 kcal, 60 grams protein/day) - will order 1 tablet multivitamin with minerals/day. - will downgrade diet to Dysphagia 2, thin liquids.   *if feasible (dependent on extent of mets), highly recommend J-tube placement for nocturnal feeds and allow patient to eat during the day.    NUTRITION DIAGNOSIS:   Severe Malnutrition related to chronic illness, cancer and cancer related treatments as evidenced by severe fat depletion, severe muscle depletion.  -  GOAL:   Patient will meet greater than or equal to 90% of their needs  MONITOR:   PO intake, Supplement acceptance, Labs, Weight trends  REASON FOR ASSESSMENT:   Malnutrition Screening Tool  ASSESSMENT:   74 y.o. male with medical history significant of stage 4 gastric cancer - following with Dr. Benay Spice currently on Folfox. He had a syncopal episode after receiving COVID vaccine on 6/3.  Per flow sheet documentation, patient consumed 70% of breakfast this AM (420 kcal, 19 grams protein). Visualized lunch tray with only a few bites of fish taken. Patient drank 100% of a bottle of Ensure.   Able to talk with patient and daughter, who is at bedside, with the assistance of interpretor Elita Quick, [phone]).   Patient has been experiencing taste changes (loss of taste) while on chemo, but this is slowly starting to improve. He has been trying to focus on drinking fluids, mainly water, milk, and Ensure (or similar). He has no difficulties with liquids but feels like solid foods get stuck in his throat and in part d/t inability to adequately chew and in part because of medical condition. He mainly consumes puree-texture items at home such as soups. He is interested in receiving softer items that he  can better tolerate during hospitalization.   Once he consumes items, he does not have abdominal pain/pressure or nausea. But, constipation has been a concern and he has irregular BMs, often going several days without a BM. This causing him to eat less and can sometimes lead to vomiting simply d/t being backed up.   Patient is interested in what he can take to help with constipation. Encouraged ongoing focus on fluid intake and that beyond that, defer to Oncologist d/t gastric cancer dx.   Per chart review ,weight on 6/3 was 103 lb and weight on 4/30 was 114 lb. This indicates 11 lb weight loss (9.6% body weight) in 1 month; significant for time frame.   Per notes, plan is for esophagram tomorrow.     Labs reviewed; CBGs: 95 and 127 mg/dl, Na: 132 mmol/l, creatinine: 0.54 mg/dl, Ca: 8.5 mg/dl. Medications reviewed; sliding scale novolog, 40 mg IV protonix BID, 10 mg deltasone/day.     NUTRITION - FOCUSED PHYSICAL EXAM:    Most Recent Value  Orbital Region  Severe depletion  Upper Arm Region  Severe depletion  Thoracic and Lumbar Region  Severe depletion  Buccal Region  Severe depletion  Temple Region  Moderate depletion  Clavicle Bone Region  Severe depletion  Clavicle and Acromion Bone Region  Severe depletion  Scapular Bone Region  Unable to assess  Dorsal Hand  Severe depletion  Patellar Region  Unable to assess  Anterior Thigh Region  Unable to assess  Posterior Calf Region  Unable to assess  Edema (RD Assessment)  Unable to assess  Hair  Reviewed  Eyes  Reviewed  Mouth  Reviewed  Skin  Reviewed  Nails  Reviewed       Diet Order:   Diet Order            DIET DYS 2 Room service appropriate? Yes; Fluid consistency: Thin  Diet effective now              EDUCATION NEEDS:   No education needs have been identified at this time  Skin:  Skin Assessment: Reviewed RN Assessment  Last BM:  PTA/unknown  Height:   Ht Readings from Last 1 Encounters:  10/16/19 5'  8.5" (1.74 m)    Weight:   Wt Readings from Last 1 Encounters:  10/16/19 46.9 kg    Ideal Body Weight:     BMI:  Body mass index is 15.5 kg/m.  Estimated Nutritional Needs:   Kcal:  1700-1900 kcal  Protein:  85-100 grams  Fluid:  >/= 2.2 L/day     Jarome Matin, MS, RD, LDN, CNSC Inpatient Clinical Dietitian RD pager # available in [facility]  After hours/weekend pager # available in [facility]

## 2019-10-18 ENCOUNTER — Encounter (HOSPITAL_COMMUNITY): Payer: Self-pay | Admitting: Internal Medicine

## 2019-10-18 ENCOUNTER — Inpatient Hospital Stay (HOSPITAL_COMMUNITY): Payer: Self-pay | Admitting: Anesthesiology

## 2019-10-18 ENCOUNTER — Encounter (HOSPITAL_COMMUNITY)
Admission: EM | Disposition: A | Discharge status: Home / Self Care | Payer: Self-pay | Source: Home / Self Care | Attending: Internal Medicine

## 2019-10-18 DIAGNOSIS — D62 Acute posthemorrhagic anemia: Secondary | ICD-10-CM

## 2019-10-18 DIAGNOSIS — R1310 Dysphagia, unspecified: Secondary | ICD-10-CM

## 2019-10-18 DIAGNOSIS — R55 Syncope and collapse: Secondary | ICD-10-CM

## 2019-10-18 DIAGNOSIS — E43 Unspecified severe protein-calorie malnutrition: Secondary | ICD-10-CM | POA: Insufficient documentation

## 2019-10-18 HISTORY — PX: ESOPHAGOGASTRODUODENOSCOPY (EGD) WITH PROPOFOL: SHX5813

## 2019-10-18 LAB — CBC
HCT: 25.7 % — ABNORMAL LOW (ref 39.0–52.0)
Hemoglobin: 8.5 g/dL — ABNORMAL LOW (ref 13.0–17.0)
MCH: 29.3 pg (ref 26.0–34.0)
MCHC: 33.1 g/dL (ref 30.0–36.0)
MCV: 88.6 fL (ref 80.0–100.0)
Platelets: 454 10*3/uL — ABNORMAL HIGH (ref 150–400)
RBC: 2.9 MIL/uL — ABNORMAL LOW (ref 4.22–5.81)
RDW: 18.1 % — ABNORMAL HIGH (ref 11.5–15.5)
WBC: 27.6 10*3/uL — ABNORMAL HIGH (ref 4.0–10.5)
nRBC: 0 % (ref 0.0–0.2)

## 2019-10-18 LAB — GLUCOSE, CAPILLARY
Glucose-Capillary: 133 mg/dL — ABNORMAL HIGH (ref 70–99)
Glucose-Capillary: 163 mg/dL — ABNORMAL HIGH (ref 70–99)
Glucose-Capillary: 141 mg/dL — ABNORMAL HIGH (ref 70–99)
Glucose-Capillary: 192 mg/dL — ABNORMAL HIGH (ref 70–99)
Glucose-Capillary: 158 mg/dL — ABNORMAL HIGH (ref 70–99)

## 2019-10-18 SURGERY — ESOPHAGOGASTRODUODENOSCOPY (EGD) WITH PROPOFOL
Anesthesia: Monitor Anesthesia Care

## 2019-10-18 MED ORDER — CHLORHEXIDINE GLUCONATE CLOTH 2 % EX PADS
6.0000 | MEDICATED_PAD | Freq: Every day | CUTANEOUS | Status: DC
  Administered 2019-10-18 – 2019-10-19 (×2): 6 via TOPICAL

## 2019-10-18 MED ORDER — ONDANSETRON HCL 4 MG/2ML IJ SOLN
INTRAMUSCULAR | Status: DC | PRN
  Administered 2019-10-18: 4 mg via INTRAVENOUS

## 2019-10-18 MED ORDER — PROPOFOL 10 MG/ML IV BOLUS
INTRAVENOUS | Status: DC | PRN
  Administered 2019-10-18 (×3): 20 mg via INTRAVENOUS

## 2019-10-18 MED ORDER — LACTATED RINGERS IV SOLN
INTRAVENOUS | Status: AC | PRN
  Administered 2019-10-18: 1000 mL via INTRAVENOUS

## 2019-10-18 MED ORDER — PROPOFOL 500 MG/50ML IV EMUL
INTRAVENOUS | Status: DC | PRN
  Administered 2019-10-18: 75 ug/kg/min via INTRAVENOUS

## 2019-10-18 MED ORDER — PROPOFOL 500 MG/50ML IV EMUL
INTRAVENOUS | Status: AC
  Filled 2019-10-18: qty 50

## 2019-10-18 MED ORDER — SUCRALFATE 1 G PO TABS
1.0000 g | ORAL_TABLET | Freq: Three times a day (TID) | ORAL | Status: DC
  Administered 2019-10-18 – 2019-10-19 (×4): 1 g via ORAL
  Filled 2019-10-18 (×5): qty 1

## 2019-10-18 SURGICAL SUPPLY — 15 items

## 2019-10-18 NOTE — Anesthesia Procedure Notes (Signed)
Procedure Name: MAC Date/Time: 10/18/2019 10:12 AM Performed by: Lollie Sails, CRNA Pre-anesthesia Checklist: Patient identified, Emergency Drugs available, Suction available and Patient being monitored Oxygen Delivery Method: Nasal cannula

## 2019-10-18 NOTE — Anesthesia Postprocedure Evaluation (Signed)
Anesthesia Post Note  Patient: Alexander Lawson  Procedure(s) Performed: ESOPHAGOGASTRODUODENOSCOPY (EGD) WITH PROPOFOL (N/A )     Patient location during evaluation: PACU Anesthesia Type: MAC Level of consciousness: awake and alert Pain management: pain level controlled Vital Signs Assessment: post-procedure vital signs reviewed and stable Respiratory status: spontaneous breathing, nonlabored ventilation, respiratory function stable and patient connected to nasal cannula oxygen Cardiovascular status: stable and blood pressure returned to baseline Postop Assessment: no apparent nausea or vomiting Anesthetic complications: no    Last Vitals:  Vitals:   10/18/19 1053 10/18/19 1055  BP:    Pulse: 73 75  Resp: (!) 21 (!) 23  Temp:    SpO2: 99% 100%    Last Pain:  Vitals:   10/18/19 1053  TempSrc:   PainSc: 0-No pain                 Tiajuana Amass

## 2019-10-18 NOTE — Transfer of Care (Signed)
Immediate Anesthesia Transfer of Care Note  Patient: Alexander Lawson  Procedure(s) Performed: ESOPHAGOGASTRODUODENOSCOPY (EGD) WITH PROPOFOL (N/A )  Patient Location: PACU and Endoscopy Unit  Anesthesia Type:MAC  Level of Consciousness: awake, sedated and responds to stimulation  Airway & Oxygen Therapy: Patient Spontanous Breathing and Patient connected to nasal cannula oxygen  Post-op Assessment: Report given to RN and Post -op Vital signs reviewed and stable  Post vital signs: Reviewed and stable  Last Vitals:  Vitals Value Taken Time  BP 82/52 10/18/19 1028  Temp    Pulse    Resp 21 10/18/19 1029  SpO2    Vitals shown include unvalidated device data.  Last Pain:  Vitals:   10/18/19 0958  TempSrc: Oral  PainSc: 0-No pain      Patients Stated Pain Goal: 0 (73/22/02 5427)  Complications: No apparent anesthesia complications

## 2019-10-18 NOTE — Interval H&P Note (Signed)
History and Physical Interval Note:  10/18/2019 9:39 AM  Upper GI series has not been done yet, after EGD will request it if still needed. May help to better define anatomy if there is significant narrowing at EG junction.  Alexander Lawson  has presented today for surgery, with the diagnosis of dysphagia, gastric cancer.  The various methods of treatment have been discussed with the patient and family. After consideration of risks, benefits and other options for treatment, the patient has consented to  Procedure(s): ESOPHAGOGASTRODUODENOSCOPY (EGD) WITH PROPOFOL (N/A) as a surgical intervention.  The patient's history has been reviewed, patient examined, no change in status, stable for surgery.  I have reviewed the patient's chart and labs.  Questions were answered to the patient's satisfaction.     [name]

## 2019-10-18 NOTE — Op Note (Addendum)
[facility] Patient Name: Alexander Lawson Procedure Date: 10/18/2019 MRN: [id number] Attending MD: Mauri Pole , MD Date of Birth: Jun 08, 1945 CSN: [id number] Age: 73 Admit Type: Inpatient Procedure:                Upper GI endoscopy Indications:              Suspected upper gastrointestinal bleeding,                            Dysphagia, Melena with h/o known gastric cancer Providers:                Mauri Pole, MD, Elmer Ramp. Tilden Dome, RN,                            Laverda Sorenson, Technician, Ogle Zenia Resides CRNA,                            CRNA Referring MD:              Medicines:                Monitored Anesthesia Care Complications:            No immediate complications. Estimated Blood Loss:     Estimated blood loss was minimal. Procedure:                Pre-Anesthesia Assessment:                           - Prior to the procedure, a History and Physical                            was performed, and patient medications and                            allergies were reviewed. The patient's tolerance of                            previous anesthesia was also reviewed. The risks                            and benefits of the procedure and the sedation                            options and risks were discussed with the patient.                            All questions were answered, and informed consent                            was obtained. Prior Anticoagulants: The patient has                            taken no previous anticoagulant or antiplatelet  agents. ASA Grade Assessment: III - A patient with                            severe systemic disease. After reviewing the risks                            and benefits, the patient was deemed in                            satisfactory condition to undergo the procedure.                           After obtaining informed consent, the endoscope was   passed under direct vision. Throughout the                            procedure, the patient's blood pressure, pulse, and                            oxygen saturations were monitored continuously. The                            [id number]-H190 ([id number] Olympus gastroscope was                            introduced through the mouth, and advanced to the                            second part of duodenum. The upper GI endoscopy was                            accomplished without difficulty. The patient                            tolerated the procedure well. Scope In: Scope Out: Findings:      No gross lesions were noted in the entire esophagus.      A large, infiltrative, non-circumferential mass, friable with oozing       bleeding and stigmata of recent bleeding was found in the cardia and in       the gastric body. The mass extends from EG junction to the body along       the lesser curvature, is non obstructive      The examined duodenum was normal. Impression:               - No gross lesions in esophagus.                           - Known malignant gastric tumor in the cardia and                            in the gastric body.                           - Normal examined duodenum.                           -  No specimens collected. Moderate Sedation:      Not Applicable - Patient had care per Anesthesia. Recommendation:           - Patient has a contact number available for                            emergencies. The signs and symptoms of potential                            delayed complications were discussed with the                            patient. Return to normal activities tomorrow.                            Written discharge instructions were provided to the                            patient.                           - Resume previous diet.                           - Continue present medications.                           - Monitor Hgb and transfuse as needed                            - Protonix twice daily                           - Use sucralfate tablets 1 gram PO QID.                           - Follow up with Oncology                           - GI will sign off, available if have any questions Procedure Code(s):        --- Professional ---                           [phone], Esophagogastroduodenoscopy, flexible,                            transoral; diagnostic, including collection of                            specimen(s) by brushing or washing, when performed                            (separate procedure) Diagnosis Code(s):        --- Professional ---                           C16.0, Malignant  neoplasm of cardia                           C16.2, Malignant neoplasm of body of stomach                           R13.10, Dysphagia, unspecified                           K92.1, Melena (includes Hematochezia) CPT copyright 2019 [employer]. All rights reserved. The codes documented in this report are preliminary and upon coder review may  be revised to meet current compliance requirements. Mauri Pole, MD 10/18/2019 10:31:41 AM This report has been signed electronically. Number of Addenda: 0

## 2019-10-18 NOTE — Anesthesia Preprocedure Evaluation (Signed)
Anesthesia Evaluation  Patient identified by MRN, date of birth, ID band Patient awake    Reviewed: Allergy & Precautions, NPO status , Patient's Chart, lab work & pertinent test results  Airway Mallampati: II  TM Distance: >3 FB     Dental  (+) Dental Advisory Given   Pulmonary former smoker,    breath sounds clear to auscultation       Cardiovascular negative cardio ROS   Rhythm:Regular Rate:Normal     Neuro/Psych negative neurological ROS     GI/Hepatic Neg liver ROS, Gastric CA   Endo/Other  diabetes, Insulin Dependent  Renal/GU negative Renal ROS     Musculoskeletal   Abdominal   Peds  Hematology  (+) anemia ,   Anesthesia Other Findings   Reproductive/Obstetrics                             Anesthesia Physical Anesthesia Plan  ASA: IV  Anesthesia Plan: MAC   Post-op Pain Management:    Induction:   PONV Risk Score and Plan: 1 and Propofol infusion, Ondansetron and Treatment may vary due to age or medical condition  Airway Management Planned: Natural Airway and Nasal Cannula  Additional Equipment: None  Intra-op Plan:   Post-operative Plan:   Informed Consent: I have reviewed the patients History and Physical, chart, labs and discussed the procedure including the risks, benefits and alternatives for the proposed anesthesia with the patient or authorized representative who has indicated his/her understanding and acceptance.     Dental advisory given  Plan Discussed with: CRNA  Anesthesia Plan Comments:         Anesthesia Quick Evaluation

## 2019-10-18 NOTE — Plan of Care (Signed)
Patient continues with poor appetite, did eat soup, some creamed potatoes and drank Ensure on 7 a to 7 p shift.  Informed daughter of results of EGD via interpreter.  Patient denies nausea or abdominal pain.

## 2019-10-18 NOTE — Progress Notes (Signed)
PROGRESS NOTE    Patient: Alexander Lawson                            PCP: Patient, No Pcp Per                    DOB: Jul 08, 1945            DOA: 10/16/2019 [id number]             DOS: 10/18/2019, 12:16 PM   LOS: 2 days   Date of Service: The patient was seen and examined on 10/18/2019  Subjective:   The patient was seen and examined this morning with interpretor via video available. He is seen before his EGD. He reports difficulty eating, but denies any pain. He reports that he is relaxed and is ready to have his EGD. He has no questions or concerns at this time.   Brief Narrative:  Per HPI:   Amro Winebarger is a 74 y.o. male with medical history significant of Stage 4 metastatic gastric cancer - following with Dr. Benay Spice currently on Folfox. He presented for his covid vaccine today with his daughter and subsequently had a syncopal event after receiving his vaccine.  Reports he has been feeling well prior to this, somewhat fatigued but not all that much different than his normal baseline around chemotherapy.   They did indicate one single episode of vomiting 3 to 4 days ago which was black in color.  Further discussion patient indicates black stool over the past few days to week as well, but denies any bright red blood per rectum or hematemesis  ED Course:  In ED patient was noted to have marked anemia at 5.9, leukocytosis appears to be stable over the past few weeks currently 24 with ongoing thrombocytosis as well about at baseline over the past few weeks.  Patient reported black stool over the past few weeks as well as one episode of black vomit.   Hemoccult negative.  ED did contact patient's primary oncologist Dr. Benay Spice as well as GI on call for further evaluation and treatment.   2 unit PRBC ordered in the ED.  Otherwise patient received IV Protonix x1 and 500 cc sodium chloride bolus for borderline hypotension.    10/17/2019 -patient was seen and examined, remained stable  status post 2 units of packed red blood cell transfusion, H&H is improved from 5.9-8.9 -no further nausea vomiting diarrhea Pending GI consultation, pending oncology report  10/18/2019 - Underwent EGD with large, infiltrative, non-circumferential mass, friable with oozing bleeding and stigmata of recent bleeding was found in the cardia and in the gastric body. 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)   Anemia associated with chemotherapy   Leukocytosis   Syncope and collapse   Protein-calorie malnutrition, severe   Dysphagia   Acute blood loss anemia   Acute symptomatic anemia on chronic anemia of chronic disease with concurrent iron deficiency anemia with acute blood loss anemia -S/p transfusion with 2 unit PRBC H&H improved from 5.9-8.9 today -Patient given IV Protonix 40x1, continue as twice daily -Patient stopped taking iron at least 2 to 3 weeks prior due to nausea, unlikely related to patient's dark stool -Continue sucralofate 1 g PO as  well -Continue to follow likely, FOBT negative in the ED, unclear if previous bleeding event has now resolved -GI eval with EGD:  large, infiltrative, non-circumferential mass, friable with oozing        bleeding and stigmata of recent bleeding was found in the cardia and in        the gastric body. The mass extends from EG junction to the body along        the lesser curvature, is non obstructive -Will repeat hgb this afternoon and again in AM -GI following and appreciate their assistance    Syncopal event, likely secondary to above -In the setting of acute symptomatic anemia as above -PT OT to follow, ensure patient is safe to ambulate  Stage IV metastatic gastric cancer, follows with Dr. Benay Spice on FOLFOX Pending oncology evaluation  recommendation -Defer to oncology with Dr. Benay Spice -Per most recent oncology note he has completed cycle 2 FOLFOX plus nivolumab on 09/24/2019 -plan to reevaluate in the next week or two to evaluate further treatment -Status post transfusion with 2 unit PRBC as above, leukocytosis and thrombocytosis in the setting of chemo likely requiring filgrastim/similar  Insulin-dependent diabetes,  well controlled -CBG QA CHS, with SSI  Hyponatremia 132, likely related to poor oral intake Will repeat BMP in AM    Nutritional status:  Nutrition Problem: Severe Malnutrition Etiology: chronic illness, cancer and cancer related treatments Signs/Symptoms: severe fat depletion, severe muscle depletion Interventions: Ensure Enlive (each supplement provides 350kcal and 20 grams of protein), MVI   Cultures; None    Antimicrobials: none    Consultants:  [facility]   ------------------------------------------------------------------------------------------------------------------------------------------------  DVT prophylaxis: SCD/Compression stockings Code Status:   Code Status: Full Code Family Communication:  No family member present at bedside The above findings and plan of care has been discussed with patient via interpretor Admission status:   Status is: Inpatient  Remains inpatient appropriate because:Inpatient level of care appropriate due to severity of illness   Dispo: The patient is from: Home              Anticipated d/c is to: Home              Anticipated d/c date is: likely next 24 hours pending stabilization of hemoglobin and ability to tolerate PO diet              Patient currently is not medically stable to d/c.          Procedures:   No admission procedures for hospital encounter.     Antimicrobials:  Anti-infectives (From admission, onward)   None       Medication:  . Chlorhexidine Gluconate Cloth  6 each Topical Daily  . feeding  supplement (ENSURE ENLIVE)  237 mL Oral TID BM  . insulin aspart  0-5 Units Subcutaneous QHS  . insulin aspart  0-6 Units Subcutaneous TID WC  . multivitamin with minerals  1 tablet Oral Daily  . pantoprazole (PROTONIX) IV  40 mg Intravenous Q12H  . predniSONE  10 mg Oral Q breakfast  . sucralfate  1 g Oral TID WC & HS    acetaminophen **OR** acetaminophen, magic mouthwash, ondansetron **OR** ondansetron (ZOFRAN) IV, oxyCODONE, prochlorperazine, senna-docusate   Objective:   Vitals:   10/18/19 1050 10/18/19 1052 10/18/19 1053 10/18/19 1055  BP: 96/66     Pulse: 77 73 73 75  Resp: (!) 22 15 (!) 21 (!) 23  Temp:      TempSrc:  SpO2: 98% 100% 99% 100%  Weight:      Height:        Intake/Output Summary (Last 24 hours) at 10/18/2019 1216 Last data filed at 10/18/2019 1029 Gross per 24 hour  Intake 408.93 ml  Output 600 ml  Net -191.07 ml   Filed Weights   10/16/19 1829 10/18/19 0949 10/18/19 0958  Weight: 46.9 kg 46.9 kg 46.9 kg     Examination:   Physical Exam  Constitution:  Alert, cooperative, no distress, thin and frail  Psychiatric: Normal and stable mood and affect, cognition intact,   HEENT: Normocephalic, PERRL, otherwise with in Normal limits  Chest:Chest symmetric Cardio vascular:  S1/S2, RRR, No murmurs, No Rubs or Gallops  pulmonary: Clear to auscultation bilaterally, respirations unlabored, negative wheezes / crackles Abdomen: Soft, non-tender, non-distended, bowel sounds,no masses, no organomegaly Muscular skeletal: Limited exam - in bed, able to move all 4 extremities, Normal strength,  Neuro: CNII-XII intact. , normal motor and sensation, reflexes intact  Extremities: No pitting edema lower extremities, +2 pulses  Skin: Dry, warm to touch, negative for any Rashes, No open wounds     ------------------------------------------------------------------------------------------------------------------------------------------    LABs:  CBC Latest Ref  Rng & Units 10/17/2019 10/16/2019 10/08/2019  WBC 4.0 - 10.5 K/uL 22.8(H) 24.4(H) 36.2(H)  Hemoglobin 13.0 - 17.0 g/dL 8.9(L) 5.9(LL) 8.0(L)  Hematocrit 39.0 - 52.0 % 26.8(L) 18.7(L) 24.0(L)  Platelets 150 - 400 K/uL 472(H) 434(H) 423(H)   CMP Latest Ref Rng & Units 10/17/2019 10/16/2019 10/08/2019  Glucose 70 - 99 mg/dL 94 142(H) 117(H)  BUN 8 - 23 mg/dL 14 19 13   Creatinine 0.61 - 1.24 mg/dL 0.54(L) 0.47(L) 0.62  Sodium 135 - 145 mmol/L 132(L) 132(L) 132(L)  Potassium 3.5 - 5.1 mmol/L 4.1 4.1 4.3  Chloride 98 - 111 mmol/L 98 98 96(L)  CO2 22 - 32 mmol/L 24 23 27   Calcium 8.9 - 10.3 mg/dL 8.5(L) 8.1(L) 8.9  Total Protein 6.5 - 8.1 g/dL 6.0(L) - 6.4(L)  Total Bilirubin 0.3 - 1.2 mg/dL 0.7 - 0.3  Alkaline Phos 38 - 126 U/L 184(H) - 190(H)  AST 15 - 41 U/L 62(H) - 44(H)  ALT 0 - 44 U/L 13 - 10       Micro Results Recent Results (from the past 240 hour(s))  SARS Coronavirus 2 by RT PCR (hospital order, performed in [facility] hospital lab) Nasopharyngeal Nasopharyngeal Swab     Status: None   Collection Time: 10/16/19  4:10 PM   Specimen: Nasopharyngeal Swab  Result Value Ref Range Status   SARS Coronavirus 2 NEGATIVE NEGATIVE Final    Comment: (NOTE) SARS-CoV-2 target nucleic acids are NOT DETECTED. The SARS-CoV-2 RNA is generally detectable in upper and lower respiratory specimens during the acute phase of infection. The lowest concentration of SARS-CoV-2 viral copies this assay can detect is 250 copies / mL. A negative result does not preclude SARS-CoV-2 infection and should not be used as the sole basis for treatment or other patient management decisions.  A negative result may occur with improper specimen collection / handling, submission of specimen other than nasopharyngeal swab, presence of viral mutation(s) within the areas targeted by this assay, and inadequate number of viral copies (<250 copies / mL). A negative result must be combined with clinical observations, patient  history, and epidemiological information. Fact Sheet for Patients:   StrictlyIdeas.no Fact Sheet for Healthcare Providers: BankingDealers.co.za This test is not yet approved or cleared  by the [place] FDA and has been  authorized for detection and/or diagnosis of SARS-CoV-2 by FDA under an Emergency Use Authorization (EUA).  This EUA will remain in effect (meaning this test can be used) for the duration of the COVID-19 declaration under Section 564(b)(1) of the Act, 21 U.S.C. section 360bbb-3(b)(1), unless the authorization is terminated or revoked sooner. Performed at [facility], [place] [address], [place], [place] [address]     Radiology Reports CT Angio Chest PE W/Cm &/Or Wo Cm  Result Date: 10/03/2019 CLINICAL DATA:  Shortness of breath. Failure to thrive. Active chemotherapy. EXAM: CT ANGIOGRAPHY CHEST WITH CONTRAST TECHNIQUE: Multidetector CT imaging of the chest was performed using the standard protocol during bolus administration of intravenous contrast. Multiplanar CT image reconstructions and MIPs were obtained to evaluate the vascular anatomy. CONTRAST:  158mL OMNIPAQUE IOHEXOL 350 MG/ML SOLN COMPARISON:  Chest CT 09/16/2019 FINDINGS: Cardiovascular: There are no filling defects within the pulmonary arteries to suggest pulmonary embolus. The thoracic aorta is normal in caliber. Cannot assess for dissection given phase of contrast tailored to pulmonary artery evaluation. Minor aortic atherosclerosis right chest port in place, tip obscured by dense contrast. Heart is normal in size. No pericardial effusion. Mediastinum/Nodes: Calcified left hilar lymph nodes again seen. No evidence of noncalcified adenopathy. Thyroid gland and esophagus are unremarkable. Lungs/Pleura: Breathing motion artifact through the lung bases. There scattered subcentimeter pulmonary nodules that are unchanged from prior exam. Nodules in the left  apex series 7, images 17, 18, and 21. Nodule in the right upper lobe series 7, image 32. Subpleural right middle lobe pulmonary nodules, series 7 image 94 and 116, and paramediastinal nodule in the right middle lobe image 97. calcified granuloma in the lingula. Suggestion of subpleural nodules at the right lung base that are obscured by breathing motion. Improved bibasilar atelectasis from prior. No pleural fluid. Upper Abdomen: No hepatic metastatic disease. Known left upper quadrant mass lesion, partially included. No significant upper abdominal free fluid. Musculoskeletal: No lytic or blastic osseous lesion. No acute osseous abnormality. Generalized paucity of body fat which may represent cachexia. Review of the MIP images confirms the above findings. IMPRESSION: 1. No pulmonary embolus. 2. Multiple subcentimeter pulmonary nodules, unchanged from CT 2 and half weeks ago. At least 1 of these nodules is a calcified granuloma. Remaining nodules are indeterminate in regards to metastatic disease. 3. Improved bibasilar aeration with residual atelectasis. 4. Known left upper quadrant mass lesion, partially included. Aortic Atherosclerosis (ICD10-I70.0). Electronically Signed   By: Keith Rake M.D.   On: 10/03/2019 21:04   DG Chest Portable 1 View  Result Date: 10/16/2019 CLINICAL DATA:  Syncope EXAM: PORTABLE CHEST 1 VIEW COMPARISON:  Sep 16, 2019 FINDINGS: Port-A-Cath tip is in the superior vena cava at the cavoatrial junction. No pneumothorax. The there is a granuloma in the left base. The lungs elsewhere are clear. Heart size and pulmonary vascularity are normal. There is a calcified lymph node at the aortopulmonary window. No adenopathy evident. No bone lesions. IMPRESSION: Calcified granulomas. No edema or airspace opacity. Cardiac silhouette normal. Port-A-Cath tip at cavoatrial junction. No pneumothorax. Electronically Signed   By: Lowella Grip III M.D.   On: 10/16/2019 13:25    SIGNED: Arlan Organ, DO. [facility],  Pager (please use [email or web address] to page/text)  If 7PM-7AM, please contact night-coverage 10/18/2019, 12:16 PM

## 2019-10-19 LAB — BASIC METABOLIC PANEL
Anion gap: 9 (ref 5–15)
BUN: 16 mg/dL (ref 8–23)
CO2: 26 mmol/L (ref 22–32)
Calcium: 8.7 mg/dL — ABNORMAL LOW (ref 8.9–10.3)
Chloride: 98 mmol/L (ref 98–111)
Creatinine, Ser: 0.5 mg/dL — ABNORMAL LOW (ref 0.61–1.24)
GFR calc Af Amer: >60 mL/min (ref >60)
GFR calc non Af Amer: >60 mL/min (ref >60)
Glucose, Bld: 134 mg/dL — ABNORMAL HIGH (ref 70–99)
Potassium: 4 mmol/L (ref 3.5–5.1)
Sodium: 133 mmol/L — ABNORMAL LOW (ref 135–145)

## 2019-10-19 LAB — CBC
HCT: 26 % — ABNORMAL LOW (ref 39.0–52.0)
Hemoglobin: 8.5 g/dL — ABNORMAL LOW (ref 13.0–17.0)
MCH: 28.7 pg (ref 26.0–34.0)
MCHC: 32.7 g/dL (ref 30.0–36.0)
MCV: 87.8 fL (ref 80.0–100.0)
Platelets: 438 10*3/uL — ABNORMAL HIGH (ref 150–400)
RBC: 2.96 MIL/uL — ABNORMAL LOW (ref 4.22–5.81)
RDW: 18.2 % — ABNORMAL HIGH (ref 11.5–15.5)
WBC: 19.8 10*3/uL — ABNORMAL HIGH (ref 4.0–10.5)
nRBC: 0 % (ref 0.0–0.2)

## 2019-10-19 LAB — GLUCOSE, CAPILLARY: Glucose-Capillary: 156 mg/dL — ABNORMAL HIGH (ref 70–99)

## 2019-10-19 MED ORDER — SUCRALFATE 1 G PO TABS: 1.0000 g | ORAL_TABLET | Freq: Three times a day (TID) | ORAL | 1 refills | Status: AC

## 2019-10-19 MED ORDER — ONDANSETRON HCL 4 MG PO TABS: 4.0000 mg | ORAL_TABLET | Freq: Four times a day (QID) | ORAL | 0 refills | Status: AC | PRN

## 2019-10-19 MED ORDER — PANTOPRAZOLE SODIUM 40 MG PO TBEC
40.0000 mg | DELAYED_RELEASE_TABLET | Freq: Two times a day (BID) | ORAL | 1 refills | Status: AC
Start: 2019-10-19 — End: 2019-12-18

## 2019-10-19 NOTE — Progress Notes (Signed)
Reviewed AVS with patient and daughter Verdis Frederickson using interpreter. Patient and daughter verbalized an understanding.

## 2019-10-19 NOTE — Discharge Summary (Signed)
Physician Discharge Summary  Alexander Lawson DJS:[id number] DOB: 1945-09-24 DOA: 10/16/2019  PCP: Patient, No Pcp Per  Admit date: 10/16/2019 Discharge date: 10/19/2019  Admitted From: Home Disposition:  Home  Recommendations for Outpatient Follow-up:  1. Follow up with PCP in 1-2 weeks 2. Please obtain BMP/CBC in one week 3. Please follow up on the following pending results:  Home Health:No Equipment/Devices:None Discharge Condition:Stable CODE STATUS:Full Diet recommendation:Regular Brief/Interim Summary: Alexander Alvarengais a 74 y.o.malewith medical history significant ofStage 4 metastatic gastric cancer - following with Dr. Sherrillcurrentlyon [facility]. He presented for his covid vaccine today with his daughter and subsequently had a syncopal event after receiving his vaccine.Reports he has been feeling well prior to this, somewhat fatigued but not all that much different than his normal baseline around chemotherapy.   They did indicate one single episode of vomiting 3 to 4 days ago which was black in color. Further discussion patient indicates black stool over the past few days to week as well, but denies any bright red blood per rectum or hematemesis  ED Course: In ED patient was noted to have marked anemia at 5.9, leukocytosis appears to be stable over the past few weeks currently 24 with ongoing thrombocytosis as well about at baseline over the past few weeks. Patient reported black stool over the past few weeks as well as one episode of black vomit.  Hemoccult negative. ED did contact patient's primary oncologist Dr. Benay Spice as well as GI on call for further evaluation and treatment.  2 unit PRBC ordered in the ED. Otherwise patient received IV Protonix x1 and 500 cc sodium chloride bolus for borderline hypotension.   10/17/2019 -patient was seen and examined, remained stable status post 2 units of packed red blood cell transfusion, H&H is improved from 5.9-8.9 -no  further nausea vomiting diarrhea Pending GI consultation, pending oncology report  10/18/2019 - Underwent EGD with large, infiltrative, non-circumferential mass, friable with oozing bleeding and stigmata of recent bleeding was found in the cardia and in the gastric body. The mass extends from EG junction to the body alongthe lesser curvature, is non obstructive  10/19/2019 - He reported feeling much improved and tolerated ~75% of his breakfast while also drinking an ensure. Discussed discharge with his daughter in law over the phone who reports that patient has a lot of help at home including his daughter who provides the majority of his care. Patient also confirms this through the help of an interpretor. He states that he feels like he is back to his normal and wants to go home. He will be discharged in stable condition although he is high risk for re admission given his known gastric malignancy and severe protein calorie malnutrition. He will be discharged on protonix 40 mg BID and sucralfate 1 g TID HS as well. He will follow up with his oncologist on 6/10.   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)   Anemia associated with chemotherapy   Leukocytosis   Syncope and collapse   Protein-calorie malnutrition, severe   Dysphagia   Acute blood loss anemia    Discharge Instructions Follow up with oncology Recommend patient obtain primary care provider  Allergies as of 10/19/2019   No Known Allergies     Medication List    STOP taking these medications   ferrous sulfate 325 (65 FE) MG tablet   polyethylene glycol 17 g packet Commonly known  as: MIRALAX / GLYCOLAX     TAKE these medications   diclofenac Sodium 1 % Gel Commonly known as: VOLTAREN Apply 2 g topically 4 (four) times daily as needed (pain).   insulin NPH Human 100 UNIT/ML injection Commonly known as: NOVOLIN  N Inject 5-10 Units into the skin daily as needed (5 units if BS over 150/10 units if BS over 200).   lidocaine-prilocaine cream Commonly known as: EMLA Apply 1 application topically as directed. Apply to port site 1 hour prior to stick and cover with plastic wrap. Start with 2nd chemo treatment   magic mouthwash Soln Take 5 mLs by mouth 4 (four) times daily as needed for mouth pain (swish and spit).   ondansetron 4 MG tablet Commonly known as: ZOFRAN Take 1 tablet (4 mg total) by mouth every 6 (six) hours as needed for nausea.   oxyCODONE 5 MG immediate release tablet Commonly known as: Roxicodone Take 1 tablet (5 mg total) by mouth every 6 (six) hours as needed for severe pain.   pantoprazole 40 MG tablet Commonly known as: Protonix Take 1 tablet (40 mg total) by mouth 2 (two) times daily.   predniSONE 10 MG tablet Commonly known as: DELTASONE Take 1 tablet (10 mg total) by mouth daily with breakfast.   prochlorperazine 10 MG tablet Commonly known as: COMPAZINE Take 1 tablet (10 mg total) by mouth every 6 (six) hours as needed for nausea.   sucralfate 1 g tablet Commonly known as: CARAFATE Take 1 tablet (1 g total) by mouth 4 (four) times daily -  with meals and at bedtime.      Follow-up Information    [facility] Follow up in 1 week(s).          No Known Allergies  Consultations:  [facility]    Procedures/Studies: CT Angio Chest PE W/Cm &/Or Wo Cm  Result Date: 10/03/2019 CLINICAL DATA:  Shortness of breath. Failure to thrive. Active chemotherapy. EXAM: CT ANGIOGRAPHY CHEST WITH CONTRAST TECHNIQUE: Multidetector CT imaging of the chest was performed using the standard protocol during bolus administration of intravenous contrast. Multiplanar CT image reconstructions and MIPs were obtained to evaluate the vascular anatomy. CONTRAST:  138mL OMNIPAQUE IOHEXOL 350 MG/ML SOLN COMPARISON:  Chest CT 09/16/2019 FINDINGS: Cardiovascular: There are  no filling defects within the pulmonary arteries to suggest pulmonary embolus. The thoracic aorta is normal in caliber. Cannot assess for dissection given phase of contrast tailored to pulmonary artery evaluation. Minor aortic atherosclerosis right chest port in place, tip obscured by dense contrast. Heart is normal in size. No pericardial effusion. Mediastinum/Nodes: Calcified left hilar lymph nodes again seen. No evidence of noncalcified adenopathy. Thyroid gland and esophagus are unremarkable. Lungs/Pleura: Breathing motion artifact through the lung bases. There scattered subcentimeter pulmonary nodules that are unchanged from prior exam. Nodules in the left apex series 7, images 17, 18, and 21. Nodule in the right upper lobe series 7, image 32. Subpleural right middle lobe pulmonary nodules, series 7 image 94 and 116, and paramediastinal nodule in the right middle lobe image 97. calcified granuloma in the lingula. Suggestion of subpleural nodules at the right lung base that are obscured by breathing motion. Improved bibasilar atelectasis from prior. No pleural fluid. Upper Abdomen: No hepatic metastatic disease. Known left upper quadrant mass lesion, partially included. No significant upper abdominal free fluid. Musculoskeletal: No lytic or blastic osseous lesion. No acute osseous abnormality. Generalized paucity of body fat which may represent cachexia. Review of the MIP images confirms  the above findings. IMPRESSION: 1. No pulmonary embolus. 2. Multiple subcentimeter pulmonary nodules, unchanged from CT 2 and half weeks ago. At least 1 of these nodules is a calcified granuloma. Remaining nodules are indeterminate in regards to metastatic disease. 3. Improved bibasilar aeration with residual atelectasis. 4. Known left upper quadrant mass lesion, partially included. Aortic Atherosclerosis (ICD10-I70.0). Electronically Signed   By: Keith Rake M.D.   On: 10/03/2019 21:04   DG Chest Portable 1  View  Result Date: 10/16/2019 CLINICAL DATA:  Syncope EXAM: PORTABLE CHEST 1 VIEW COMPARISON:  Sep 16, 2019 FINDINGS: Port-A-Cath tip is in the superior vena cava at the cavoatrial junction. No pneumothorax. The there is a granuloma in the left base. The lungs elsewhere are clear. Heart size and pulmonary vascularity are normal. There is a calcified lymph node at the aortopulmonary window. No adenopathy evident. No bone lesions. IMPRESSION: Calcified granulomas. No edema or airspace opacity. Cardiac silhouette normal. Port-A-Cath tip at cavoatrial junction. No pneumothorax. Electronically Signed   By: Lowella Grip III M.D.   On: 10/16/2019 13:25    EGD: Findings:      No gross lesions were noted in the entire esophagus.      A large, infiltrative, non-circumferential mass, friable with oozing       bleeding and stigmata of recent bleeding was found in the cardia and in       the gastric body. The mass extends from EG junction to the body along       the lesser curvature, is non obstructive      The examined duodenum was normal.  Impression:               - No gross lesions in esophagus. - Known malignant gastric tumor in the cardia and  in the gastric body. - Normal examined duodenum. - No specimens collected.   Subjective: Assistance via video interpretor. Patient reports feeling much improved with no nausea noted. He tolerated his breakfast this morning. No complaints.   Discharge Exam: Vitals:   10/18/19 2035 10/19/19 0640  BP: 96/67 (!) 94/59  Pulse: 85 94  Resp: 15 16  Temp: 98.3 F (36.8 C) 97.8 F (36.6 C)  SpO2: 100% 100%   Vitals:   10/18/19 1055 10/18/19 1252 10/18/19 2035 10/19/19 0640  BP:  110/67 96/67 (!) 94/59  Pulse: 75 93 85 94  Resp: (!) 23 18 15 16   Temp:  (!) 97.5 F (36.4 C) 98.3 F (36.8 C) 97.8 F (36.6 C)  TempSrc:  Oral Oral Oral  SpO2: 100% 100% 100% 100%  Weight:      Height:        General: Pt is alert, awake, not in acute distress,  frail, very thin Cardiovascular: RRR, S1/S2 +, no rubs, no gallops Respiratory: CTA bilaterally, no wheezing, no rhonchi Abdominal: Soft, NT, ND, bowel sounds + Extremities: no edema, no cyanosis    The results of significant diagnostics from this hospitalization (including imaging, microbiology, ancillary and laboratory) are listed below for reference.     Microbiology: Recent Results (from the past 240 hour(s))  SARS Coronavirus 2 by RT PCR (hospital order, performed in [facility] hospital lab) Nasopharyngeal Nasopharyngeal Swab     Status: None   Collection Time: 10/16/19  4:10 PM   Specimen: Nasopharyngeal Swab  Result Value Ref Range Status   SARS Coronavirus 2 NEGATIVE NEGATIVE Final    Comment: (NOTE) SARS-CoV-2 target nucleic acids are NOT DETECTED. The SARS-CoV-2 RNA is generally  detectable in upper and lower respiratory specimens during the acute phase of infection. The lowest concentration of SARS-CoV-2 viral copies this assay can detect is 250 copies / mL. A negative result does not preclude SARS-CoV-2 infection and should not be used as the sole basis for treatment or other patient management decisions.  A negative result may occur with improper specimen collection / handling, submission of specimen other than nasopharyngeal swab, presence of viral mutation(s) within the areas targeted by this assay, and inadequate number of viral copies (<250 copies / mL). A negative result must be combined with clinical observations, patient history, and epidemiological information. Fact Sheet for Patients:   StrictlyIdeas.no Fact Sheet for Healthcare Providers: BankingDealers.co.za This test is not yet approved or cleared  by the [place] FDA and has been authorized for detection and/or diagnosis of SARS-CoV-2 by FDA under an Emergency Use Authorization (EUA).  This EUA will remain in effect (meaning this test can be used) for  the duration of the COVID-19 declaration under Section 564(b)(1) of the Act, 21 U.S.C. section 360bbb-3(b)(1), unless the authorization is terminated or revoked sooner. Performed at [facility], [place] [address] [place], [place] [address]      Labs: BNP (last 3 results) No results for input(s): BNP in the last 8760 hours. Basic Metabolic Panel: Recent Labs  Lab 10/16/19 1216 10/17/19 0339 10/19/19 0430  NA 132* 132* 133*  K 4.1 4.1 4.0  CL 98 98 98  CO2 23 24 26   GLUCOSE 142* 94 134*  BUN 19 14 16   CREATININE 0.47* 0.54* 0.50*  CALCIUM 8.1* 8.5* 8.7*   Liver Function Tests: Recent Labs  Lab 10/17/19 0339  AST 62*  ALT 13  ALKPHOS 184*  BILITOT 0.7  PROT 6.0*  ALBUMIN 2.5*   No results for input(s): LIPASE, AMYLASE in the last 168 hours. No results for input(s): AMMONIA in the last 168 hours. CBC: Recent Labs  Lab 10/16/19 1216 10/17/19 0339 10/18/19 1637 10/19/19 0430  WBC 24.4* 22.8* 27.6* 19.8*  NEUTROABS 22.4*  --   --   --   HGB 5.9* 8.9* 8.5* 8.5*  HCT 18.7* 26.8* 25.7* 26.0*  MCV 88.6 87.9 88.6 87.8  PLT 434* 472* 454* 438*   Cardiac Enzymes: No results for input(s): CKTOTAL, CKMB, CKMBINDEX, TROPONINI in the last 168 hours. BNP: Invalid input(s): POCBNP CBG: Recent Labs  Lab 10/18/19 1003 10/18/19 1107 10/18/19 1635 10/18/19 2036 10/19/19 0757  GLUCAP 163* 141* 192* 158* 156*   D-Dimer No results for input(s): DDIMER in the last 72 hours. Hgb A1c No results for input(s): HGBA1C in the last 72 hours. Lipid Profile No results for input(s): CHOL, HDL, LDLCALC, TRIG, CHOLHDL, LDLDIRECT in the last 72 hours. Thyroid function studies No results for input(s): TSH, T4TOTAL, T3FREE, THYROIDAB in the last 72 hours.  Invalid input(s): FREET3 Anemia work up No results for input(s): VITAMINB12, FOLATE, FERRITIN, TIBC, IRON, RETICCTPCT in the last 72 hours. Urinalysis    Component Value Date/Time   COLORURINE YELLOW  10/03/2019 1719   APPEARANCEUR CLEAR 10/03/2019 1719   LABSPEC 1.027 10/03/2019 1719   PHURINE 6.0 10/03/2019 1719   GLUCOSEU NEGATIVE 10/03/2019 1719   HGBUR MODERATE (A) 10/03/2019 1719   BILIRUBINUR NEGATIVE 10/03/2019 1719   KETONESUR 80 (A) 10/03/2019 1719   PROTEINUR NEGATIVE 10/03/2019 1719   NITRITE NEGATIVE 10/03/2019 1719   LEUKOCYTESUR NEGATIVE 10/03/2019 1719   Sepsis Labs Invalid input(s): PROCALCITONIN,  WBC,  LACTICIDVEN Microbiology Recent Results (from the past  240 hour(s))  SARS Coronavirus 2 by RT PCR (hospital order, performed in [facility] hospital lab) Nasopharyngeal Nasopharyngeal Swab     Status: None   Collection Time: 10/16/19  4:10 PM   Specimen: Nasopharyngeal Swab  Result Value Ref Range Status   SARS Coronavirus 2 NEGATIVE NEGATIVE Final    Comment: (NOTE) SARS-CoV-2 target nucleic acids are NOT DETECTED. The SARS-CoV-2 RNA is generally detectable in upper and lower respiratory specimens during the acute phase of infection. The lowest concentration of SARS-CoV-2 viral copies this assay can detect is 250 copies / mL. A negative result does not preclude SARS-CoV-2 infection and should not be used as the sole basis for treatment or other patient management decisions.  A negative result may occur with improper specimen collection / handling, submission of specimen other than nasopharyngeal swab, presence of viral mutation(s) within the areas targeted by this assay, and inadequate number of viral copies (<250 copies / mL). A negative result must be combined with clinical observations, patient history, and epidemiological information. Fact Sheet for Patients:   StrictlyIdeas.no Fact Sheet for Healthcare Providers: BankingDealers.co.za This test is not yet approved or cleared  by the [place] FDA and has been authorized for detection and/or diagnosis of SARS-CoV-2 by FDA under an Emergency Use  Authorization (EUA).  This EUA will remain in effect (meaning this test can be used) for the duration of the COVID-19 declaration under Section 564(b)(1) of the Act, 21 U.S.C. section 360bbb-3(b)(1), unless the authorization is terminated or revoked sooner. Performed at [facility], [place] [address] [place], [place] [address]      Time coordinating discharge: Over 30 minutes  SIGNED:   Arlan Organ, DO [facility] 10/19/2019, 1:58 PM   If 7PM-7AM, please contact night-coverage [email or web address]

## 2019-10-19 NOTE — Evaluation (Signed)
Physical Therapy Evaluation Patient Details Name: Alexander Lawson MRN: [id number] DOB: 07-29-1945 Today's Date: 10/19/2019   History of Present Illness  74 yo male admitted with syncope, anemia. Hx of stage IV met gastic ca, anemia, dysphagia  Clinical Impression  On eval, pt was Supv-Min guard assist for mobility. He walked ~150 feet with a RW. He tolerated activity well for the most part. Towards then end of the ambulation distance, pt c/o some lightheadedness and fatigue and requested to sit down. BP 97/67. Plan is for pt to return home where he lives with his daughter. He already has a RW. Will continue to follow and progress activity as tolerated.     Follow Up Recommendations No PT follow up;Supervision for mobility/OOB    Equipment Recommendations  None recommended by PT    Recommendations for Other Services       Precautions / Restrictions Precautions Precautions: Fall Precaution Comments: dizziness/lightheadedness Restrictions Weight Bearing Restrictions: No      Mobility  Bed Mobility Overal bed mobility: Needs Assistance Bed Mobility: Supine to Sit     Supine to sit: Supervision;HOB elevated        Transfers Overall transfer level: Needs assistance Equipment used: Rolling walker (2 wheeled) Transfers: Sit to/from Stand Sit to Stand: Supervision            Ambulation/Gait Ambulation/Gait assistance: Min guard Gait Distance (Feet): 150 Feet Assistive device: Rolling walker (2 wheeled) Gait Pattern/deviations: Step-through pattern;Decreased stride length     General Gait Details: slow but steady gait with use of RW. Pt c/o lightheadedness towards end of walk-requested to sit down. BP 97/67  Stairs            Wheelchair Mobility    Modified Rankin (Stroke Patients Only)       Balance Overall balance assessment: Needs assistance           Standing balance-Leahy Scale: Poor                               Pertinent  Vitals/Pain Pain Assessment: No/denies pain    Home Living Family/patient expects to be discharged to:: Private residence Living Arrangements: Children Available Help at Discharge: Family Type of Home: House Home Access: Stairs to enter Entrance Stairs-Rails: None Technical brewer of Steps: 3 Home Layout: One level Home Equipment: Environmental consultant - 2 wheels      Prior Function           Comments: uses RW for ambulation     Hand Dominance        Extremity/Trunk Assessment   Upper Extremity Assessment Upper Extremity Assessment: Defer to OT evaluation    Lower Extremity Assessment Lower Extremity Assessment: Generalized weakness    Cervical / Trunk Assessment Cervical / Trunk Assessment: Kyphotic  Communication   Communication: Prefers language other than English(Spanish)  Cognition Arousal/Alertness: Awake/alert Behavior During Therapy: WFL for tasks assessed/performed Overall Cognitive Status: Difficult to assess                                 General Comments: appears [facility]      General Comments      Exercises     Assessment/Plan    PT Assessment Patient needs continued PT services  PT Problem List Decreased mobility;Decreased balance       PT Treatment Interventions Gait training;Therapeutic activities;Patient/family education;DME instruction;Functional mobility training  PT Goals (Current goals can be found in the Care Plan section)  Acute Rehab PT Goals Patient Stated Goal: none stated PT Goal Formulation: With patient/family Time For Goal Achievement: 11/02/19 Potential to Achieve Goals: Good    Frequency Min 3X/week   Barriers to discharge        Co-evaluation               AM-PAC PT "6 Clicks" Mobility  Outcome Measure Help needed turning from your back to your side while in a flat bed without using bedrails?: A Little Help needed moving from lying on your back to sitting on the side of a flat bed without using  bedrails?: A Little Help needed moving to and from a bed to a chair (including a wheelchair)?: A Little Help needed standing up from a chair using your arms (e.g., wheelchair or bedside chair)?: A Little Help needed to walk in hospital room?: A Little Help needed climbing 3-5 steps with a railing? : A Little 6 Click Score: 18    End of Session Equipment Utilized During Treatment: Gait belt Activity Tolerance: Patient tolerated treatment well Patient left: in chair;with call bell/phone within reach;with family/visitor present   PT Visit Diagnosis: Unsteadiness on feet (R26.81)    Time: 9969-2493 PT Time Calculation (min) (ACUTE ONLY): 19 min   Charges:   PT Evaluation $PT Eval Low Complexity: 1 Low            Wanza Szumski P, PT Acute Rehabilitation

## 2019-10-19 NOTE — TOC Initial Note (Signed)
Transition of Care [facility]) - Initial/Assessment Note    Patient Details  Name: Alexander Lawson MRN: [id number] Date of Birth: 01/17/1946  Transition of Care [facility]) CM/SW Contact:    Joaquin Courts, RN Phone Number: 10/19/2019, 11:03 AM  Clinical Narrative:                 CM spoke with patient's daughter who reports she is interested in [facility] services for patient and understands that it would be an out of pocket expense.  After discussing patient's needs, daughter states she needs aide services and PT services.  CM discussed that aide services are a home care not [facility] services and family would need to arrange those services as they will need to determine how many hours of care they want or can afford.  CM explained that rates vary between agencies and daughter will have to reach out to some local home care agencies to see what the cost would be.  HHPT also typically ranges 150-200$ per visit, but it many be more affordable for patient to do outpatient services.  Daughter will discuss everything with the rest of her family members before making any further decisions.   Expected Discharge Plan: Home/Self Care Barriers to Discharge: No Barriers Identified   Patient Goals and CMS Choice Patient states their goals for this hospitalization and ongoing recovery are:: to go home      Expected Discharge Plan and Services Expected Discharge Plan: Home/Self Care   Discharge Planning Services: CM Consult   Living arrangements for the past 2 months: Single Family Home Expected Discharge Date: 10/19/19               DME Arranged: N/A DME Agency: NA       HH Arranged: NA [place] Agency: NA        Prior Living Arrangements/Services Living arrangements for the past 2 months: Single Family Home Lives with:: Adult Children Patient language and need for interpreter reviewed:: Yes Do you feel safe going back to the place where you live?: Yes      Need for Family Participation in Patient Care: Yes  (Comment) Care giver support system in place?: Yes (comment)   Criminal Activity/Legal Involvement Pertinent to Current Situation/Hospitalization: No - Comment as needed  Activities of Daily Living Home Assistive Devices/Equipment: Walker (specify type) ADL Screening (condition at time of admission) Patient's cognitive ability adequate to safely complete daily activities?: Yes Is the patient deaf or have difficulty hearing?: No Does the patient have difficulty seeing, even when wearing glasses/contacts?: No Does the patient have difficulty concentrating, remembering, or making decisions?: No Patient able to express need for assistance with ADLs?: Yes Does the patient have difficulty dressing or bathing?: Yes Independently performs ADLs?: No(1:1 assist) Communication: Independent(Speaks ONLY Spanish) Is this a change from baseline?: Pre-admission baseline Dressing (OT): Needs assistance Is this a change from baseline?: Pre-admission baseline Grooming: Needs assistance Is this a change from baseline?: Pre-admission baseline Feeding: Needs assistance, Dependent Is this a change from baseline?: Pre-admission baseline Bathing: Needs assistance Is this a change from baseline?: Pre-admission baseline Toileting: Needs assistance Is this a change from baseline?: Pre-admission baseline In/Out Bed: Needs assistance Is this a change from baseline?: Pre-admission baseline Walks in Home: Needs assistance Is this a change from baseline?: Pre-admission baseline Does the patient have difficulty walking or climbing stairs?: No Weakness of Legs: Both Weakness of Arms/Hands: Both  Permission Sought/Granted  Emotional Assessment Appearance:: Appears stated age         Psych Involvement: No (comment)  Admission diagnosis:  Syncope and collapse [R55] Symptomatic anemia [D64.9] Anemia, unspecified type [D64.9] Patient Active Problem List   Diagnosis Date Noted  .  Protein-calorie malnutrition, severe 10/18/2019  . Acute blood loss anemia 10/18/2019  . Dysphagia   . Symptomatic anemia 10/16/2019  . Leukocytosis 10/16/2019  . Syncope and collapse 10/16/2019  . Aspiration pneumonia ([place]) 09/17/2019  . Sepsis ([place]) 09/17/2019  . Neutropenia with fever ([place]) 09/17/2019  . Anemia associated with chemotherapy 09/17/2019  . 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 Gastric cancer ([place]) 08/29/2019  . Goals of care, counseling/discussion 08/29/2019   PCP:  Patient, No Pcp Per Pharmacy:   [id number] [address], [place] [address] Phone: [phone] Fax: [phone]  [place], [place] [address] Phone: [phone] Fax: [phone]     Social Determinants of Health (SDOH) Interventions    Readmission Risk Interventions No flowsheet data found.

## 2019-10-21 ENCOUNTER — Encounter: Payer: Self-pay | Admitting: *Deleted

## 2019-10-22 MED FILL — Dexamethasone Sodium Phosphate Inj 100 MG/10ML: INTRAMUSCULAR | Qty: 1 | Status: AC

## 2019-10-23 ENCOUNTER — Inpatient Hospital Stay: Payer: Self-pay

## 2019-10-23 ENCOUNTER — Emergency Department (HOSPITAL_COMMUNITY)
Admission: EM | Admit: 2019-10-23 | Discharge: 2019-10-23 | Disposition: A | Discharge status: Home / Self Care | Payer: Self-pay | Attending: Emergency Medicine | Admitting: Emergency Medicine

## 2019-10-23 ENCOUNTER — Inpatient Hospital Stay: Payer: Self-pay | Admitting: Nutrition

## 2019-10-23 ENCOUNTER — Other Ambulatory Visit: Payer: Self-pay

## 2019-10-23 ENCOUNTER — Inpatient Hospital Stay: Payer: Self-pay | Admitting: Nurse Practitioner

## 2019-10-23 ENCOUNTER — Inpatient Hospital Stay: Payer: Self-pay | Attending: Nurse Practitioner

## 2019-10-23 ENCOUNTER — Other Ambulatory Visit: Payer: Self-pay | Admitting: Nurse Practitioner

## 2019-10-23 DIAGNOSIS — Z794 Long term (current) use of insulin: Secondary | ICD-10-CM | POA: Insufficient documentation

## 2019-10-23 DIAGNOSIS — G893 Neoplasm related pain (acute) (chronic): Secondary | ICD-10-CM | POA: Insufficient documentation

## 2019-10-23 DIAGNOSIS — R42 Dizziness and giddiness: Secondary | ICD-10-CM | POA: Insufficient documentation

## 2019-10-23 DIAGNOSIS — E119 Type 2 diabetes mellitus without complications: Secondary | ICD-10-CM | POA: Insufficient documentation

## 2019-10-23 DIAGNOSIS — Z79899 Other long term (current) drug therapy: Secondary | ICD-10-CM | POA: Insufficient documentation

## 2019-10-23 DIAGNOSIS — E860 Dehydration: Secondary | ICD-10-CM | POA: Insufficient documentation

## 2019-10-23 DIAGNOSIS — Z8507 Personal history of malignant neoplasm of pancreas: Secondary | ICD-10-CM | POA: Insufficient documentation

## 2019-10-23 DIAGNOSIS — K5900 Constipation, unspecified: Secondary | ICD-10-CM | POA: Insufficient documentation

## 2019-10-23 DIAGNOSIS — C160 Malignant neoplasm of cardia: Secondary | ICD-10-CM | POA: Insufficient documentation

## 2019-10-23 DIAGNOSIS — Z9221 Personal history of antineoplastic chemotherapy: Secondary | ICD-10-CM | POA: Insufficient documentation

## 2019-10-23 DIAGNOSIS — K1231 Oral mucositis (ulcerative) due to antineoplastic therapy: Secondary | ICD-10-CM | POA: Insufficient documentation

## 2019-10-23 DIAGNOSIS — D500 Iron deficiency anemia secondary to blood loss (chronic): Secondary | ICD-10-CM | POA: Insufficient documentation

## 2019-10-23 DIAGNOSIS — R531 Weakness: Secondary | ICD-10-CM | POA: Insufficient documentation

## 2019-10-23 DIAGNOSIS — R634 Abnormal weight loss: Secondary | ICD-10-CM | POA: Insufficient documentation

## 2019-10-23 DIAGNOSIS — R630 Anorexia: Secondary | ICD-10-CM | POA: Insufficient documentation

## 2019-10-23 DIAGNOSIS — C787 Secondary malignant neoplasm of liver and intrahepatic bile duct: Secondary | ICD-10-CM | POA: Insufficient documentation

## 2019-10-23 DIAGNOSIS — Z515 Encounter for palliative care: Secondary | ICD-10-CM

## 2019-10-23 DIAGNOSIS — C169 Malignant neoplasm of stomach, unspecified: Secondary | ICD-10-CM

## 2019-10-23 DIAGNOSIS — D649 Anemia, unspecified: Secondary | ICD-10-CM

## 2019-10-23 DIAGNOSIS — Z87891 Personal history of nicotine dependence: Secondary | ICD-10-CM | POA: Insufficient documentation

## 2019-10-23 LAB — BASIC METABOLIC PANEL
Anion gap: 10 (ref 5–15)
BUN: 18 mg/dL (ref 8–23)
CO2: 25 mmol/L (ref 22–32)
Calcium: 8.6 mg/dL — ABNORMAL LOW (ref 8.9–10.3)
Chloride: 94 mmol/L — ABNORMAL LOW (ref 98–111)
Creatinine, Ser: 0.52 mg/dL — ABNORMAL LOW (ref 0.61–1.24)
GFR calc Af Amer: >60 mL/min (ref >60)
GFR calc non Af Amer: >60 mL/min (ref >60)
Glucose, Bld: 169 mg/dL — ABNORMAL HIGH (ref 70–99)
Potassium: 4.6 mmol/L (ref 3.5–5.1)
Sodium: 129 mmol/L — ABNORMAL LOW (ref 135–145)

## 2019-10-23 LAB — CBC WITH DIFFERENTIAL/PLATELET
Abs Immature Granulocytes: 0.13 10*3/uL — ABNORMAL HIGH (ref 0.00–0.07)
Basophils Absolute: 0 10*3/uL (ref 0.0–0.1)
Basophils Relative: 0 %
Eosinophils Absolute: 0 10*3/uL (ref 0.0–0.5)
Eosinophils Relative: 0 %
HCT: 24.7 % — ABNORMAL LOW (ref 39.0–52.0)
Hemoglobin: 7.8 g/dL — ABNORMAL LOW (ref 13.0–17.0)
Immature Granulocytes: 1 %
Lymphocytes Relative: 8 %
Lymphs Abs: 1.3 10*3/uL (ref 0.7–4.0)
MCH: 28.5 pg (ref 26.0–34.0)
MCHC: 31.6 g/dL (ref 30.0–36.0)
MCV: 90.1 fL (ref 80.0–100.0)
Monocytes Absolute: 1 10*3/uL (ref 0.1–1.0)
Monocytes Relative: 6 %
Neutro Abs: 15.3 10*3/uL — ABNORMAL HIGH (ref 1.7–7.7)
Neutrophils Relative %: 85 %
Platelets: 479 10*3/uL — ABNORMAL HIGH (ref 150–400)
RBC: 2.74 MIL/uL — ABNORMAL LOW (ref 4.22–5.81)
RDW: 17.6 % — ABNORMAL HIGH (ref 11.5–15.5)
WBC: 17.9 10*3/uL — ABNORMAL HIGH (ref 4.0–10.5)
nRBC: 0 % (ref 0.0–0.2)

## 2019-10-23 LAB — URINALYSIS, ROUTINE W REFLEX MICROSCOPIC
Bilirubin Urine: NEGATIVE
Glucose, UA: NEGATIVE mg/dL
Hgb urine dipstick: NEGATIVE
Ketones, ur: 20 mg/dL — AB
Leukocytes,Ua: NEGATIVE
Nitrite: NEGATIVE
Protein, ur: NEGATIVE mg/dL
Specific Gravity, Urine: 1.02 (ref 1.005–1.030)
pH: 6 (ref 5.0–8.0)

## 2019-10-23 LAB — PREPARE RBC (CROSSMATCH)

## 2019-10-23 MED ORDER — SODIUM CHLORIDE 0.9 % IV BOLUS: 1000.0000 mL | Freq: Once | INTRAVENOUS | Status: DC

## 2019-10-23 MED ORDER — SODIUM CHLORIDE 0.9 % IV SOLN
10.0000 mL/h | Freq: Once | INTRAVENOUS | Status: AC
  Administered 2019-10-23: 10 mL/h via INTRAVENOUS

## 2019-10-23 MED ORDER — MORPHINE SULFATE 20 MG/5ML PO SOLN: 5.0000 mg | ORAL | 0 refills | Status: DC | PRN

## 2019-10-23 MED ORDER — CEFDINIR 125 MG/5ML PO SUSR: 300.0000 mg | Freq: Two times a day (BID) | ORAL | 0 refills | Status: AC

## 2019-10-23 MED ORDER — SODIUM CHLORIDE 0.9 % IV BOLUS
500.0000 mL | Freq: Once | INTRAVENOUS | Status: AC
  Administered 2019-10-23: 500 mL via INTRAVENOUS

## 2019-10-23 NOTE — Discharge Instructions (Addendum)
Alexander Lawson will likely need another blood transfusion by Monday.  He continues to have bleeding in his stomach.  Dr Gearldine Shown office will call you about an appointment on Monday.    We talked about home hospice care.  I prescribed a strong pain medication called morphine.  This is a liquid that [place] can swallow.  Please be careful - morphine helps his pain, but it will make him sleepy, confused, weak, and constipated.     I also prescribed an antibiotic called Omnicef.  This is a liquid.  You should start giving this ONLY if Alexander Lawson begins to have fevers at home (a temperature higher than 100.4 F).  You give this medicine twice per day for 5 days.

## 2019-10-23 NOTE — ED Notes (Signed)
Provider at bedside

## 2019-10-23 NOTE — ED Provider Notes (Signed)
[facility] DEPT Provider Note   CSN: [id number] Arrival date & time: 10/23/19  5053     History Chief Complaint  Patient presents with  . Weakness    Alexander Lawson is a 74 y.o. male w/ stage 4 metastatic gastric cancer, formerly on oral chemo with Dr Benay Spice ([facility]), presented to emergency department with weakness.  The patient just left the ED 5 days ago after presenting with symptomatic anemia with a hemoglobin of 5.9, requiring 2 units of packed red blood cell transfusions.  He underwent EGD with the GI service and found to have a large infiltrative mass that was oozing in the stomach.  He was discharged after symptomatic improvement.  He was felt to be high risk for recurrent admission and continued bleeding.  A spanish translator service was used  The patient reports poor appetite, continued oozing dark stool, weakness.  He felt "good" for 2 days after leaving tthe hospital but is weak again.  I also spoke to his two daughters and his grandson at bedside.  They report that they've collectively decided as a family to make him hospice and comfort care.  The patient agrees with this.  It is his wish to fly back to his home country of [place] "as soon as possible," and the family is searching for plane tickets as soon as this week.  They ask for "liquid pain medicine" for home, and are not currently interested in home hospice care.     HPI     Past Medical History:  Diagnosis Date  . Cancer ([place])    pancreas  . Diabetes mellitus without complication [facility])     Patient Active Problem List   Diagnosis Date Noted  . Protein-calorie malnutrition, severe 10/18/2019  . Acute blood loss anemia 10/18/2019  . Dysphagia   . Symptomatic anemia 10/16/2019  . Leukocytosis 10/16/2019  . Syncope and collapse 10/16/2019  . Aspiration pneumonia ([place]) 09/17/2019  . Sepsis ([place]) 09/17/2019  . Neutropenia with fever ([place]) 09/17/2019  . Anemia associated  with chemotherapy 09/17/2019  . 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 Gastric cancer ([place]) 08/29/2019  . Goals of care, counseling/discussion 08/29/2019    Past Surgical History:  Procedure Laterality Date  . ESOPHAGOGASTRODUODENOSCOPY (EGD) WITH PROPOFOL N/A 10/18/2019   Procedure: ESOPHAGOGASTRODUODENOSCOPY (EGD) WITH PROPOFOL;  Surgeon: Mauri Pole, MD;  Location: [facility] ENDOSCOPY;  Service: Endoscopy;  Laterality: N/A;  . IR IMAGING GUIDED PORT INSERTION  09/03/2019       Family History  Problem Relation Age of Onset  . Asthma Mother     Social History   Tobacco Use  . Smoking status: Former Research scientist (life sciences)  . Smokeless tobacco: Never Used  Substance Use Topics  . Alcohol use: Not Currently  . Drug use: Not Currently    Home Medications Prior to Admission medications   Medication Sig Start Date End Date Taking? Authorizing Provider  insulin NPH Human (NOVOLIN N) 100 UNIT/ML injection Inject 5-10 Units into the skin daily as needed (5 units if BS over 150/10 units if BS over 200).   Yes [provider]  lidocaine-prilocaine (EMLA) cream Apply 1 application topically as directed. Apply to port site 1 hour prior to stick and cover with plastic wrap. Start with 2nd chemo treatment 09/02/19  Yes Ladell Pier, MD  magic mouthwash SOLN Take 5 mLs by mouth 4 (four) times daily as needed for mouth pain (swish and spit). 09/10/19  Yes Ladell Pier,  MD  ondansetron (ZOFRAN) 4 MG tablet Take 1 tablet (4 mg total) by mouth every 6 (six) hours as needed for nausea. 10/19/19  Yes Arlan Organ, DO  oxyCODONE (ROXICODONE) 5 MG immediate release tablet Take 1 tablet (5 mg total) by mouth every 6 (six) hours as needed for severe pain. 10/08/19  Yes Owens Shark, NP  pantoprazole (PROTONIX) 40 MG tablet Take 1 tablet (40 mg total) by mouth 2 (two) times daily. 10/19/19 12/18/19 Yes Arlan Organ, DO  sucralfate (CARAFATE) 1 g tablet Take 1 tablet (1 g total) by mouth  4 (four) times daily -  with meals and at bedtime. 10/19/19  Yes Arlan Organ, DO  cefdinir (OMNICEF) 125 MG/5ML suspension Take 12 mLs (300 mg total) by mouth 2 (two) times daily for 5 days. 10/23/19 10/28/19  Wyvonnia Dusky, MD  morphine 20 MG/5ML solution Take 1.3 mLs (5.2 mg total) by mouth every 4 (four) hours as needed for pain. 10/23/19   Wyvonnia Dusky, MD  predniSONE (DELTASONE) 10 MG tablet Take 1 tablet (10 mg total) by mouth daily with breakfast. 10/08/19   Owens Shark, NP  prochlorperazine (COMPAZINE) 10 MG tablet Take 1 tablet (10 mg total) by mouth every 6 (six) hours as needed for nausea. Patient not taking: Reported on 10/23/2019 09/02/19   Ladell Pier, MD    Allergies    Patient has no known allergies.  Review of Systems   Review of Systems  Constitutional: Positive for appetite change. Negative for chills and fever.  HENT: Negative for ear pain and sore throat.   Eyes: Negative for photophobia and visual disturbance.  Respiratory: Negative for cough and shortness of breath.   Cardiovascular: Negative for chest pain and palpitations.  Gastrointestinal: Positive for abdominal pain and nausea. Negative for vomiting.  Genitourinary: Negative for dysuria and hematuria.  Musculoskeletal: Negative for arthralgias and myalgias.  Skin: Negative for color change and rash.  Neurological: Positive for light-headedness. Negative for syncope.  All other systems reviewed and are negative.   Physical Exam Updated Vital Signs BP 136/78   Pulse 89   Temp 97.6 F (36.4 C)   Resp (!) 21   Ht 5\' 8"  (1.727 m)   SpO2 100%   BMI 15.72 kg/m   Physical Exam Vitals and nursing note reviewed.  Constitutional:      Appearance: He is well-developed. He is ill-appearing.  HENT:     Head: Normocephalic and atraumatic.  Eyes:     Conjunctiva/sclera: Conjunctivae normal.  Cardiovascular:     Rate and Rhythm: Regular rhythm. Tachycardia present.     Pulses: Normal pulses.    Pulmonary:     Effort: Pulmonary effort is normal. No respiratory distress.  Abdominal:     General: There is no distension.     Palpations: Abdomen is soft.  Musculoskeletal:     Cervical back: Neck supple.  Skin:    General: Skin is warm and dry.  Neurological:     General: No focal deficit present.     Mental Status: He is alert and oriented to person, place, and time.     ED Results / Procedures / Treatments   Labs (all labs ordered are listed, but only abnormal results are displayed) Labs Reviewed  CBC WITH DIFFERENTIAL/PLATELET - Abnormal; Notable for the following components:      Result Value   WBC 17.9 (*)    RBC 2.74 (*)    Hemoglobin 7.8 (*)  HCT 24.7 (*)    RDW 17.6 (*)    Platelets 479 (*)    Neutro Abs 15.3 (*)    Abs Immature Granulocytes 0.13 (*)    All other components within normal limits  BASIC METABOLIC PANEL - Abnormal; Notable for the following components:   Sodium 129 (*)    Chloride 94 (*)    Glucose, Bld 169 (*)    Creatinine, Ser 0.52 (*)    Calcium 8.6 (*)    All other components within normal limits  URINALYSIS, ROUTINE W REFLEX MICROSCOPIC - Abnormal; Notable for the following components:   Ketones, ur 20 (*)    All other components within normal limits  TYPE AND SCREEN  PREPARE RBC (CROSSMATCH)    EKG None  Radiology No results found.  Procedures .Critical Care Performed by: Wyvonnia Dusky, MD Authorized by: Wyvonnia Dusky, MD   Critical care provider statement:    Critical care time (minutes):  45   Critical care was necessary to treat or prevent imminent or life-threatening deterioration of the following conditions:  Circulatory failure   Critical care was time spent personally by me on the following activities:  Discussions with consultants, evaluation of patient's response to treatment, examination of patient, ordering and performing treatments and interventions, ordering and review of laboratory studies, ordering  and review of radiographic studies, pulse oximetry, re-evaluation of patient's condition, obtaining history from patient or surrogate and review of old charts Comments:     Symptomatic anemia requiring blood transfusions Goals of care discussion with patient and family   (including critical care time)  Medications Ordered in ED Medications  0.9 %  sodium chloride infusion (0 mL/hr Intravenous Stopped 10/23/19 1158)  sodium chloride 0.9 % bolus 500 mL (0 mLs Intravenous Stopped 10/23/19 1538)    ED Course  I have reviewed the triage vital signs and the nursing notes.  Pertinent labs & imaging results that were available during my care of the patient were reviewed by me and considered in my medical decision making (see chart for details).  74 yo male w/ stage 4 metastatic gastric cancer presenting to the ED with fatigue.  Patient was hospitalized 5 days ago for symptomatic anemia felt to be 2/2 bleeding gastric mass, and transfused, with symptomatic improvement.  He returns feeling anemic again.  I suspect he continues to have internal GI bleeding.    Afebrile here and not neutropenic- doubtful of acute infection HR and BP stable  Labs personally reviewed with hgb 7.8, drop 2 grams from discharge.  Also with some hyponatremia Na 129.  UA negative.    Long discussion with family members and patient using translation services regarding goals of care, with the ultimate decision from the family and patient to pursue hospice care.  His wish is to maintain his strength enough to return to [place] as soon as possible, where he will likely die in the company of his family there.  His family asks me to help him in the interim.  I think a blood transfusion is reasonable here, we'll do 2 units PRBC.  We'll give some IV fluids as well.  I can offer liquid morphine (he cannot manage solids anymore).  I offered to get CM involved to set up home hospice, but the family does not want this, and hopes to have  him on a plane flying to [place] by next week.  I discussed the case with Dr Benay Spice as noted below.  Prior medical records were  reviewed by myself including most recent hospitalization course and EGD notes.  Additional history was provided by daughters and grandson at bedside.  Clinical Course as of Oct 22 1801  Thu Oct 23, 2019  1045 Hgb 7.8, ordered 2 units pRBC.  Family at bedside   [MT]  1326 Still getting infusions.  I spoke to Dr Benay Spice his oncologist who will make arranagements for an office follow up on Monday.  Dr Benay Spice believes there may be options still for targeted radiation for the bleeding stomach mass, but the patient and family are unsure they want to pursue this.  They agree to the office follow up.  I explained that he will likely need another transfusion at that point, given the rate he is likely bleeding.  Dr Benay Spice can help arrange for this.   [MT]  1287 I also explained to the family the importance of ensuring the patient eats boost/ensure and drinks water.  We can prescribe morphine for pain, but I explained this will weaken the patient, slow his breathing, and diminish his appetite.  If they are trying to keep up his strength for a plane ride home next week, they should minimize the morphine until he gets back to [place].  They understand this.   [MT]  1406 No evidence of urinary infection   [MT]  74 Family still at bedside, explained my plan to provide omnicef liquid antibiotics to have at home in case he spikes fevers, okay for discharge after completion of transfusion.   [MT]    Clinical Course User Index [MT] Wyvonnia Dusky, MD   Final Clinical Impression(s) / ED Diagnoses Final diagnoses:  Weakness  Anemia, unspecified type  Malignant neoplasm of stomach, unspecified location ([place])  End of life care    Rx / DC Orders ED Discharge Orders         Ordered    morphine 20 MG/5ML solution  Every 4 hours PRN     Discontinue  Reprint      10/23/19 1420    cefdinir (OMNICEF) 125 MG/5ML suspension  2 times daily     Discontinue  Reprint     10/23/19 1420           Wyvonnia Dusky, MD 10/23/19 [phone]

## 2019-10-23 NOTE — ED Triage Notes (Signed)
Patient arrived via [facility] from home Patient had chemotherapy a couple days ago and has been feeling weak since. Patient's family states he had a 101.7 fever they treated it with 2 TSP of childrens tylenol

## 2019-10-23 NOTE — ED Notes (Signed)
Pt not showing any signs of blood transfusion reaction at the time of discharge.

## 2019-10-23 NOTE — Progress Notes (Addendum)
[place]   Telephone:([phone] [phone]   Clinic Follow up Note   Patient Care Team: Patient, No Pcp Per as PCP - General (General Practice) [name], RN as Oncology Nurse Navigator 10/27/2019  CHIEF COMPLAINT: F/u gastric cancer   CURRENT THERAPY: FOLFOX plus Nivolumab, s/p cycle 2 on 09/25/19  INTERVAL HISTORY: Alexander Lawson returns for f/u as scheduled. Since last visit he was admitted to the hospital for anemia and GI bleeding from gastric tumor. He was transfused and started on PPI. He was discharged on 10/19/19 but returned to ED on 6/10 for weakness and fever 101.7 at home. CBC with hgb 7.8 with thrombocytosis and leukocytosis. He received GCSF on 09/27/19 with last cycle of chemo.   Today, he presents with family and interpreter in a wheelchair. He feels same as usual with upper abdominal pain and low back pain. Taking liquid morphine q4 and combining with oxycodone at time, which helps. He is taking liquids only, up to 3 ensure per day. He manages constipation with milk of mag, last BM yesterday. He has signs of bleeding in stool. He felt a little better after RBC transfusion and IVF in ED. He continues medication as prescribed including antibiotic. Denies fever, chills, cough, chest pain. He wants to go home to [place] as soon as possible, no flight reservation has been made.    MEDICAL HISTORY:  Past Medical History:  Diagnosis Date  . Cancer ([place])    pancreas  . Diabetes mellitus without complication ([place])     SURGICAL HISTORY: Past Surgical History:  Procedure Laterality Date  . ESOPHAGOGASTRODUODENOSCOPY (EGD) WITH PROPOFOL N/A 10/18/2019   Procedure: ESOPHAGOGASTRODUODENOSCOPY (EGD) WITH PROPOFOL;  Surgeon: Mauri Pole, MD;  Location: [facility];  Service: Endoscopy;  Laterality: N/A;  . IR IMAGING GUIDED PORT INSERTION  09/03/2019    I have reviewed the social history and family history with the patient and they are  unchanged from previous note.  ALLERGIES:  has No Known Allergies.  MEDICATIONS:  Current Outpatient Medications  Medication Sig Dispense Refill  . cefdinir (OMNICEF) 125 MG/5ML suspension Take 12 mLs (300 mg total) by mouth 2 (two) times daily for 5 days. 120 mL 0  . insulin NPH Human (NOVOLIN N) 100 UNIT/ML injection Inject 5-10 Units into the skin daily as needed (5 units if BS over 150/10 units if BS over 200).    Marland Kitchen lidocaine-prilocaine (EMLA) cream Apply 1 application topically as directed. Apply to port site 1 hour prior to stick and cover with plastic wrap. Start with 2nd chemo treatment 30 g 0  . magic mouthwash SOLN Take 5 mLs by mouth 4 (four) times daily as needed for mouth pain (swish and spit). 240 mL 0  . ondansetron (ZOFRAN) 4 MG tablet Take 1 tablet (4 mg total) by mouth every 6 (six) hours as needed for nausea. 20 tablet 0  . pantoprazole (PROTONIX) 40 MG tablet Take 1 tablet (40 mg total) by mouth 2 (two) times daily. 60 tablet 1  . predniSONE (DELTASONE) 10 MG tablet Take 1 tablet (10 mg total) by mouth daily with breakfast. 20 tablet 0  . prochlorperazine (COMPAZINE) 10 MG tablet Take 1 tablet (10 mg total) by mouth every 6 (six) hours as needed for nausea. 45 tablet 1  . sucralfate (CARAFATE) 1 g tablet Take 1 tablet (1 g total) by mouth 4 (four) times daily -  with meals and at bedtime. 120 tablet 1  . morphine (ROXANOL) 20  MG/ML concentrated solution Take 0.5-1 mLs (10-20 mg total) by mouth every 4 (four) hours as needed for severe pain. 240 mL 0   Current Facility-Administered Medications  Medication Dose Route Frequency Provider Last Rate Last Admin  . 0.9 %  sodium chloride infusion   Intravenous Once Alla Feeling, NP 500 mL/hr at 10/27/19 1307 New Bag at 10/27/19 1307  . [START ON 10/28/2019] 0.9 %  sodium chloride infusion   Intravenous Once Cira Rue K, NP      . morphine 2 MG/ML injection 2 mg  2 mg Intravenous Once Cira Rue K, NP      . ondansetron  [facility]) injection 8 mg  8 mg Intravenous Once Cira Rue K, NP        PHYSICAL EXAMINATION:  Vitals:   10/27/19 1132 10/27/19 1139  BP: 99/71   Pulse:  98  Resp:    Temp:    SpO2:     Filed Weights   10/27/19 1129  Weight: 103 lb (46.7 kg)    GENERAL:awake but lethargic, cachetic; in no acute distress SKIN: dry, no rash  EYES: sclera clear LUNGS: clear with normal breathing effort HEART: regular rate & rhythm, no lower extremity edema ABDOMEN: no definite mass, diffuse tenderness. Exam limited by position in wheelchair.  Musculoskeletal: generalized weakness  NEURO: awake, answers appropriately & oriented x 3 with fluent speech PAC without erythema   LABORATORY DATA:  I have reviewed the data as listed CBC Latest Ref Rng & Units 10/27/2019 10/23/2019 10/19/2019  WBC 4.0 - 10.5 K/uL 14.9(H) 17.9(H) 19.8(H)  Hemoglobin 13.0 - 17.0 g/dL 10.5(L) 7.8(L) 8.5(L)  Hematocrit 39 - 52 % 31.5(L) 24.7(L) 26.0(L)  Platelets 150 - 400 K/uL 549(H) 479(H) 438(H)     CMP Latest Ref Rng & Units 10/27/2019 10/23/2019 10/19/2019  Glucose 70 - 99 mg/dL 124(H) 169(H) 134(H)  BUN 8 - 23 mg/dL _0 Creatinine 0.61 - 1.24 mg/dL 0.54(L) 0.52(L) 0.50(L)  Sodium 135 - 145 mmol/L 133(L) 129(L) 133(L)  Potassium 3.5 - 5.1 mmol/L 4.3 4.6 4.0  Chloride 98 - 111 mmol/L 95(L) 94(L) 98  CO2 22 - 32 mmol/L _1 Calcium 8.9 - 10.3 mg/dL 9.1 8.6(L) 8.7(L)  Total Protein 6.5 - 8.1 g/dL 6.7 - -  Total Bilirubin 0.3 - 1.2 mg/dL 0.4 - -  Alkaline Phos 38 - 126 U/L 231(H) - -  AST 15 - 41 U/L 62(H) - -  ALT 0 - 44 U/L 13 - -      RADIOGRAPHIC STUDIES: I have personally reviewed the radiological images as listed and agreed with the findings in the report. No results found.   ASSESSMENT & PLAN:   1. Metastatic gastric cancer  EGD 08/15/2019-large ulcerated lesion in the gastric cardia immediately below the GE junction, malignant appearing. Biopsy-adenocarcinoma, poorly differentiated; HER-2  negative, PD1 combined positive score 3  CT abdomen/pelvis August 25, 2019-large centrally necrotic mass left upper quadrant of the abdomen measuring 12.2 x 9.0 x 8.6 cm, thick irregular enhancing walls, central low density and multiple central air bubbles; the mass is located between the liver, stomach and pancreas and appears inseparable from the lesser curvature of the stomach. Multiple liver lesions throughout all segments. Metastasis within the caudate lobe of the liver exerts mild mass-effect on the IVC which remains patent. Multiple small lymph nodes in the gastrohepatic ligament and retroperitoneum, not pathologically enlarged.  Cycle 1 FOLFOX 09/04/2019  Cycle 2 FOLFOX plus nivolumab 09/24/2019, Udenyca added,  5-FU dose reduced  EGD 10/18/2019-large gastric cardia/body mass with oozing  ED visit 10/23/19, RBC 2 units for symptomatic anemia  2. Abdominal pain secondary to #1  3. Dysphagia/odynophagia secondary to #1  4. Anorexia/weight loss secondary to #1  5. Diabetes 6. Mucositis secondary to chemotherapy. Improved 09/15/2019. 7. Admission 09/16/2019 with febrile neutropenia, cough-right lower lobe consolidation on CT  Disposition:  Alexander Lawson appears weak. He completed 2 cycles of treatment, last given on 09/25/19. He did not tolerate systemic treatment well. He presented to ED on 10/23/19 for symptomatic anemia, EGD showed bleeding gastric mass. He was transfused 2 units RBCs. Repeat CBC today shows Hgb 10.5 which is likely falsely high due to dehydration. He will receive supportive IVF, anti-emetics, and pain medication today.  We discussed his goals of care. Alexander Lawson does not wish to pursue any additional cancer treatment. He desires to return home to [place], with tentative plans to fly out later this week when he feels a little stronger. We recommend his family to arrange for a doctor to see him once he arrives in his home town. They understand. We have refilled roxinol for him,  the family understands this is a higher concentration than the previous prescription.   He will return for IVF on 6/15, then for repeat CBC, follow up, and possible transfusion on 6/16. Patient was seen with Dr. Benay Spice.   Orders Placed This Encounter  Procedures  . CBC with Differential (Cancer Center Only)    Standing Status:   Future    Standing Expiration Date:   10/26/2020   All questions were answered. The patient knows to call the clinic with any problems, questions or concerns. No barriers to learning were detected with the assistance of Spanish speaking interpreter.     Alla Feeling, NP 10/27/19  This was a shared visit with Cira Rue.  Alexander Lawson was interviewed and examined.  A Spanish interpreter was present.  He has metastatic gastric cancer.  He is symptomatic from the primary tumor and bleeding.  He has decided against further systemic therapy.  He wants to return home to the [place] for comfort care measures.  We prescribed Roxanol for pain.  He will receive intravenous fluids for the next several days and transfusion support as needed.  He will try to arrange for a flight to the [place] later this week.  Julieanne Manson, MD

## 2019-10-24 LAB — TYPE AND SCREEN
ABO/RH(D): O POS
Antibody Screen: NEGATIVE
Unit division: 0
Unit division: 0

## 2019-10-24 LAB — BPAM RBC
Blood Product Expiration Date: 202107122359
Blood Product Expiration Date: 202107132359
ISSUE DATE / TIME: 202106101043
ISSUE DATE / TIME: 202106101307
Unit Type and Rh: 5100
Unit Type and Rh: 5100

## 2019-10-25 ENCOUNTER — Inpatient Hospital Stay: Payer: Self-pay

## 2019-10-27 ENCOUNTER — Inpatient Hospital Stay: Payer: Self-pay

## 2019-10-27 ENCOUNTER — Inpatient Hospital Stay (HOSPITAL_BASED_OUTPATIENT_CLINIC_OR_DEPARTMENT_OTHER): Payer: Self-pay | Admitting: Nurse Practitioner

## 2019-10-27 ENCOUNTER — Encounter: Payer: Self-pay | Admitting: Nurse Practitioner

## 2019-10-27 ENCOUNTER — Other Ambulatory Visit: Payer: Self-pay

## 2019-10-27 VITALS — BP 98/64 | HR 95 | Temp 98.3°F | Resp 18

## 2019-10-27 VITALS — BP 99/71 | HR 98 | Temp 97.9°F | Resp 20 | Ht 68.0 in | Wt 103.0 lb

## 2019-10-27 DIAGNOSIS — C160 Malignant neoplasm of cardia: Secondary | ICD-10-CM

## 2019-10-27 LAB — CMP (CANCER CENTER ONLY)
ALT: 13 U/L (ref 0–44)
AST: 62 U/L — ABNORMAL HIGH (ref 15–41)
Albumin: 2.5 g/dL — ABNORMAL LOW (ref 3.5–5.0)
Alkaline Phosphatase: 231 U/L — ABNORMAL HIGH (ref 38–126)
Anion gap: 11 (ref 5–15)
BUN: 14 mg/dL (ref 8–23)
CO2: 27 mmol/L (ref 22–32)
Calcium: 9.1 mg/dL (ref 8.9–10.3)
Chloride: 95 mmol/L — ABNORMAL LOW (ref 98–111)
Creatinine: 0.54 mg/dL — ABNORMAL LOW (ref 0.61–1.24)
GFR, Est AFR Am: >60 mL/min (ref >60)
GFR, Estimated: >60 mL/min (ref >60)
Glucose, Bld: 124 mg/dL — ABNORMAL HIGH (ref 70–99)
Potassium: 4.3 mmol/L (ref 3.5–5.1)
Sodium: 133 mmol/L — ABNORMAL LOW (ref 135–145)
Total Bilirubin: 0.4 mg/dL (ref 0.3–1.2)
Total Protein: 6.7 g/dL (ref 6.5–8.1)

## 2019-10-27 LAB — CBC WITH DIFFERENTIAL (CANCER CENTER ONLY)
Abs Immature Granulocytes: 0.09 10*3/uL — ABNORMAL HIGH (ref 0.00–0.07)
Basophils Absolute: 0 10*3/uL (ref 0.0–0.1)
Basophils Relative: 0 %
Eosinophils Absolute: 0.1 10*3/uL (ref 0.0–0.5)
Eosinophils Relative: 1 %
HCT: 31.5 % — ABNORMAL LOW (ref 39.0–52.0)
Hemoglobin: 10.5 g/dL — ABNORMAL LOW (ref 13.0–17.0)
Immature Granulocytes: 1 %
Lymphocytes Relative: 8 %
Lymphs Abs: 1.2 10*3/uL (ref 0.7–4.0)
MCH: 29.5 pg (ref 26.0–34.0)
MCHC: 33.3 g/dL (ref 30.0–36.0)
MCV: 88.5 fL (ref 80.0–100.0)
Monocytes Absolute: 0.7 10*3/uL (ref 0.1–1.0)
Monocytes Relative: 5 %
Neutro Abs: 12.8 10*3/uL — ABNORMAL HIGH (ref 1.7–7.7)
Neutrophils Relative %: 85 %
Platelet Count: 549 10*3/uL — ABNORMAL HIGH (ref 150–400)
RBC: 3.56 MIL/uL — ABNORMAL LOW (ref 4.22–5.81)
RDW: 15.4 % (ref 11.5–15.5)
WBC Count: 14.9 10*3/uL — ABNORMAL HIGH (ref 4.0–10.5)
nRBC: 0 % (ref 0.0–0.2)

## 2019-10-27 LAB — SAMPLE TO BLOOD BANK

## 2019-10-27 MED ORDER — SODIUM CHLORIDE 0.9 % IV SOLN
Freq: Once | INTRAVENOUS | Status: AC
  Filled 2019-10-27: qty 250

## 2019-10-27 MED ORDER — ONDANSETRON HCL 4 MG/2ML IJ SOLN
8.0000 mg | Freq: Once | INTRAMUSCULAR | Status: AC
  Administered 2019-10-27: 8 mg via INTRAVENOUS

## 2019-10-27 MED ORDER — SODIUM CHLORIDE 0.9% FLUSH
10.0000 mL | Freq: Once | INTRAVENOUS | Status: AC
  Administered 2019-10-27: 10 mL
  Filled 2019-10-27: qty 10

## 2019-10-27 MED ORDER — HEPARIN SOD (PORK) LOCK FLUSH 100 UNIT/ML IV SOLN
500.0000 [IU] | Freq: Once | INTRAVENOUS | Status: AC
  Administered 2019-10-27: 500 [IU]
  Filled 2019-10-27: qty 5

## 2019-10-27 MED ORDER — ONDANSETRON HCL 4 MG/2ML IJ SOLN
INTRAMUSCULAR | Status: AC
  Filled 2019-10-27: qty 4

## 2019-10-27 MED ORDER — MORPHINE SULFATE (PF) 2 MG/ML IV SOLN
INTRAVENOUS | Status: AC
  Filled 2019-10-27: qty 1

## 2019-10-27 MED ORDER — SODIUM CHLORIDE 0.9 % IV SOLN
Freq: Once | INTRAVENOUS | Status: DC
  Filled 2019-10-27: qty 250

## 2019-10-27 MED ORDER — MORPHINE SULFATE (CONCENTRATE) 20 MG/ML PO SOLN: 10.0000 mg | ORAL | 0 refills | Status: AC | PRN

## 2019-10-27 MED ORDER — MORPHINE SULFATE (PF) 2 MG/ML IV SOLN
2.0000 mg | Freq: Once | INTRAVENOUS | Status: AC
  Administered 2019-10-27: 2 mg via INTRAVENOUS

## 2019-10-27 NOTE — Patient Instructions (Signed)
Deshidratacin en los adultos Dehydration, Adult La deshidratacin es una afeccin que se caracteriza por una cantidad insuficiente de agua u otros lquidos en el organismo. Esto sucede cuando una persona pierde ms lquidos de los que consume. Las partes importantes del cuerpo no pueden funcionar correctamente sin una cantidad [place] de lquidos. Cualquier prdida de lquidos del organismo puede causar deshidratacin. La deshidratacin puede ser leve, grave o muy grave. Debe tratarse de inmediato para evitar que sea muy grave. Cules son las causas? Esta afeccin puede ser causada por lo siguiente:  Afecciones que hacen que pierda agua u otros lquidos, por ejemplo: ? Materia fecal lquida (diarrea). ? Vmitos. ? Sudoracin abundante. ? Hacer mucho pis (orinar).  No beber suficientes lquidos, especialmente cuando: ? Usted est enfermo. ? Hace cosas que requieren Automatic Data.  Otras enfermedades y afecciones, como fiebre o infeccin.  Determinados medicamentos, como aquellos que eliminan el exceso de lquido del organismo (diurticos).  Falta de agua potable segura.  No poder obtener suficiente agua y alimentos. Qu incrementa el riesgo? Los siguientes factores pueden hacer que sea ms propenso a Armed forces training and education officer afeccin:  Tener una enfermedad prolongada (crnica) que no se ha tratado de Estate agent, como: ? Diabetes. ? Enfermedad cardaca. ? Enfermedad renal.  Ser mayor de 65aos de edad.  Tener una discapacidad.  Vivir en un lugar alto con respecto al suelo o al nivel del mar (de gran altitud). El aire menos denso y ms seco causa ms prdida de lquidos.  Hacer ejercicios que sobrecargan el cuerpo [employer]. Cules son los signos o sntomas? Los sntomas de deshidratacin dependen de su gravedad. Deshidratacin leve o grave  Sed.  Sequedad en los labios o la boca.  Sensacin de [employer] o desvanecimiento, especialmente al ponerse de pie  despus de estar sentado.  Calambres musculares.  Su cuerpo produce: ? Pis (orina) de color oscuro. Orina que puede tener el color del t. ? Bennie Hind menos que lo normal. ? Tiene menos lgrimas que lo normal.  Dolor de [place]. Deshidratacin muy grave  Cambios en la piel. La piel puede: ? Estar fra al tacto (pegajosa). ? Tener manchas o estar plida. ? No volver a la normalidad de inmediato despus de pellizcarla ligeramente y soltarla.  Escasa produccin o ausencia de lgrimas, orina o sudor.  Cambios en los signos vitales, por ejemplo: ? Respiracin acelerada. ? Presin arterial baja. ? Pulso dbil. ? Pulso que supera los 100latidos por minuto cuando est sentado y [place].  Otros cambios, por ejemplo: ? Catering manager sed. ? Ojos que se ven huecos (hundidos). ? Manos y pies fros. ? Estar desorientado (confundido). ? Estar muy cansado (aletargado) o tener problemas para despertarse. ? Prdida de peso a Control and instrumentation engineer. ? Prdida de la conciencia. Cmo se trata? El tratamiento de esta afeccin depende de su gravedad. El tratamiento debe comenzar de inmediato. No espere hasta que su afeccin sea muy grave. La deshidratacin muy grave es Engineer, maintenance (IT). Tendr que ir al hospital.  La deshidratacin leve o grave puede tratarse en la casa. Le pedirn que: ? Beba ms lquidos. ? Beba una solucin de rehidratacin oral (SRO). Esta bebida ayuda a reponer las cantidades correctas de lquidos y de sales y Optometrist en la sangre (electrolitos).  La deshidratacin muy grave puede tratarse: ? Con lquidos a travs de un tubo (catter) intravenoso. ? Restableciendo los [employer] de sales y Public librarian. A menudo, para esto, se administran sales y [employer] a travs de un  tubo. El tubo se introduce a travs de la [employer]el [employer]. ? Tratando el origen del problema. Siga estas instrucciones en su casa: Solucin de rehidratacin oral Si se lo indica el mdico, beba  una SRO:  Prepare una SRO. Use las instrucciones del envase.  Comience por beber pequeas cantidades, aproximadamente taza (168ml) cada 5 a 38minutos.  Bonnita Nasuti de a Firefighter a la cantidad indicada por el mdico. Comida y bebida         Beba suficiente lquido transparente como para mantener la orina de color amarillo plido. Si le indicaron que tome una SRO, termine primero la solucin. Luego, comience a beber lentamente otros lquidos transparentes. Beba lquidos, por ejemplo: ? [place]. No beba solamente agua. Esto puede [employer] niveles de sal (sodio) en el organismo bajen demasiado. ? Agua de trocitos de hielo que usted succiona. ? Jugo de frutas con agregado de agua (diluido). ? Bebidas deportivas de bajas caloras.  Consuma alimentos que contengan la cantidad [place] de sales y [place], por ejemplo: ? Bananas. ? Naranjas. ? Papas. ? Tomates. ? [place].  No beba alcohol.  Evite lo siguiente: ? Bebidas que contengan gran cantidad de azcar. Esto incluye lo siguiente:  Bebidas deportivas ricas en caloras.  Jugo de frutas sin agregado de agua.  Gaseosas.  Cafena. ? Alimentos grasos o que contienen mucha grasa o Location manager. Instrucciones generales  Use los medicamentos de venta libre y los recetados solamente como se lo haya indicado el mdico.  No tome comprimidos de sal. Esto puede hacer que los niveles de sal en el cuerpo suban demasiado.  Retome sus actividades normales segn lo indicado por el mdico. Pregntele al mdico qu actividades son seguras para usted.  Concurra a todas las visitas de seguimiento como se lo haya indicado el mdico. Esto es importante. Comunquese con un mdico si:  Tiene dolor de vientre (abdomen) y el dolor: ? Empeora. ? Permanece en un solo Environmental consultant.  Tiene una erupcin cutnea.  Presenta rigidez en el cuello.  Se siente enojado o molesto (irritable) con ms facilidad de lo normal.  Est ms cansado o le cuesta  despertarse ms de lo normal.  Presenta los siguientes sntomas: ? Debilidad o mareos. ? Mucha sed. Solicite ayuda inmediatamente si tiene:  Algn sntoma de deshidratacin muy grave.  Sntomas de vmitos, por ejemplo: ? No puede comer ni beber sin vomitar. ? Los vmitos empeoran o no desaparecen. ? Su vmito tiene sangre o una sustancia verde.  Sntomas que empeoran con [employer].  Cristy Hilts.  Dolor de cabeza muy intenso.  Problemas para orinar o para defecar (tener deposiciones), como: ? Heces acuosas que empeoran o que no desaparecen. ? Sangre en las heces (deposiciones). Esto puede hacer que la materia fecal sea negra y tenga aspecto alquitranado. ? No orinar durante 6 a 8 horas. ? Hacer una cantidad pequea de orina muy oscura en el trmino de 6 a 8horas.  Dificultad para respirar. Estos sntomas pueden Sales executive. No espere a ver si los sntomas desaparecen. Solicite atencin mdica de inmediato. Comunquese con el servicio de emergencias de su localidad (911 en los [place]). No conduzca por sus propios medios [employer] hospital. Resumen  La deshidratacin es una afeccin que se caracteriza por una cantidad insuficiente de agua u otros lquidos en el organismo. Esto sucede cuando una persona pierde ms lquidos de los que consume.  El tratamiento de esta afeccin depende de su gravedad. El tratamiento se debe comenzar  de inmediato. No espere hasta que su afeccin sea muy grave.  Beba suficiente lquido transparente como para mantener la orina de color amarillo plido. Si le indicaron que tome una solucin de rehidratacin oral (SRO), termine primero la solucin. Luego, comience a beber lentamente otros lquidos transparentes.  Use los medicamentos de venta libre y los recetados solamente como se lo haya indicado el mdico.  Obtenga ayuda de inmediato si tiene algn sntoma de deshidratacin muy grave. Esta informacin no tiene Marine scientist el  consejo del mdico. Asegrese de hacerle al mdico cualquier pregunta que tenga. Document Revised: 01/16/2019 Document Reviewed: 01/16/2019 Elsevier Patient Education  [place].

## 2019-10-28 ENCOUNTER — Inpatient Hospital Stay: Payer: Self-pay

## 2019-10-28 ENCOUNTER — Other Ambulatory Visit: Payer: Self-pay

## 2019-10-28 VITALS — BP 103/66 | HR 93 | Temp 98.1°F | Resp 16

## 2019-10-28 DIAGNOSIS — C160 Malignant neoplasm of cardia: Secondary | ICD-10-CM

## 2019-10-28 MED ORDER — SODIUM CHLORIDE 0.9% FLUSH
10.0000 mL | Freq: Once | INTRAVENOUS | Status: AC
  Administered 2019-10-28: 10 mL
  Filled 2019-10-28: qty 10

## 2019-10-28 MED ORDER — HEPARIN SOD (PORK) LOCK FLUSH 100 UNIT/ML IV SOLN
500.0000 [IU] | Freq: Once | INTRAVENOUS | Status: AC
  Administered 2019-10-28: 500 [IU]
  Filled 2019-10-28: qty 5

## 2019-10-28 MED ORDER — SODIUM CHLORIDE 0.9 % IV SOLN
Freq: Once | INTRAVENOUS | Status: AC
  Filled 2019-10-28: qty 250

## 2019-10-28 NOTE — Patient Instructions (Signed)
Rehidratacin en los adultos Rehydration, Adult La rehidratacin es la reposicin de los lquidos, sales y minerales del cuerpo (electrolitos) que se pierden durante la deshidratacin. La deshidratacin se da cuando hay una cantidad insuficiente de lquido o agua en el organismo. Esto ocurre cuando se pierden ms lquidos de los que se ingieren. Entre las causas frecuentes de deshidratacin, se incluyen:  Vmitos.  Diarrea.  Sudoracin excesiva, por ejemplo, por la exposicin al calor o por la actividad fsica.  Tomar medicamentos que hacen que el cuerpo pierda el exceso de lquido (diurticos).  Deterioro de la funcin renal.  No beber la cantidad suficiente de lquidos.  Ciertas enfermedades o infecciones.  Ciertas enfermedades prolongadas (crnicas) que no estn bien tratadas, como diabetes, enfermedades cardacas o renales.  Los sntomas de la deshidratacin leve pueden incluir sed, sequedad de los labios, de la boca y de la piel, y mareos. Los sntomas de la deshidratacin grave pueden incluir aumento de la frecuencia cardaca, confusin, desmayos e imposibilidad de orinar. Puede rehidratarse bebiendo ciertos lquidos o recibindolos a travs de un tubo (catter) intravenoso, segn las indicaciones del mdico. Cules son los riesgos? Por lo general, la rehidratacin es segura. Sin embargo, un problema que puede surgir es la ingesta excesiva de lquido (hiperhidratacin). Esto es raro. Si se produce la hiperhidratacin, esta puede causar un desequilibrio de los electrolitos, insuficiencia renal o una disminucin de la concentracin de sal (sodio) en el cuerpo. Cmo rehidratarse Siga las indicaciones del mdico con respecto a la rehidratacin. El tipo y la cantidad de lquido que debe beber dependen de su afeccin.  Si el mdico se lo indic, beba una solucin de rehidratacin oral (SRO). Se trata de una bebida diseada para tratar la deshidratacin que puede encontrar en farmacias y  tiendas. ? Para preparar una SRO, siga las indicaciones del envase. ? Comience por beber pequeas cantidades, aproximadamente taza (120ml) cada 5 a 10minutos. ? Aumente lentamente la cantidad que bebe hasta que haya ingerido la cantidad recomendada por el mdico.  Beba suficientes lquidos transparentes para mantener la orina de color claro o amarillo plido. Si le indicaron que beba una SRO, termine la SRO primero y, luego, comience a beber de a poco otros lquidos transparentes. Beba lquidos, por ejemplo: ? Agua. No beba solo agua. Si bebe solo agua, puede disminuir mucho la cantidad de sodio en el cuerpo (hiponatremia). ? Cubitos de hielo. ? Jugo de frutas con agregado de agua (jugo diluido). ? Bebidas deportivas de bajas caloras.  Si est muy deshidratado, el mdico puede recomendarle que reciba lquidos a travs de un tubo (catter) intravenoso en el hospital.  No tome comprimidos de sodio. Esto puede causar la acumulacin excesiva de sodio en el organismo (hipernatremia). Alimentacin durante la rehidratacin Siga las indicaciones del mdico respecto de lo que puede comer mientras se rehidrata. El mdico puede recomendarle que comience a comer despacio alimentos habituales en pequeas cantidades.  Consuma los alimentos que contienen un equilibrio saludable de electrolitos, como las bananas, las naranjas, las papas, los tomates y la espinaca.  Evite los alimentos muy grasos o azucarados.  En algunos casos, puede alimentarse a travs de una sonda de alimentacin que se coloca a travs de la nariz hasta el estmago (sonda nasogstrica o sonda NG). Esto puede hacerse si tiene vmitos o diarrea que no pueden controlarse. Bebidas que se deben evitar Ciertas bebidas pueden empeorar la deshidratacin. Mientras se rehidrata, evite lo siguiente:  Alcohol.  Cafena.  Bebidas que contengan gran cantidad de azcar. Estas   incluyen las siguientes: ? Bebidas deportivas altas en  caloras. ? Jugo de frutas sin diluir. ? Gaseosas.  Verifique las etiquetas de los productos para saber cunta azcar o cafena contienen las bebidas. Signos de recuperacin de la deshidratacin Se puede estar recuperando de la deshidratacin si:  Orina con ms frecuencia que antes de comenzar la rehidratacin.  La orina es clara o de color amarillo plido.  Mejora el nivel de energa.  Vomita con menos frecuencia.  Tiene diarrea con menos frecuencia.  El apetito mejora o vuelve a la normalidad.  Se siente menos mareado o disminuye la sensacin de desvanecimiento.  El color y el tono de la piel comienzan a lucir ms normales. Comunquese con un mdico si:  Contina teniendo sntomas de deshidratacin leve, por ejemplo: ? Sed. ? Labios secos. ? Sequedad leve en la boca. ? Piel seca y caliente. ? Mareos.  Sigue con vmitos o diarrea. Solicite ayuda de inmediato si:  Tiene sntomas de deshidratacin que empeoran.  Presenta los siguientes sntomas: ? Confusin. ? Debilidad. ? Sensacin como si se fuera a desmayar.  No ha orinado en el trmino de 6 a 8horas.  La orina es muy oscura.  Tiene dificultad para respirar.  La frecuencia cardaca mientras est sentado quieto supera los 100latidos por minuto.  No puede beber lquidos sin vomitar.  Tiene vmitos o diarrea que: ? Empeoran. ? Persisten.  Tiene fiebre. Esta informacin no tiene como fin reemplazar el consejo del mdico. Asegrese de hacerle al mdico cualquier pregunta que tenga. Document Revised: 09/13/2016 Document Reviewed: 06/25/2015 Elsevier Patient Education  2020 Elsevier Inc.  

## 2019-10-28 NOTE — Progress Notes (Addendum)
[place]   Telephone:([phone]) [phone] Fax:(336) [phone]   Clinic Follow up Note   Patient Care Team: Patient, No Pcp Per as PCP - General (General Practice) [name], RN as Oncology Nurse Navigator 10/29/2019  CHIEF COMPLAINT: F/u gastric cancer   CURRENT THERAPY: FOLFOX plus Nivolumab, s/p cycle 2 on 09/25/19; now supportive care   INTERVAL HISTORY: Mr. Rusnak returns for f/u and supportive care as scheduled. He received IVF on 6/14 and 6/15. He feels "lifted," last couple days have been good for him. He has rested outside. Drinking milk. Gained 3 lbs. He is constipated, no BM in 4 days. Has not taken medication. Denies signs of bleeding. Abdominal and low back pain are stable and "not harsh." Takes morphine q6 usually. BG's ranging 130-170's lately, takes 5 units insulin. He has no signs of COVID19 infection, he had covid test this morning in order to fly home on Friday. His flight is booked, grandson will travel with him.    MEDICAL HISTORY:  Past Medical History:  Diagnosis Date  . Cancer ([place])    pancreas  . Diabetes mellitus without complication ([place])     SURGICAL HISTORY: Past Surgical History:  Procedure Laterality Date  . ESOPHAGOGASTRODUODENOSCOPY (EGD) WITH PROPOFOL N/A 10/18/2019   Procedure: ESOPHAGOGASTRODUODENOSCOPY (EGD) WITH PROPOFOL;  Surgeon: Mauri Pole, MD;  Location: [facility];  Service: Endoscopy;  Laterality: N/A;  . IR IMAGING GUIDED PORT INSERTION  09/03/2019    I have reviewed the social history and family history with the patient and they are unchanged from previous note.  ALLERGIES:  has No Known Allergies.  MEDICATIONS:  Current Outpatient Medications  Medication Sig Dispense Refill  . insulin NPH Human (NOVOLIN N) 100 UNIT/ML injection Inject 5-10 Units into the skin daily as needed (5 units if BS over 150/10 units if BS over 200).    Marland Kitchen lidocaine-prilocaine (EMLA) cream Apply 1 application topically as  directed. Apply to port site 1 hour prior to stick and cover with plastic wrap. Start with 2nd chemo treatment 30 g 0  . magic mouthwash SOLN Take 5 mLs by mouth 4 (four) times daily as needed for mouth pain (swish and spit). 240 mL 0  . morphine (ROXANOL) 20 MG/ML concentrated solution Take 0.5-1 mLs (10-20 mg total) by mouth every 4 (four) hours as needed for severe pain. 240 mL 0  . ondansetron (ZOFRAN) 4 MG tablet Take 1 tablet (4 mg total) by mouth every 6 (six) hours as needed for nausea. 20 tablet 0  . pantoprazole (PROTONIX) 40 MG tablet Take 1 tablet (40 mg total) by mouth 2 (two) times daily. 60 tablet 1  . predniSONE (DELTASONE) 10 MG tablet Take 1 tablet (10 mg total) by mouth daily with breakfast. 20 tablet 0  . prochlorperazine (COMPAZINE) 10 MG tablet Take 1 tablet (10 mg total) by mouth every 6 (six) hours as needed for nausea. 45 tablet 1  . sucralfate (CARAFATE) 1 g tablet Take 1 tablet (1 g total) by mouth 4 (four) times daily -  with meals and at bedtime. 120 tablet 1   Current Facility-Administered Medications  Medication Dose Route Frequency Provider Last Rate Last Admin  . 0.9 %  sodium chloride infusion   Intravenous Once Alla Feeling, NP 500 mL/hr at 10/29/19 1129 New Bag at 10/29/19 1129   Facility-Administered Medications Ordered in Other Visits  Medication Dose Route Frequency Provider Last Rate Last Admin  . 0.9 %  sodium chloride infusion (Manually  program via Guardrails IV Fluids)  250 mL Intravenous Once Alla Feeling, NP        PHYSICAL EXAMINATION:  Vitals:   10/29/19 1022 10/29/19 1023  BP: 92/62 99/63  Pulse: (!) 105   Resp: 19   Temp: 97.9 F (36.6 C)   SpO2: 100%    Filed Weights   10/29/19 1022  Weight: 106 lb 14.4 oz (48.5 kg)    GENERAL: awake, cachetic, no distress and comfortable SKIN: dry, no rash  EYES:  sclera clear LUNGS:  normal breathing effort HEART: no lower extremity edema ABDOMEN: deferred Musculoskeletal:no cyanosis of  digits and no clubbing  NEURO: alert & oriented x 3 with fluent speech, generally weak in wheelchair PAC without erythema   LABORATORY DATA:  I have reviewed the data as listed CBC Latest Ref Rng & Units 10/29/2019 10/27/2019 10/23/2019  WBC 4.0 - 10.5 K/uL 13.1(H) 14.9(H) 17.9(H)  Hemoglobin 13.0 - 17.0 g/dL 9.7(L) 10.5(L) 7.8(L)  Hematocrit 39 - 52 % 29.0(L) 31.5(L) 24.7(L)  Platelets 150 - 400 K/uL 519(H) 549(H) 479(H)     CMP Latest Ref Rng & Units 10/27/2019 10/23/2019 10/19/2019  Glucose 70 - 99 mg/dL 124(H) 169(H) 134(H)  BUN 8 - 23 mg/dL _0 Creatinine 0.61 - 1.24 mg/dL 0.54(L) 0.52(L) 0.50(L)  Sodium 135 - 145 mmol/L 133(L) 129(L) 133(L)  Potassium 3.5 - 5.1 mmol/L 4.3 4.6 4.0  Chloride 98 - 111 mmol/L 95(L) 94(L) 98  CO2 22 - 32 mmol/L _1 Calcium 8.9 - 10.3 mg/dL 9.1 8.6(L) 8.7(L)  Total Protein 6.5 - 8.1 g/dL 6.7 - -  Total Bilirubin 0.3 - 1.2 mg/dL 0.4 - -  Alkaline Phos 38 - 126 U/L 231(H) - -  AST 15 - 41 U/L 62(H) - -  ALT 0 - 44 U/L 13 - -      RADIOGRAPHIC STUDIES: I have personally reviewed the radiological images as listed and agreed with the findings in the report. No results found.   ASSESSMENT & PLAN:  1. Metastatic gastric cancer  EGD 08/15/2019-large ulcerated lesion in the gastric cardia immediately below the GE junction, malignant appearing. Biopsy-adenocarcinoma, poorly differentiated; HER-2 negative, PD1 combined positive score 3  CT abdomen/pelvis August 25, 2019-large centrally necrotic mass left upper quadrant of the abdomen measuring 12.2 x 9.0 x 8.6 cm, thick irregular enhancing walls, central low density and multiple central air bubbles; the mass is located between the liver, stomach and pancreas and appears inseparable from the lesser curvature of the stomach. Multiple liver lesions throughout all segments. Metastasis within the caudate lobe of the liver exerts mild mass-effect on the IVC which remains patent. Multiple small lymph  nodes in the gastrohepatic ligament and retroperitoneum, not pathologically enlarged.  Cycle 1 FOLFOX 09/04/2019  Cycle 2 FOLFOX plus nivolumab 09/24/2019, Udenyca added, 5-FU dose reduced  EGD 10/18/2019-large gastric cardia/body mass with oozing  ED visit 10/23/19, RBC 2 units for symptomatic anemia  2. Abdominal pain secondary to #1  3. Dysphagia/odynophagia secondary to #1  4. Anorexia/weight loss secondary to #1  5. Diabetes 6. Mucositis secondary to chemotherapy. Improved 09/15/2019. 7. Admission 09/16/2019 with febrile neutropenia, cough-right lower lobe consolidation on CT  Disposition:  Mr. Blaustein appears unchanged. His pain is stable, controlled on liquid morphine q6 PRN. Energy has improved slightly with recent blood transfusion and IVF this week. CBC shows Hgb 9.7, no evidence of active bleeding.   He has a flight booked to [place] on 10/31/19, his grandson will  travel with him. He has a doctor who will care for him back home. His daughter will remain here and knows to call us if there are any needs we can help manage. They are appreciative of his care. Precautionary covid test for travel is pending from today, he currently has no signs of covid19 infection.   We reviewed symptom management for constipation, nausea, and pain. We discussed his medication list, can stop insulin due to poor po intake. He can stop prednisone if he does not feel it is beneficial to him.   He will receive supportive care with 1 L NS over 2 hours and 1 uRBCs today in preparation for his upcoming travel. Will not schedule future appointments at this time. The patient was seen with Dr. Benay Spice today.    Orders Placed This Encounter  Procedures  . Care order/instruction    Transfuse Parameters    Standing Status:   Future    Standing Expiration Date:   10/28/2020  . Informed Consent Details: Physician/Practitioner Attestation; Transcribe to consent form and obtain patient signature    Standing Status:    Future    Standing Expiration Date:   10/28/2020    Order Specific Question:   Physician/Practitioner attestation of informed consent for blood and or blood product transfusion    Answer:   I, the physician/practitioner, attest that I have discussed with the patient the benefits, risks, side effects, alternatives, likelihood of achieving goals and potential problems during recovery for the procedure that I have provided informed consent.    Order Specific Question:   Product(s)    Answer:   All Product(s)  . Type and screen    Standing Status:   Future    Number of Occurrences:   1    Standing Expiration Date:   10/28/2020   All questions were answered. The patient knows to call the clinic with any problems, questions or concerns. No barriers to learning were detected.     Alla Feeling, NP 10/29/19  This was a shared visit with Cira Rue.  Mr. Pouliot reports feeling better after receiving IV fluids.  The hemoglobin is slightly lower today, likely a reflection of hydration.  He will receive additional IV fluids today and 1 unit of packed red cells.  He will be leaving for the [place] later this week.  Mr. Pease and his family know to contact us as needed.  He was seen today with the aid of a Spanish interpreter.  Julieanne Manson, MD

## 2019-10-29 ENCOUNTER — Inpatient Hospital Stay: Payer: Self-pay

## 2019-10-29 ENCOUNTER — Encounter: Payer: Self-pay | Admitting: Nurse Practitioner

## 2019-10-29 ENCOUNTER — Other Ambulatory Visit: Payer: Self-pay

## 2019-10-29 ENCOUNTER — Inpatient Hospital Stay (HOSPITAL_BASED_OUTPATIENT_CLINIC_OR_DEPARTMENT_OTHER): Payer: Self-pay | Admitting: Nurse Practitioner

## 2019-10-29 ENCOUNTER — Ambulatory Visit: Payer: HRSA Program | Attending: Internal Medicine

## 2019-10-29 ENCOUNTER — Other Ambulatory Visit: Payer: Self-pay | Admitting: *Deleted

## 2019-10-29 VITALS — BP 99/63 | HR 105 | Temp 97.9°F | Resp 19 | Ht 68.0 in | Wt 106.9 lb

## 2019-10-29 VITALS — BP 117/61 | HR 87 | Temp 98.6°F | Resp 18

## 2019-10-29 DIAGNOSIS — C160 Malignant neoplasm of cardia: Secondary | ICD-10-CM

## 2019-10-29 DIAGNOSIS — Z20822 Contact with and (suspected) exposure to covid-19: Secondary | ICD-10-CM | POA: Insufficient documentation

## 2019-10-29 LAB — CBC WITH DIFFERENTIAL (CANCER CENTER ONLY)
Abs Immature Granulocytes: 0.07 10*3/uL (ref 0.00–0.07)
Basophils Absolute: 0 10*3/uL (ref 0.0–0.1)
Basophils Relative: 0 %
Eosinophils Absolute: 0.1 10*3/uL (ref 0.0–0.5)
Eosinophils Relative: 1 %
HCT: 29 % — ABNORMAL LOW (ref 39.0–52.0)
Hemoglobin: 9.7 g/dL — ABNORMAL LOW (ref 13.0–17.0)
Immature Granulocytes: 1 %
Lymphocytes Relative: 9 %
Lymphs Abs: 1.1 10*3/uL (ref 0.7–4.0)
MCH: 29.5 pg (ref 26.0–34.0)
MCHC: 33.4 g/dL (ref 30.0–36.0)
MCV: 88.1 fL (ref 80.0–100.0)
Monocytes Absolute: 0.6 10*3/uL (ref 0.1–1.0)
Monocytes Relative: 5 %
Neutro Abs: 11.3 10*3/uL — ABNORMAL HIGH (ref 1.7–7.7)
Neutrophils Relative %: 84 %
Platelet Count: 519 10*3/uL — ABNORMAL HIGH (ref 150–400)
RBC: 3.29 MIL/uL — ABNORMAL LOW (ref 4.22–5.81)
RDW: 15.1 % (ref 11.5–15.5)
WBC Count: 13.1 10*3/uL — ABNORMAL HIGH (ref 4.0–10.5)
nRBC: 0 % (ref 0.0–0.2)

## 2019-10-29 LAB — PREPARE RBC (CROSSMATCH)

## 2019-10-29 LAB — ABO/RH: ABO/RH(D): O POS

## 2019-10-29 MED ORDER — HEPARIN SOD (PORK) LOCK FLUSH 100 UNIT/ML IV SOLN
500.0000 [IU] | Freq: Once | INTRAVENOUS | Status: AC
  Administered 2019-10-29: 500 [IU]
  Filled 2019-10-29: qty 5

## 2019-10-29 MED ORDER — SODIUM CHLORIDE 0.9% FLUSH
10.0000 mL | Freq: Once | INTRAVENOUS | Status: AC
  Administered 2019-10-29: 10 mL
  Filled 2019-10-29: qty 10

## 2019-10-29 MED ORDER — SODIUM CHLORIDE 0.9 % IV SOLN
Freq: Once | INTRAVENOUS | Status: AC
  Filled 2019-10-29: qty 250

## 2019-10-29 MED ORDER — SODIUM CHLORIDE 0.9% IV SOLUTION
250.0000 mL | Freq: Once | INTRAVENOUS | Status: DC
  Filled 2019-10-29: qty 250

## 2019-10-29 NOTE — Patient Instructions (Signed)
Dehydration, Adult Dehydration is condition in which there is not enough water or other fluids in the body. This happens when a person loses more fluids than he or she takes in. Important body parts cannot work right without the right amount of fluids. Any loss of fluids from the body can cause dehydration. Dehydration can be mild, worse, or very bad. It should be treated right away to keep it from getting very bad. What are the causes? This condition may be caused by:  Conditions that cause loss of water or other fluids, such as: ? Watery poop (diarrhea). ? Vomiting. ? Sweating a lot. ? Peeing (urinating) a lot.  Not drinking enough fluids, especially when you: ? Are ill. ? Are doing things that take a lot of energy to do.  Other illnesses and conditions, such as fever or infection.  Certain medicines, such as medicines that take extra fluid out of the body (diuretics).  Lack of safe drinking water.  Not being able to get enough water and food. What increases the risk? The following factors may make you more likely to develop this condition:  Having a long-term (chronic) illness that has not been treated the right way, such as: ? Diabetes. ? Heart disease. ? Kidney disease.  Being 65 years of age or older.  Having a disability.  Living in a place that is high above the ground or sea (high in altitude). The thinner, dried air causes more fluid loss.  Doing exercises that put stress on your body for a long time. What are the signs or symptoms? Symptoms of dehydration depend on how bad it is. Mild or worse dehydration  Thirst.  Dry lips or dry mouth.  Feeling dizzy or light-headed, especially when you stand up from sitting.  Muscle cramps.  Your body making: ? Dark pee (urine). Pee may be the color of tea. ? Less pee than normal. ? Less tears than normal.  Headache. Very bad dehydration  Changes in skin. Skin may: ? Be cold to the touch (clammy). ? Be blotchy  or pale. ? Not go back to normal right after you lightly pinch it and let it go.  Little or no tears, pee, or sweat.  Changes in vital signs, such as: ? Fast breathing. ? Low blood pressure. ? Weak pulse. ? Pulse that is more than 100 beats a minute when you are sitting still.  Other changes, such as: ? Feeling very thirsty. ? Eyes that look hollow (sunken). ? Cold hands and feet. ? Being mixed up (confused). ? Being very tired (lethargic) or having trouble waking from sleep. ? Short-term weight loss. ? Loss of consciousness. How is this treated? Treatment for this condition depends on how bad it is. Treatment should start right away. Do not wait until your condition gets very bad. Very bad dehydration is an emergency. You will need to go to a hospital.  Mild or worse dehydration can be treated at home. You may be asked to: ? Drink more fluids. ? Drink an oral rehydration solution (ORS). This drink helps get the right amounts of fluids and salts and minerals in the blood (electrolytes).  Very bad dehydration can be treated: ? With fluids through an IV tube. ? By getting normal levels of salts and minerals in your blood. This is often done by giving salts and minerals through a tube. The tube is passed through your nose and into your stomach. ? By treating the root cause. Follow these instructions at   home: Oral rehydration solution If told by your doctor, drink an ORS:  Make an ORS. Use instructions on the package.  Start by drinking small amounts, about  cup (120 mL) every 5-10 minutes.  Slowly drink more until you have had the amount that your doctor said to have. Eating and drinking         Drink enough clear fluid to keep your pee pale yellow. If you were told to drink an ORS, finish the ORS first. Then, start slowly drinking other clear fluids. Drink fluids such as: ? Water. Do not drink only water. Doing that can make the salt (sodium) level in your body get too  low. ? Water from ice chips you suck on. ? Fruit juice that you have added water to (diluted). ? Low-calorie sports drinks.  Eat foods that have the right amounts of salts and minerals, such as: ? Bananas. ? Oranges. ? Potatoes. ? Tomatoes. ? Spinach.  Do not drink alcohol.  Avoid: ? Drinks that have a lot of sugar. These include:  High-calorie sports drinks.  Fruit juice that you did not add water to.  Soda.  Caffeine. ? Foods that are greasy or have a lot of fat or sugar. General instructions  Take over-the-counter and prescription medicines only as told by your doctor.  Do not take salt tablets. Doing that can make the salt level in your body get too high.  Return to your normal activities as told by your doctor. Ask your doctor what activities are safe for you.  Keep all follow-up visits as told by your doctor. This is important. Contact a doctor if:  You have pain in your belly (abdomen) and the pain: ? Gets worse. ? Stays in one place.  You have a rash.  You have a stiff neck.  You get angry or annoyed (irritable) more easily than normal.  You are more tired or have a harder time waking than normal.  You feel: ? Weak or dizzy. ? Very thirsty. Get help right away if you have:  Any symptoms of very bad dehydration.  Symptoms of vomiting, such as: ? You cannot eat or drink without vomiting. ? Your vomiting gets worse or does not go away. ? Your vomit has blood or green stuff in it.  Symptoms that get worse with treatment.  A fever.  A very bad headache.  Problems with peeing or pooping (having a bowel movement), such as: ? Watery poop that gets worse or does not go away. ? Blood in your poop (stool). This may cause poop to look black and tarry. ? Not peeing in 6-8 hours. ? Peeing only a small amount of very dark pee in 6-8 hours.  Trouble breathing. These symptoms may be an emergency. Do not wait to see if the symptoms will go away. Get  medical help right away. Call your local emergency services (911 in the [place] Do not drive yourself to the hospital. Summary  Dehydration is a condition in which there is not enough water or other fluids in the body. This happens when a person loses more fluids than he or she takes in.  Treatment for this condition depends on how bad it is. Treatment should be started right away. Do not wait until your condition gets very bad.  Drink enough clear fluid to keep your pee pale yellow. If you were told to drink an oral rehydration solution (ORS), finish the ORS first. Then, start slowly drinking other clear fluids.  Take over-the-counter and prescription medicines only as told by your doctor.  Get help right away if you have any symptoms of very bad dehydration. This information is not intended to replace advice given to you by your health care provider. Make sure you discuss any questions you have with your health care provider. Document Revised: 12/12/2018 Document Reviewed: 12/12/2018 Elsevier Patient Education  [place]. Blood Transfusion, Adult, Care After This sheet gives you information about how to care for yourself after your procedure. Your doctor may also give you more specific instructions. If you have problems or questions, contact your doctor. What can I expect after the procedure? After the procedure, it is common to have:  Bruising and soreness at the IV site.  A fever or chills on the day of the procedure. This may be your body's response to the new blood cells received.  A headache. Follow these instructions at home: Insertion site care      Follow instructions from your doctor about how to take care of your insertion site. This is where an IV tube was put into your vein. Make sure you: ? Wash your hands with soap and water before and after you change your bandage (dressing). If you cannot use soap and water, use hand sanitizer. ? Change your bandage as told  by your doctor.  Check your insertion site every day for signs of infection. Check for: ? Redness, swelling, or pain. ? Bleeding from the site. ? Warmth. ? Pus or a bad smell. General instructions  Take over-the-counter and prescription medicines only as told by your doctor.  Rest as told by your doctor.  Go back to your normal activities as told by your doctor.  Keep all follow-up visits as told by your doctor. This is important. Contact a doctor if:  You have itching or red, swollen areas of skin (hives).  You feel worried or nervous (anxious).  You feel weak after doing your normal activities.  You have redness, swelling, warmth, or pain around the insertion site.  You have blood coming from the insertion site, and the blood does not stop with pressure.  You have pus or a bad smell coming from the insertion site. Get help right away if:  You have signs of a serious reaction. This may be coming from an allergy or the body's defense system (immune system). Signs include: ? Trouble breathing or shortness of breath. ? Swelling of the face or feeling warm (flushed). ? Fever or chills. ? Head, chest, or back pain. ? Dark pee (urine) or blood in the pee. ? Widespread rash. ? Fast heartbeat. ? Feeling dizzy or light-headed. You may receive your blood transfusion in an outpatient setting. If so, you will be told whom to contact to report any reactions. These symptoms may be an emergency. Do not wait to see if the symptoms will go away. Get medical help right away. Call your local emergency services (911 in the [place] Do not drive yourself to the hospital. Summary  Bruising and soreness at the IV site are common.  Check your insertion site every day for signs of infection.  Rest as told by your doctor. Go back to your normal activities as told by your doctor.  Get help right away if you have signs of a serious reaction. This information is not intended to replace advice  given to you by your health care provider. Make sure you discuss any questions you have with your health care provider. Document  Revised: 10/24/2018 Document Reviewed: 10/24/2018 Elsevier Patient Education  [employer].

## 2019-10-29 NOTE — Progress Notes (Signed)
Patient had a pending COVID test. The chart stated he was symptomatic and had onset on 6/11. ADON had him sent to doctor side for CBC. She believed the information may have been imputed wrong at facility.

## 2019-10-30 ENCOUNTER — Telehealth: Payer: Self-pay | Admitting: *Deleted

## 2019-10-30 LAB — TYPE AND SCREEN
ABO/RH(D): O POS
Antibody Screen: NEGATIVE
Unit division: 0

## 2019-10-30 LAB — BPAM RBC
Blood Product Expiration Date: 202107172359
ISSUE DATE / TIME: 202106161341
Unit Type and Rh: 5100

## 2019-10-30 LAB — SARS-COV-2, NAA 2 DAY TAT

## 2019-10-30 LAB — NOVEL CORONAVIRUS, NAA: SARS-CoV-2, NAA: NOT DETECTED

## 2019-10-30 MED ORDER — PROCHLORPERAZINE MALEATE 10 MG PO TABS: 10.0000 mg | ORAL_TABLET | Freq: Four times a day (QID) | ORAL | 1 refills | Status: AC | PRN

## 2019-10-30 MED FILL — PROCHLORPERAZINE 10 MG TAB: 10 | 15 days supply | Qty: 60 | Fill #0

## 2019-10-30 MED FILL — MORPHINE SULFATE (CONCENTRA: 20 | 35 days supply | Qty: 210 | Fill #0

## 2019-10-30 NOTE — Telephone Encounter (Signed)
Claims that [facility] did not get the refill on his morphine that was ordered on 6/14 and he also needs his compazine refilled. Leaving the country tomorrow. Call [facility] and they will fill both now-pharmacy closes at 6 pm Informed daughter-in-law and they will leave to pick up meds today.

## 2019-11-01 ENCOUNTER — Other Ambulatory Visit: Payer: Self-pay | Admitting: Oncology

## 2019-11-06 ENCOUNTER — Ambulatory Visit: Payer: Self-pay

## 2021-12-16 IMAGING — CT CT ABD-PELV W/ CM
2 of 10 series · 11 of 46 positions shown, 16 images · IV contrast (omnipaque)
Comparison: None.

CLINICAL DATA: Abdominal distension and pain. Recently diagnosed
with pancreatic cancer in [place]. No reported therapy. History of
diabetes.

EXAM:
CT ABDOMEN AND PELVIS WITH CONTRAST
TECHNIQUE: Multidetector CT imaging of the abdomen and pelvis was performed
using the standard protocol following bolus administration of
intravenous contrast.
CONTRAST:  100mL OMNIPAQUE IOHEXOL 300 MG/ML  SOLN

[Series 7: axial venous · axial · portal-venous · 0.66mm/px · z∈[-538,-208]mm · 8 of 142 slices shown, 13 images]
[im 16/142  soft-tissue]
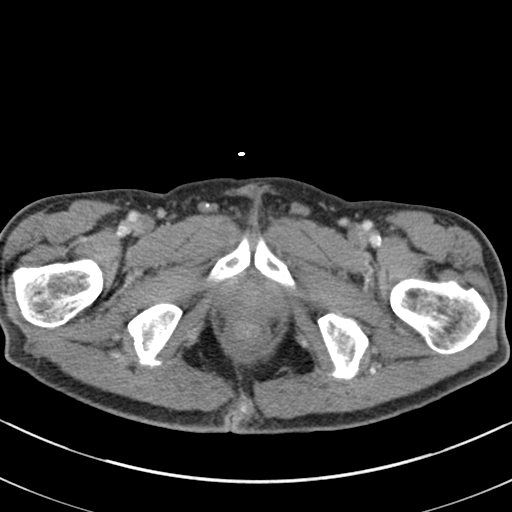
[im 16/142  bone]
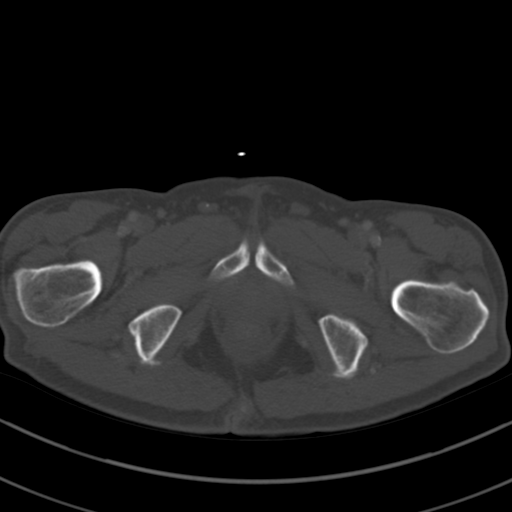
[im 32/142  soft-tissue]
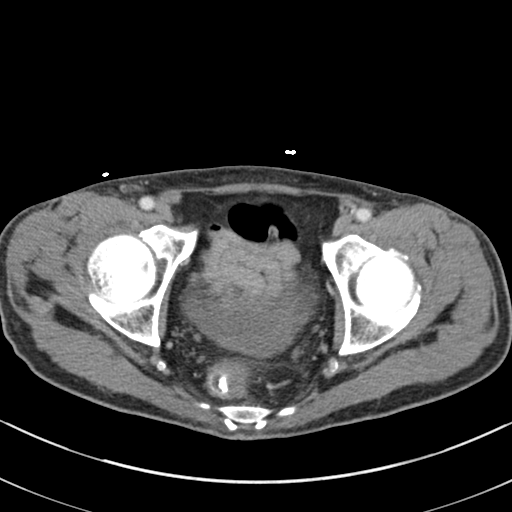
[im 48/142  soft-tissue]
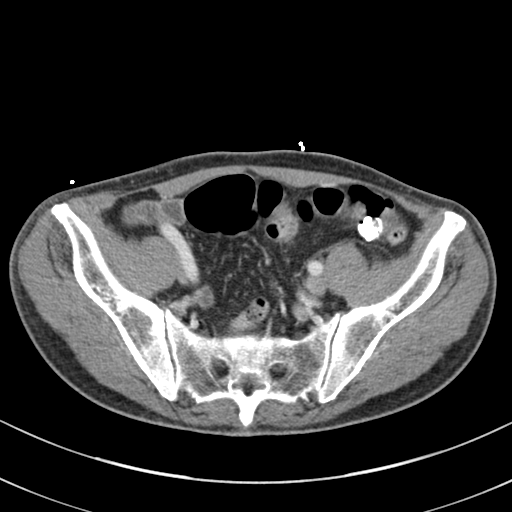
[im 63/142  soft-tissue]
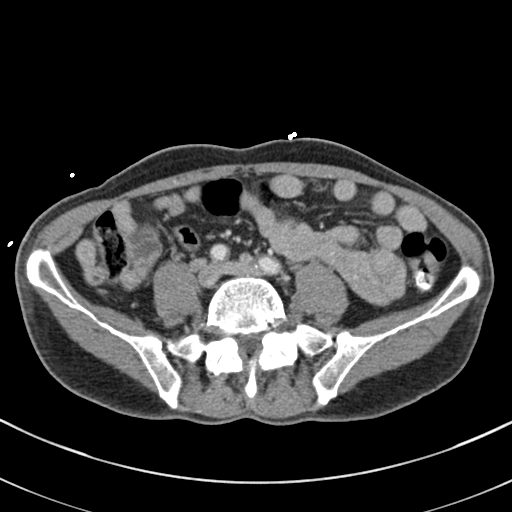
[im 79/142  soft-tissue]
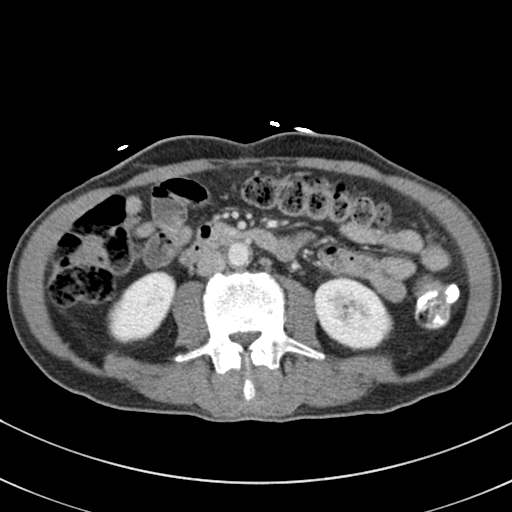
[im 79/142  lung]
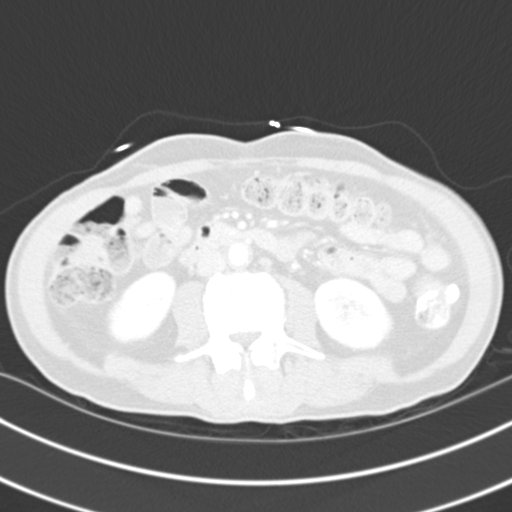
[im 95/142  soft-tissue]
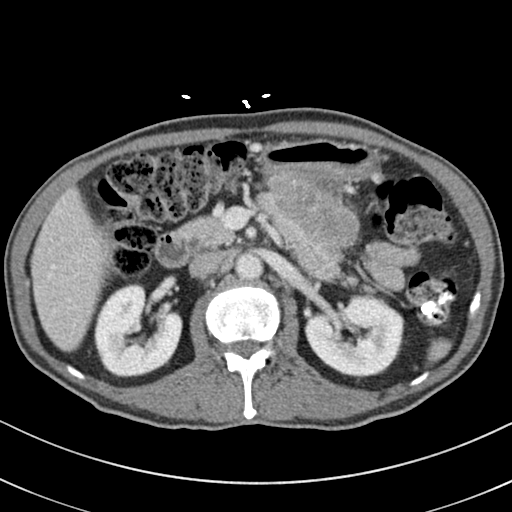
[im 95/142  lung]
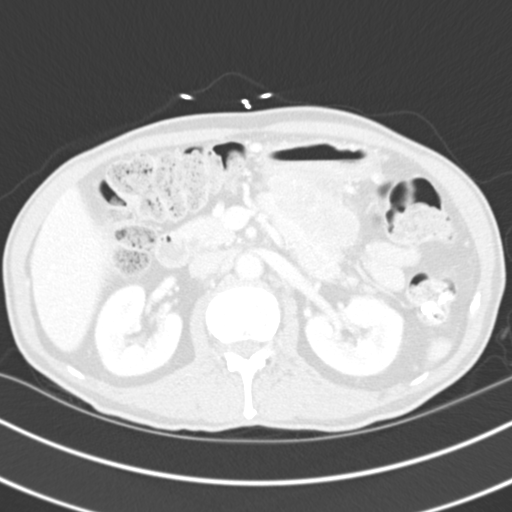
[im 110/142  soft-tissue]
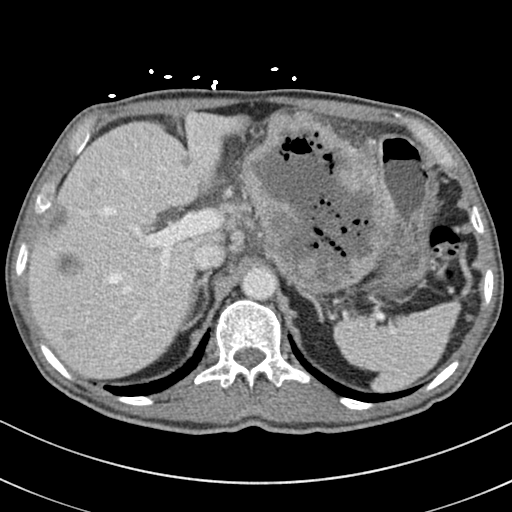
[im 110/142  lung]
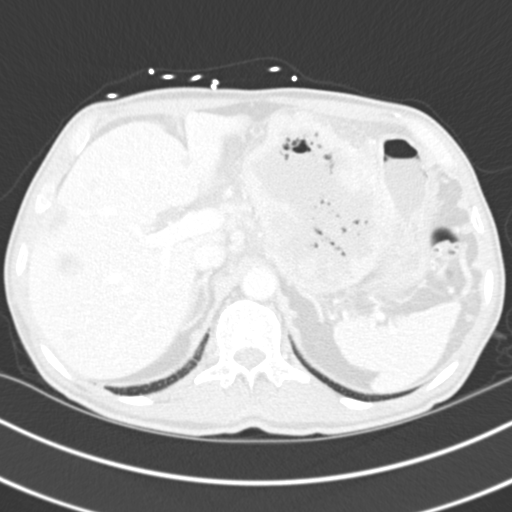
[im 126/142  soft-tissue]
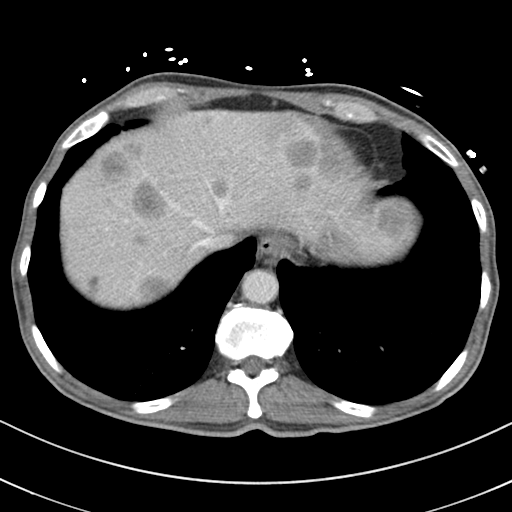
[im 126/142  lung]
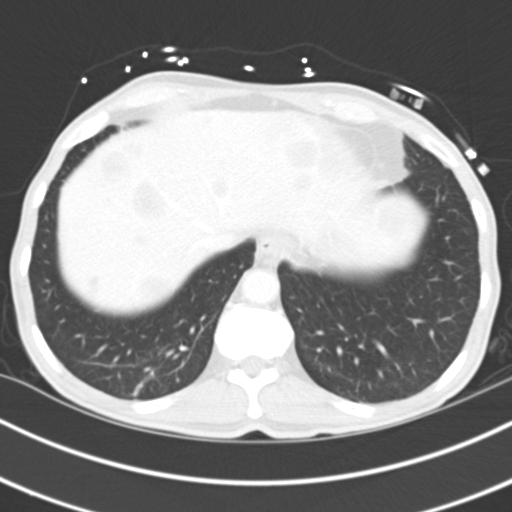

[Series 10: coronal venous · coronal · portal-venous · 0.69mm/px · 3 of 82 slices shown]
[im 21/82  soft-tissue]
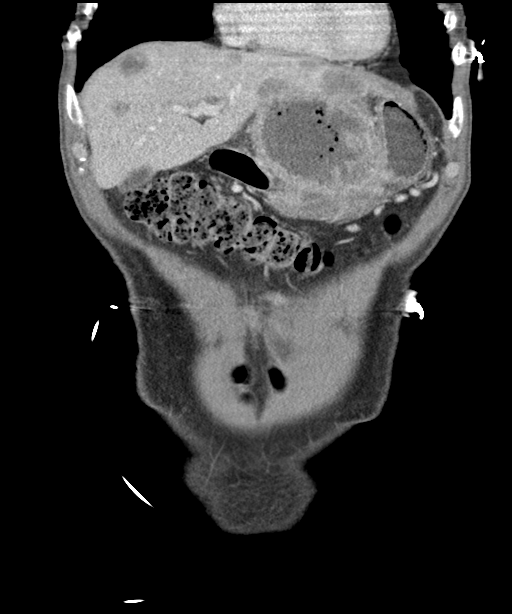
[im 41/82  soft-tissue]
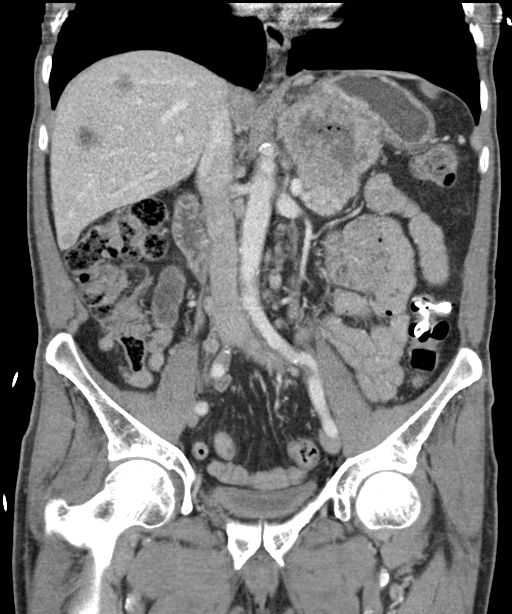
[im 61/82  soft-tissue]
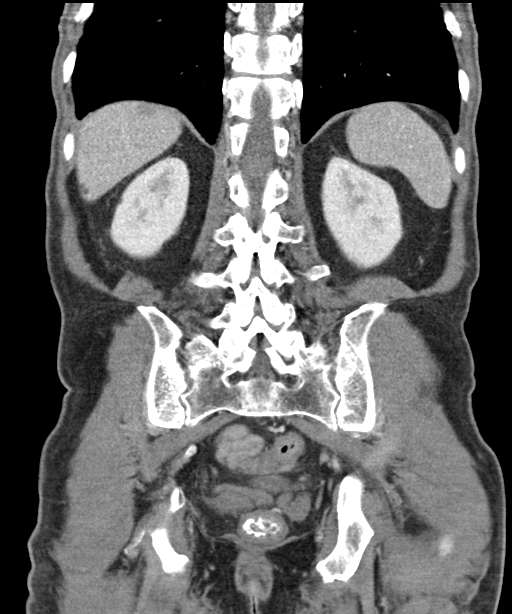

[11 of 46 positions shown; findings below may reference images not displayed]

FINDINGS: Lower chest: Mild linear atelectasis or scarring at both lung bases.
There is a calcified granuloma in the lingula. No significant
pleural or pericardial effusion.

Hepatobiliary: There are multiple metastases throughout all segments
of the liver which demonstrate early peripheral enhancement.
Representative lesions on series 7 include a 3.2 cm lesion in the
anterior dome of the right lobe on image 14, a 3.4 cm lesion in the
far lateral segment of the left lobe on image 19, a 4.3 cm lesion in
the left lobe on image 21 and a 2.1 cm lesion more inferiorly in the
right lobe on image 40. no evidence of gallstones, gallbladder wall
thickening or biliary dilatation.

Pancreas: There is a very large, centrally necrotic mass in the left
upper quadrant of the abdomen. This measures 12.2 x 9.0 x 8.6 cm and
has thick irregular enhancing walls, central low-density and
multiple central air bubbles. This mass is located between the
liver, stomach and pancreas, and appears inseparable from the lesser
curvature of the stomach. This mass also abuts and inferiorly
displaces the pancreatic body and tail, but is not favored to
represent a primary pancreatic process. There is no pancreatic
ductal dilatation.

Spleen: Normal in size without focal abnormality.

Adrenals/Urinary Tract: Both adrenal glands appear normal. The
kidneys appear normal without evidence of urinary tract calculus,
suspicious lesion or hydronephrosis. No bladder abnormalities are
seen.

Stomach/Bowel: As above, large centrally necrotic left upper
quadrant mass along the lesser curvature of the stomach, favored to
reflect a primary gastric malignancy (likely gastrointestinal
stromal tumor). The air within this lesion may be secondary to
central necrosis and/or communication with the gastric lumen. The
small bowel, appendix and colon appear normal. There is moderate
stool throughout the colon. No evidence of bowel obstruction or
perforation.

Vascular/Lymphatic: There are no enlarged abdominal or pelvic lymph
nodes. There are multiple small lymph nodes in the gastrohepatic
ligament and retroperitoneum which are not pathologically enlarged.
Mild aortic and branch vessel atherosclerosis. No vascular
encasement. A metastasis within the caudate lobe of the liver exerts
mild mass effect on the IVC which remains patent. The portal,
superior mesenteric, splenic and hepatic veins are patent.

Reproductive: Mild enlargement of the prostate gland.

Other: Intact anterior abdominal wall. No ascites or peritoneal
nodularity.

Musculoskeletal: No acute or significant osseous findings. Mild
lumbar spondylosis.
IMPRESSION: 1. Large, centrally necrotic mass in the left upper quadrant of the
abdomen, favored to reflect a primary gastric gastrointestinal
stromal tumor. This mass is located between the liver, stomach and
pancreas but is not favored to arise from the pancreas. Gas within
the lesion may be secondary necrosis or communication with the
gastric lumen.
2. Multiple hepatic metastases. Numerous prominent lymph nodes in
the upper abdomen, not pathologically enlarged.
3. No evidence of bowel obstruction or perforation.
4. Aortic Atherosclerosis ([id number]).
